# Patient Record
Sex: Female | Born: 1945
Health system: Southern US, Community
[De-identification: ages and names within clinical notes are randomized; demographics above are authoritative.]

## PROBLEM LIST (undated history)

## (undated) DIAGNOSIS — M8580 Other specified disorders of bone density and structure, unspecified site: Secondary | ICD-10-CM

## (undated) DIAGNOSIS — R760 Raised antibody titer: Secondary | ICD-10-CM

## (undated) DIAGNOSIS — R7303 Prediabetes: Secondary | ICD-10-CM

## (undated) DIAGNOSIS — M797 Fibromyalgia: Secondary | ICD-10-CM

## (undated) DIAGNOSIS — G4700 Insomnia, unspecified: Secondary | ICD-10-CM

## (undated) DIAGNOSIS — F32A Depression, unspecified: Secondary | ICD-10-CM

## (undated) DIAGNOSIS — M1990 Unspecified osteoarthritis, unspecified site: Secondary | ICD-10-CM

## (undated) DIAGNOSIS — F419 Anxiety disorder, unspecified: Secondary | ICD-10-CM

## (undated) HISTORY — PX: BUNIONECTOMY: SHX129

## (undated) HISTORY — PX: TONSILLECTOMY: SUR1361

## (undated) HISTORY — PX: BREAST EXCISIONAL BIOPSY: SUR124

## (undated) HISTORY — PX: BREAST BIOPSY: SHX20

---

## 1993-03-08 HISTORY — PX: ABDOMINAL HYSTERECTOMY: SHX81

## 1998-03-08 HISTORY — PX: BUNIONECTOMY: SHX129

## 2011-03-09 HISTORY — PX: REPLACEMENT TOTAL KNEE: SUR1224

## 2012-03-08 HISTORY — PX: EYE SURGERY: SHX253

## 2015-03-17 DIAGNOSIS — M25561 Pain in right knee: Secondary | ICD-10-CM | POA: Diagnosis not present

## 2015-03-17 DIAGNOSIS — M170 Bilateral primary osteoarthritis of knee: Secondary | ICD-10-CM | POA: Diagnosis not present

## 2015-03-17 DIAGNOSIS — M25562 Pain in left knee: Secondary | ICD-10-CM | POA: Diagnosis not present

## 2015-03-17 DIAGNOSIS — M76891 Other specified enthesopathies of right lower limb, excluding foot: Secondary | ICD-10-CM | POA: Diagnosis not present

## 2015-03-18 DIAGNOSIS — R7309 Other abnormal glucose: Secondary | ICD-10-CM | POA: Diagnosis not present

## 2015-03-18 DIAGNOSIS — Z131 Encounter for screening for diabetes mellitus: Secondary | ICD-10-CM | POA: Diagnosis not present

## 2015-03-18 DIAGNOSIS — R740 Nonspecific elevation of levels of transaminase and lactic acid dehydrogenase [LDH]: Secondary | ICD-10-CM | POA: Diagnosis not present

## 2015-03-18 DIAGNOSIS — E669 Obesity, unspecified: Secondary | ICD-10-CM | POA: Diagnosis not present

## 2015-03-18 DIAGNOSIS — M170 Bilateral primary osteoarthritis of knee: Secondary | ICD-10-CM | POA: Diagnosis not present

## 2015-03-18 DIAGNOSIS — Z6831 Body mass index (BMI) 31.0-31.9, adult: Secondary | ICD-10-CM | POA: Diagnosis not present

## 2015-03-18 DIAGNOSIS — E7800 Pure hypercholesterolemia, unspecified: Secondary | ICD-10-CM | POA: Diagnosis not present

## 2015-07-15 DIAGNOSIS — M25562 Pain in left knee: Secondary | ICD-10-CM | POA: Diagnosis not present

## 2015-07-15 DIAGNOSIS — M76891 Other specified enthesopathies of right lower limb, excluding foot: Secondary | ICD-10-CM | POA: Diagnosis not present

## 2015-07-15 DIAGNOSIS — M170 Bilateral primary osteoarthritis of knee: Secondary | ICD-10-CM | POA: Diagnosis not present

## 2015-07-15 DIAGNOSIS — M25561 Pain in right knee: Secondary | ICD-10-CM | POA: Diagnosis not present

## 2015-07-29 DIAGNOSIS — R05 Cough: Secondary | ICD-10-CM | POA: Diagnosis not present

## 2015-07-29 DIAGNOSIS — J069 Acute upper respiratory infection, unspecified: Secondary | ICD-10-CM | POA: Diagnosis not present

## 2015-08-25 DIAGNOSIS — M76891 Other specified enthesopathies of right lower limb, excluding foot: Secondary | ICD-10-CM | POA: Diagnosis not present

## 2015-08-25 DIAGNOSIS — M25561 Pain in right knee: Secondary | ICD-10-CM | POA: Diagnosis not present

## 2015-08-28 DIAGNOSIS — M76891 Other specified enthesopathies of right lower limb, excluding foot: Secondary | ICD-10-CM | POA: Diagnosis not present

## 2015-09-02 DIAGNOSIS — M76891 Other specified enthesopathies of right lower limb, excluding foot: Secondary | ICD-10-CM | POA: Diagnosis not present

## 2015-09-04 DIAGNOSIS — M76891 Other specified enthesopathies of right lower limb, excluding foot: Secondary | ICD-10-CM | POA: Diagnosis not present

## 2015-09-12 DIAGNOSIS — M76891 Other specified enthesopathies of right lower limb, excluding foot: Secondary | ICD-10-CM | POA: Diagnosis not present

## 2015-09-15 DIAGNOSIS — E669 Obesity, unspecified: Secondary | ICD-10-CM | POA: Diagnosis not present

## 2015-09-15 DIAGNOSIS — M76891 Other specified enthesopathies of right lower limb, excluding foot: Secondary | ICD-10-CM | POA: Diagnosis not present

## 2015-09-15 DIAGNOSIS — M170 Bilateral primary osteoarthritis of knee: Secondary | ICD-10-CM | POA: Diagnosis not present

## 2015-09-15 DIAGNOSIS — M25562 Pain in left knee: Secondary | ICD-10-CM | POA: Diagnosis not present

## 2015-09-15 DIAGNOSIS — Z6833 Body mass index (BMI) 33.0-33.9, adult: Secondary | ICD-10-CM | POA: Diagnosis not present

## 2015-09-15 DIAGNOSIS — R740 Nonspecific elevation of levels of transaminase and lactic acid dehydrogenase [LDH]: Secondary | ICD-10-CM | POA: Diagnosis not present

## 2015-09-15 DIAGNOSIS — M25561 Pain in right knee: Secondary | ICD-10-CM | POA: Diagnosis not present

## 2015-09-15 DIAGNOSIS — E7800 Pure hypercholesterolemia, unspecified: Secondary | ICD-10-CM | POA: Diagnosis not present

## 2015-09-16 DIAGNOSIS — M76891 Other specified enthesopathies of right lower limb, excluding foot: Secondary | ICD-10-CM | POA: Diagnosis not present

## 2015-09-19 DIAGNOSIS — M76891 Other specified enthesopathies of right lower limb, excluding foot: Secondary | ICD-10-CM | POA: Diagnosis not present

## 2015-09-23 DIAGNOSIS — M76891 Other specified enthesopathies of right lower limb, excluding foot: Secondary | ICD-10-CM | POA: Diagnosis not present

## 2015-09-26 DIAGNOSIS — M76891 Other specified enthesopathies of right lower limb, excluding foot: Secondary | ICD-10-CM | POA: Diagnosis not present

## 2015-09-30 DIAGNOSIS — M76891 Other specified enthesopathies of right lower limb, excluding foot: Secondary | ICD-10-CM | POA: Diagnosis not present

## 2015-10-03 DIAGNOSIS — M76891 Other specified enthesopathies of right lower limb, excluding foot: Secondary | ICD-10-CM | POA: Diagnosis not present

## 2015-10-07 DIAGNOSIS — M25561 Pain in right knee: Secondary | ICD-10-CM | POA: Diagnosis not present

## 2015-10-07 DIAGNOSIS — M76891 Other specified enthesopathies of right lower limb, excluding foot: Secondary | ICD-10-CM | POA: Diagnosis not present

## 2015-12-15 DIAGNOSIS — Z23 Encounter for immunization: Secondary | ICD-10-CM | POA: Diagnosis not present

## 2015-12-17 DIAGNOSIS — Z713 Dietary counseling and surveillance: Secondary | ICD-10-CM | POA: Diagnosis not present

## 2015-12-17 DIAGNOSIS — Z6833 Body mass index (BMI) 33.0-33.9, adult: Secondary | ICD-10-CM | POA: Diagnosis not present

## 2015-12-17 DIAGNOSIS — R7309 Other abnormal glucose: Secondary | ICD-10-CM | POA: Diagnosis not present

## 2015-12-17 DIAGNOSIS — E782 Mixed hyperlipidemia: Secondary | ICD-10-CM | POA: Diagnosis not present

## 2015-12-17 DIAGNOSIS — M170 Bilateral primary osteoarthritis of knee: Secondary | ICD-10-CM | POA: Diagnosis not present

## 2016-03-04 DIAGNOSIS — Z96659 Presence of unspecified artificial knee joint: Secondary | ICD-10-CM | POA: Diagnosis not present

## 2016-03-04 DIAGNOSIS — Z96652 Presence of left artificial knee joint: Secondary | ICD-10-CM | POA: Diagnosis not present

## 2016-03-04 DIAGNOSIS — M1712 Unilateral primary osteoarthritis, left knee: Secondary | ICD-10-CM | POA: Diagnosis not present

## 2016-03-04 DIAGNOSIS — M7051 Other bursitis of knee, right knee: Secondary | ICD-10-CM | POA: Diagnosis not present

## 2016-03-04 DIAGNOSIS — T8484XA Pain due to internal orthopedic prosthetic devices, implants and grafts, initial encounter: Secondary | ICD-10-CM | POA: Diagnosis not present

## 2016-03-11 DIAGNOSIS — E785 Hyperlipidemia, unspecified: Secondary | ICD-10-CM | POA: Diagnosis not present

## 2016-03-11 DIAGNOSIS — M1990 Unspecified osteoarthritis, unspecified site: Secondary | ICD-10-CM | POA: Diagnosis not present

## 2016-03-11 DIAGNOSIS — Z0000 Encounter for general adult medical examination without abnormal findings: Secondary | ICD-10-CM | POA: Diagnosis not present

## 2016-03-22 DIAGNOSIS — Z13820 Encounter for screening for osteoporosis: Secondary | ICD-10-CM | POA: Diagnosis not present

## 2016-03-22 DIAGNOSIS — Z780 Asymptomatic menopausal state: Secondary | ICD-10-CM | POA: Diagnosis not present

## 2016-03-22 DIAGNOSIS — M8588 Other specified disorders of bone density and structure, other site: Secondary | ICD-10-CM | POA: Diagnosis not present

## 2016-04-16 DIAGNOSIS — Z96651 Presence of right artificial knee joint: Secondary | ICD-10-CM | POA: Diagnosis not present

## 2016-04-16 DIAGNOSIS — M1712 Unilateral primary osteoarthritis, left knee: Secondary | ICD-10-CM | POA: Diagnosis not present

## 2016-04-23 DIAGNOSIS — M25561 Pain in right knee: Secondary | ICD-10-CM | POA: Diagnosis not present

## 2016-04-23 DIAGNOSIS — M1712 Unilateral primary osteoarthritis, left knee: Secondary | ICD-10-CM | POA: Diagnosis not present

## 2016-04-30 DIAGNOSIS — M1712 Unilateral primary osteoarthritis, left knee: Secondary | ICD-10-CM | POA: Diagnosis not present

## 2016-05-10 DIAGNOSIS — E7800 Pure hypercholesterolemia, unspecified: Secondary | ICD-10-CM | POA: Diagnosis not present

## 2016-06-01 DIAGNOSIS — T8484XA Pain due to internal orthopedic prosthetic devices, implants and grafts, initial encounter: Secondary | ICD-10-CM | POA: Diagnosis not present

## 2016-06-01 DIAGNOSIS — M7051 Other bursitis of knee, right knee: Secondary | ICD-10-CM | POA: Diagnosis not present

## 2016-06-01 DIAGNOSIS — Z96651 Presence of right artificial knee joint: Secondary | ICD-10-CM | POA: Diagnosis not present

## 2016-06-01 DIAGNOSIS — M1712 Unilateral primary osteoarthritis, left knee: Secondary | ICD-10-CM | POA: Diagnosis not present

## 2016-06-03 ENCOUNTER — Other Ambulatory Visit (HOSPITAL_COMMUNITY): Payer: Self-pay | Admitting: Orthopedic Surgery

## 2016-06-03 DIAGNOSIS — T8484XA Pain due to internal orthopedic prosthetic devices, implants and grafts, initial encounter: Secondary | ICD-10-CM

## 2016-06-03 DIAGNOSIS — Z96651 Presence of right artificial knee joint: Secondary | ICD-10-CM

## 2016-06-11 ENCOUNTER — Encounter (HOSPITAL_COMMUNITY)
Admission: RE | Admit: 2016-06-11 | Discharge: 2016-06-11 | Disposition: A | Discharge status: Home / Self Care | Payer: Medicare Other | Source: Ambulatory Visit | Attending: Orthopedic Surgery | Admitting: Orthopedic Surgery

## 2016-06-11 ENCOUNTER — Encounter (HOSPITAL_COMMUNITY): Payer: Self-pay | Admitting: Radiology

## 2016-06-11 DIAGNOSIS — T8484XA Pain due to internal orthopedic prosthetic devices, implants and grafts, initial encounter: Secondary | ICD-10-CM | POA: Diagnosis not present

## 2016-06-11 DIAGNOSIS — X58XXXA Exposure to other specified factors, initial encounter: Secondary | ICD-10-CM | POA: Insufficient documentation

## 2016-06-11 DIAGNOSIS — Z96651 Presence of right artificial knee joint: Secondary | ICD-10-CM | POA: Diagnosis not present

## 2016-06-11 DIAGNOSIS — Z471 Aftercare following joint replacement surgery: Secondary | ICD-10-CM | POA: Diagnosis not present

## 2016-06-11 MED ORDER — TECHNETIUM TC 99M MEDRONATE IV KIT
25.0000 | PACK | Freq: Once | INTRAVENOUS | Status: AC | PRN
  Administered 2016-06-11: 25 via INTRAVENOUS

## 2016-07-05 DIAGNOSIS — E663 Overweight: Secondary | ICD-10-CM | POA: Diagnosis not present

## 2016-07-05 DIAGNOSIS — Z8659 Personal history of other mental and behavioral disorders: Secondary | ICD-10-CM | POA: Diagnosis not present

## 2016-07-05 DIAGNOSIS — M1990 Unspecified osteoarthritis, unspecified site: Secondary | ICD-10-CM | POA: Diagnosis not present

## 2016-07-05 DIAGNOSIS — E785 Hyperlipidemia, unspecified: Secondary | ICD-10-CM | POA: Diagnosis not present

## 2016-11-29 DIAGNOSIS — Z23 Encounter for immunization: Secondary | ICD-10-CM | POA: Diagnosis not present

## 2016-12-24 DIAGNOSIS — M1712 Unilateral primary osteoarthritis, left knee: Secondary | ICD-10-CM | POA: Diagnosis not present

## 2016-12-31 DIAGNOSIS — M1712 Unilateral primary osteoarthritis, left knee: Secondary | ICD-10-CM | POA: Diagnosis not present

## 2017-03-22 DIAGNOSIS — Z8659 Personal history of other mental and behavioral disorders: Secondary | ICD-10-CM | POA: Diagnosis not present

## 2017-03-22 DIAGNOSIS — E785 Hyperlipidemia, unspecified: Secondary | ICD-10-CM | POA: Diagnosis not present

## 2017-03-22 DIAGNOSIS — M1990 Unspecified osteoarthritis, unspecified site: Secondary | ICD-10-CM | POA: Diagnosis not present

## 2017-04-14 DIAGNOSIS — J069 Acute upper respiratory infection, unspecified: Secondary | ICD-10-CM | POA: Diagnosis not present

## 2017-07-14 DIAGNOSIS — M1712 Unilateral primary osteoarthritis, left knee: Secondary | ICD-10-CM | POA: Diagnosis not present

## 2017-07-14 DIAGNOSIS — M25562 Pain in left knee: Secondary | ICD-10-CM | POA: Diagnosis not present

## 2017-07-21 DIAGNOSIS — M1712 Unilateral primary osteoarthritis, left knee: Secondary | ICD-10-CM | POA: Diagnosis not present

## 2017-07-29 DIAGNOSIS — M170 Bilateral primary osteoarthritis of knee: Secondary | ICD-10-CM | POA: Diagnosis not present

## 2017-08-02 DIAGNOSIS — F4323 Adjustment disorder with mixed anxiety and depressed mood: Secondary | ICD-10-CM | POA: Diagnosis not present

## 2017-09-20 ENCOUNTER — Other Ambulatory Visit: Payer: Self-pay | Admitting: Family Medicine

## 2017-09-20 ENCOUNTER — Ambulatory Visit
Admission: RE | Admit: 2017-09-20 | Discharge: 2017-09-20 | Disposition: A | Discharge status: Home / Self Care | Payer: Medicare Other | Source: Ambulatory Visit | Attending: Family Medicine | Admitting: Family Medicine

## 2017-09-20 DIAGNOSIS — R011 Cardiac murmur, unspecified: Secondary | ICD-10-CM | POA: Diagnosis not present

## 2017-09-20 DIAGNOSIS — M1990 Unspecified osteoarthritis, unspecified site: Secondary | ICD-10-CM | POA: Diagnosis not present

## 2017-09-20 DIAGNOSIS — E785 Hyperlipidemia, unspecified: Secondary | ICD-10-CM | POA: Diagnosis not present

## 2017-09-20 DIAGNOSIS — F321 Major depressive disorder, single episode, moderate: Secondary | ICD-10-CM | POA: Diagnosis not present

## 2017-09-20 DIAGNOSIS — R52 Pain, unspecified: Secondary | ICD-10-CM

## 2017-09-20 DIAGNOSIS — S99921A Unspecified injury of right foot, initial encounter: Secondary | ICD-10-CM | POA: Diagnosis not present

## 2017-09-20 DIAGNOSIS — M79671 Pain in right foot: Secondary | ICD-10-CM | POA: Diagnosis not present

## 2017-11-14 DIAGNOSIS — Z23 Encounter for immunization: Secondary | ICD-10-CM | POA: Diagnosis not present

## 2017-12-26 DIAGNOSIS — M79671 Pain in right foot: Secondary | ICD-10-CM | POA: Diagnosis not present

## 2017-12-26 DIAGNOSIS — M79641 Pain in right hand: Secondary | ICD-10-CM | POA: Diagnosis not present

## 2018-02-01 DIAGNOSIS — M1712 Unilateral primary osteoarthritis, left knee: Secondary | ICD-10-CM | POA: Diagnosis not present

## 2018-02-08 DIAGNOSIS — M1712 Unilateral primary osteoarthritis, left knee: Secondary | ICD-10-CM | POA: Diagnosis not present

## 2018-02-15 DIAGNOSIS — M1712 Unilateral primary osteoarthritis, left knee: Secondary | ICD-10-CM | POA: Diagnosis not present

## 2018-03-24 DIAGNOSIS — R011 Cardiac murmur, unspecified: Secondary | ICD-10-CM | POA: Diagnosis not present

## 2018-03-24 DIAGNOSIS — Z23 Encounter for immunization: Secondary | ICD-10-CM | POA: Diagnosis not present

## 2018-03-24 DIAGNOSIS — Z1211 Encounter for screening for malignant neoplasm of colon: Secondary | ICD-10-CM | POA: Diagnosis not present

## 2018-03-24 DIAGNOSIS — Z0000 Encounter for general adult medical examination without abnormal findings: Secondary | ICD-10-CM | POA: Diagnosis not present

## 2018-03-24 DIAGNOSIS — E669 Obesity, unspecified: Secondary | ICD-10-CM | POA: Diagnosis not present

## 2018-03-24 DIAGNOSIS — M8580 Other specified disorders of bone density and structure, unspecified site: Secondary | ICD-10-CM | POA: Diagnosis not present

## 2018-03-24 DIAGNOSIS — F321 Major depressive disorder, single episode, moderate: Secondary | ICD-10-CM | POA: Diagnosis not present

## 2018-03-24 DIAGNOSIS — E785 Hyperlipidemia, unspecified: Secondary | ICD-10-CM | POA: Diagnosis not present

## 2018-03-24 DIAGNOSIS — Z1389 Encounter for screening for other disorder: Secondary | ICD-10-CM | POA: Diagnosis not present

## 2018-03-24 DIAGNOSIS — M1990 Unspecified osteoarthritis, unspecified site: Secondary | ICD-10-CM | POA: Diagnosis not present

## 2018-03-24 DIAGNOSIS — E2839 Other primary ovarian failure: Secondary | ICD-10-CM | POA: Diagnosis not present

## 2018-03-27 ENCOUNTER — Other Ambulatory Visit: Payer: Self-pay | Admitting: Family Medicine

## 2018-03-27 DIAGNOSIS — Z1231 Encounter for screening mammogram for malignant neoplasm of breast: Secondary | ICD-10-CM

## 2018-03-27 DIAGNOSIS — E2839 Other primary ovarian failure: Secondary | ICD-10-CM

## 2018-04-03 DIAGNOSIS — M6740 Ganglion, unspecified site: Secondary | ICD-10-CM | POA: Diagnosis not present

## 2018-04-11 DIAGNOSIS — M19012 Primary osteoarthritis, left shoulder: Secondary | ICD-10-CM | POA: Diagnosis not present

## 2018-04-11 DIAGNOSIS — M25512 Pain in left shoulder: Secondary | ICD-10-CM | POA: Diagnosis not present

## 2018-06-02 ENCOUNTER — Other Ambulatory Visit: Payer: Medicare Other

## 2018-06-02 ENCOUNTER — Ambulatory Visit: Payer: Medicare Other

## 2018-08-07 ENCOUNTER — Other Ambulatory Visit: Payer: Self-pay

## 2018-08-07 ENCOUNTER — Ambulatory Visit
Admission: RE | Admit: 2018-08-07 | Discharge: 2018-08-07 | Disposition: A | Discharge status: Home / Self Care | Payer: Medicare Other | Source: Ambulatory Visit | Attending: Family Medicine | Admitting: Family Medicine

## 2018-08-07 ENCOUNTER — Ambulatory Visit: Payer: Medicare Other

## 2018-08-07 DIAGNOSIS — Z1231 Encounter for screening mammogram for malignant neoplasm of breast: Secondary | ICD-10-CM

## 2018-08-07 DIAGNOSIS — E2839 Other primary ovarian failure: Secondary | ICD-10-CM

## 2018-08-07 DIAGNOSIS — Z780 Asymptomatic menopausal state: Secondary | ICD-10-CM | POA: Diagnosis not present

## 2018-08-07 DIAGNOSIS — M8589 Other specified disorders of bone density and structure, multiple sites: Secondary | ICD-10-CM | POA: Diagnosis not present

## 2018-10-04 DIAGNOSIS — M1712 Unilateral primary osteoarthritis, left knee: Secondary | ICD-10-CM | POA: Diagnosis not present

## 2018-10-06 DIAGNOSIS — M8580 Other specified disorders of bone density and structure, unspecified site: Secondary | ICD-10-CM | POA: Diagnosis not present

## 2018-10-06 DIAGNOSIS — E785 Hyperlipidemia, unspecified: Secondary | ICD-10-CM | POA: Diagnosis not present

## 2018-10-06 DIAGNOSIS — M1990 Unspecified osteoarthritis, unspecified site: Secondary | ICD-10-CM | POA: Diagnosis not present

## 2018-10-06 DIAGNOSIS — F321 Major depressive disorder, single episode, moderate: Secondary | ICD-10-CM | POA: Diagnosis not present

## 2018-10-06 DIAGNOSIS — E669 Obesity, unspecified: Secondary | ICD-10-CM | POA: Diagnosis not present

## 2018-10-11 DIAGNOSIS — M1712 Unilateral primary osteoarthritis, left knee: Secondary | ICD-10-CM | POA: Diagnosis not present

## 2018-10-11 DIAGNOSIS — M25562 Pain in left knee: Secondary | ICD-10-CM | POA: Diagnosis not present

## 2018-10-18 DIAGNOSIS — M25562 Pain in left knee: Secondary | ICD-10-CM | POA: Diagnosis not present

## 2018-10-18 DIAGNOSIS — M1712 Unilateral primary osteoarthritis, left knee: Secondary | ICD-10-CM | POA: Diagnosis not present

## 2018-11-15 DIAGNOSIS — Z23 Encounter for immunization: Secondary | ICD-10-CM | POA: Diagnosis not present

## 2019-02-09 DIAGNOSIS — F321 Major depressive disorder, single episode, moderate: Secondary | ICD-10-CM | POA: Diagnosis not present

## 2019-02-09 DIAGNOSIS — E7800 Pure hypercholesterolemia, unspecified: Secondary | ICD-10-CM | POA: Diagnosis not present

## 2019-02-09 DIAGNOSIS — F324 Major depressive disorder, single episode, in partial remission: Secondary | ICD-10-CM | POA: Diagnosis not present

## 2019-02-09 DIAGNOSIS — M8580 Other specified disorders of bone density and structure, unspecified site: Secondary | ICD-10-CM | POA: Diagnosis not present

## 2019-02-09 DIAGNOSIS — M1990 Unspecified osteoarthritis, unspecified site: Secondary | ICD-10-CM | POA: Diagnosis not present

## 2019-02-09 DIAGNOSIS — E785 Hyperlipidemia, unspecified: Secondary | ICD-10-CM | POA: Diagnosis not present

## 2019-03-05 ENCOUNTER — Ambulatory Visit: Payer: Medicare Other | Attending: Internal Medicine

## 2019-03-05 DIAGNOSIS — U071 COVID-19: Secondary | ICD-10-CM

## 2019-03-05 DIAGNOSIS — R238 Other skin changes: Secondary | ICD-10-CM

## 2019-03-07 LAB — NOVEL CORONAVIRUS, NAA: SARS-CoV-2, NAA: NOT DETECTED

## 2019-04-05 ENCOUNTER — Ambulatory Visit: Payer: Medicare Other

## 2019-04-11 DIAGNOSIS — H04122 Dry eye syndrome of left lacrimal gland: Secondary | ICD-10-CM | POA: Diagnosis not present

## 2019-04-13 ENCOUNTER — Ambulatory Visit: Payer: Medicare Other | Attending: Internal Medicine

## 2019-04-13 DIAGNOSIS — Z23 Encounter for immunization: Secondary | ICD-10-CM | POA: Insufficient documentation

## 2019-04-13 NOTE — Progress Notes (Signed)
   Covid-19 Vaccination Clinic  Name:  Morgan Reed    MRN: [id number] DOB: 1946-01-04  04/13/2019  [name] was observed post Covid-19 immunization for 15 minutes without incidence. She was provided with Vaccine Information Sheet and instruction to access the V-Safe system.   [name] was instructed to call 911 with any severe reactions post vaccine: Marland Kitchen Difficulty breathing  . Swelling of your face and throat  . A fast heartbeat  . A bad rash all over your body  . Dizziness and weakness    Immunizations Administered    Name Date Dose VIS Date Route   Pfizer COVID-19 Vaccine 04/13/2019  5:46 PM 0.3 mL 02/16/2019 Intramuscular   Manufacturer: [employer], [employer]   Lot: [id number]   NDC: [address]-7

## 2019-04-20 DIAGNOSIS — M1712 Unilateral primary osteoarthritis, left knee: Secondary | ICD-10-CM | POA: Diagnosis not present

## 2019-04-20 DIAGNOSIS — M25561 Pain in right knee: Secondary | ICD-10-CM | POA: Diagnosis not present

## 2019-04-20 DIAGNOSIS — M25562 Pain in left knee: Secondary | ICD-10-CM | POA: Diagnosis not present

## 2019-04-26 ENCOUNTER — Ambulatory Visit: Payer: Medicare Other

## 2019-04-27 DIAGNOSIS — M1712 Unilateral primary osteoarthritis, left knee: Secondary | ICD-10-CM | POA: Diagnosis not present

## 2019-04-27 DIAGNOSIS — M25562 Pain in left knee: Secondary | ICD-10-CM | POA: Diagnosis not present

## 2019-04-27 DIAGNOSIS — M1711 Unilateral primary osteoarthritis, right knee: Secondary | ICD-10-CM | POA: Diagnosis not present

## 2019-04-27 DIAGNOSIS — M25561 Pain in right knee: Secondary | ICD-10-CM | POA: Diagnosis not present

## 2019-05-04 DIAGNOSIS — M1712 Unilateral primary osteoarthritis, left knee: Secondary | ICD-10-CM | POA: Diagnosis not present

## 2019-05-04 DIAGNOSIS — M25562 Pain in left knee: Secondary | ICD-10-CM | POA: Diagnosis not present

## 2019-05-08 ENCOUNTER — Ambulatory Visit: Payer: Medicare Other

## 2019-05-08 ENCOUNTER — Ambulatory Visit: Payer: Medicare Other | Attending: Internal Medicine

## 2019-05-08 DIAGNOSIS — Z23 Encounter for immunization: Secondary | ICD-10-CM

## 2019-05-08 NOTE — Progress Notes (Signed)
   Covid-19 Vaccination Clinic  Name:  Morgan Reed    MRN: [id number] DOB: 20-Aug-1945  05/08/2019  [name] was observed post Covid-19 immunization for 15 minutes without incident. She was provided with Vaccine Information Sheet and instruction to access the V-Safe system.   Ms. Gutter was instructed to call 911 with any severe reactions post vaccine: Marland Kitchen Difficulty breathing  . Swelling of face and throat  . A fast heartbeat  . A bad rash all over body  . Dizziness and weakness   Immunizations Administered    Name Date Dose VIS Date Route   Pfizer COVID-19 Vaccine 05/08/2019 10:19 AM 0.3 mL 02/16/2019 Intramuscular   Manufacturer: [employer], [employer]   Lot: [id number]   NDC: [address]-3

## 2019-06-20 DIAGNOSIS — H0011 Chalazion right upper eyelid: Secondary | ICD-10-CM | POA: Diagnosis not present

## 2019-06-27 DIAGNOSIS — M1990 Unspecified osteoarthritis, unspecified site: Secondary | ICD-10-CM | POA: Diagnosis not present

## 2019-06-27 DIAGNOSIS — Z0000 Encounter for general adult medical examination without abnormal findings: Secondary | ICD-10-CM | POA: Diagnosis not present

## 2019-06-27 DIAGNOSIS — G4700 Insomnia, unspecified: Secondary | ICD-10-CM | POA: Diagnosis not present

## 2019-06-27 DIAGNOSIS — F321 Major depressive disorder, single episode, moderate: Secondary | ICD-10-CM | POA: Diagnosis not present

## 2019-06-27 DIAGNOSIS — E785 Hyperlipidemia, unspecified: Secondary | ICD-10-CM | POA: Diagnosis not present

## 2019-06-27 DIAGNOSIS — M8580 Other specified disorders of bone density and structure, unspecified site: Secondary | ICD-10-CM | POA: Diagnosis not present

## 2019-06-27 DIAGNOSIS — Z1389 Encounter for screening for other disorder: Secondary | ICD-10-CM | POA: Diagnosis not present

## 2019-07-04 DIAGNOSIS — Z1211 Encounter for screening for malignant neoplasm of colon: Secondary | ICD-10-CM | POA: Diagnosis not present

## 2019-07-10 DIAGNOSIS — H0011 Chalazion right upper eyelid: Secondary | ICD-10-CM | POA: Diagnosis not present

## 2019-07-12 DIAGNOSIS — R944 Abnormal results of kidney function studies: Secondary | ICD-10-CM | POA: Diagnosis not present

## 2019-07-12 DIAGNOSIS — Z23 Encounter for immunization: Secondary | ICD-10-CM | POA: Diagnosis not present

## 2019-08-09 ENCOUNTER — Other Ambulatory Visit: Payer: Self-pay | Admitting: Family Medicine

## 2019-08-09 DIAGNOSIS — Z1231 Encounter for screening mammogram for malignant neoplasm of breast: Secondary | ICD-10-CM

## 2019-08-15 ENCOUNTER — Other Ambulatory Visit: Payer: Self-pay

## 2019-08-15 ENCOUNTER — Ambulatory Visit
Admission: RE | Admit: 2019-08-15 | Discharge: 2019-08-15 | Disposition: A | Discharge status: Home / Self Care | Payer: Medicare Other | Source: Ambulatory Visit | Attending: Family Medicine | Admitting: Family Medicine

## 2019-08-15 DIAGNOSIS — Z1231 Encounter for screening mammogram for malignant neoplasm of breast: Secondary | ICD-10-CM | POA: Diagnosis not present

## 2019-08-20 DIAGNOSIS — H0014 Chalazion left upper eyelid: Secondary | ICD-10-CM | POA: Diagnosis not present

## 2019-08-20 DIAGNOSIS — H01004 Unspecified blepharitis left upper eyelid: Secondary | ICD-10-CM | POA: Diagnosis not present

## 2019-08-20 DIAGNOSIS — H01001 Unspecified blepharitis right upper eyelid: Secondary | ICD-10-CM | POA: Diagnosis not present

## 2019-09-21 DIAGNOSIS — Z96651 Presence of right artificial knee joint: Secondary | ICD-10-CM | POA: Diagnosis not present

## 2019-09-21 DIAGNOSIS — M25561 Pain in right knee: Secondary | ICD-10-CM | POA: Diagnosis not present

## 2019-10-05 DIAGNOSIS — E785 Hyperlipidemia, unspecified: Secondary | ICD-10-CM | POA: Diagnosis not present

## 2019-10-05 DIAGNOSIS — M1990 Unspecified osteoarthritis, unspecified site: Secondary | ICD-10-CM | POA: Diagnosis not present

## 2019-10-05 DIAGNOSIS — F321 Major depressive disorder, single episode, moderate: Secondary | ICD-10-CM | POA: Diagnosis not present

## 2019-10-05 DIAGNOSIS — F324 Major depressive disorder, single episode, in partial remission: Secondary | ICD-10-CM | POA: Diagnosis not present

## 2019-10-05 DIAGNOSIS — E7800 Pure hypercholesterolemia, unspecified: Secondary | ICD-10-CM | POA: Diagnosis not present

## 2019-10-05 DIAGNOSIS — M8580 Other specified disorders of bone density and structure, unspecified site: Secondary | ICD-10-CM | POA: Diagnosis not present

## 2019-10-30 DIAGNOSIS — N289 Disorder of kidney and ureter, unspecified: Secondary | ICD-10-CM | POA: Diagnosis not present

## 2019-10-30 DIAGNOSIS — Z23 Encounter for immunization: Secondary | ICD-10-CM | POA: Diagnosis not present

## 2019-11-07 DIAGNOSIS — M1712 Unilateral primary osteoarthritis, left knee: Secondary | ICD-10-CM | POA: Diagnosis not present

## 2019-11-14 DIAGNOSIS — M25562 Pain in left knee: Secondary | ICD-10-CM | POA: Diagnosis not present

## 2019-11-14 DIAGNOSIS — M1712 Unilateral primary osteoarthritis, left knee: Secondary | ICD-10-CM | POA: Diagnosis not present

## 2019-11-19 DIAGNOSIS — Z23 Encounter for immunization: Secondary | ICD-10-CM | POA: Diagnosis not present

## 2019-11-21 DIAGNOSIS — M25562 Pain in left knee: Secondary | ICD-10-CM | POA: Diagnosis not present

## 2019-11-21 DIAGNOSIS — M1712 Unilateral primary osteoarthritis, left knee: Secondary | ICD-10-CM | POA: Diagnosis not present

## 2019-11-29 DIAGNOSIS — F324 Major depressive disorder, single episode, in partial remission: Secondary | ICD-10-CM | POA: Diagnosis not present

## 2019-11-29 DIAGNOSIS — E785 Hyperlipidemia, unspecified: Secondary | ICD-10-CM | POA: Diagnosis not present

## 2019-11-29 DIAGNOSIS — M1990 Unspecified osteoarthritis, unspecified site: Secondary | ICD-10-CM | POA: Diagnosis not present

## 2019-11-29 DIAGNOSIS — M8580 Other specified disorders of bone density and structure, unspecified site: Secondary | ICD-10-CM | POA: Diagnosis not present

## 2019-11-29 DIAGNOSIS — F321 Major depressive disorder, single episode, moderate: Secondary | ICD-10-CM | POA: Diagnosis not present

## 2019-11-29 DIAGNOSIS — E7800 Pure hypercholesterolemia, unspecified: Secondary | ICD-10-CM | POA: Diagnosis not present

## 2019-12-03 DIAGNOSIS — Z23 Encounter for immunization: Secondary | ICD-10-CM | POA: Diagnosis not present

## 2019-12-26 DIAGNOSIS — M25561 Pain in right knee: Secondary | ICD-10-CM | POA: Diagnosis not present

## 2019-12-26 DIAGNOSIS — Z96651 Presence of right artificial knee joint: Secondary | ICD-10-CM | POA: Diagnosis not present

## 2019-12-26 DIAGNOSIS — M2241 Chondromalacia patellae, right knee: Secondary | ICD-10-CM | POA: Diagnosis not present

## 2019-12-26 DIAGNOSIS — T8484XA Pain due to internal orthopedic prosthetic devices, implants and grafts, initial encounter: Secondary | ICD-10-CM | POA: Diagnosis not present

## 2020-02-04 DIAGNOSIS — Z20828 Contact with and (suspected) exposure to other viral communicable diseases: Secondary | ICD-10-CM | POA: Diagnosis not present

## 2020-02-04 DIAGNOSIS — B349 Viral infection, unspecified: Secondary | ICD-10-CM | POA: Diagnosis not present

## 2020-02-20 DIAGNOSIS — G4700 Insomnia, unspecified: Secondary | ICD-10-CM | POA: Diagnosis not present

## 2020-02-20 DIAGNOSIS — E785 Hyperlipidemia, unspecified: Secondary | ICD-10-CM | POA: Diagnosis not present

## 2020-02-20 DIAGNOSIS — E669 Obesity, unspecified: Secondary | ICD-10-CM | POA: Diagnosis not present

## 2020-02-20 DIAGNOSIS — M1990 Unspecified osteoarthritis, unspecified site: Secondary | ICD-10-CM | POA: Diagnosis not present

## 2020-02-20 DIAGNOSIS — F321 Major depressive disorder, single episode, moderate: Secondary | ICD-10-CM | POA: Diagnosis not present

## 2020-03-04 DIAGNOSIS — G4700 Insomnia, unspecified: Secondary | ICD-10-CM | POA: Diagnosis not present

## 2020-03-04 DIAGNOSIS — E7800 Pure hypercholesterolemia, unspecified: Secondary | ICD-10-CM | POA: Diagnosis not present

## 2020-03-04 DIAGNOSIS — M8580 Other specified disorders of bone density and structure, unspecified site: Secondary | ICD-10-CM | POA: Diagnosis not present

## 2020-03-04 DIAGNOSIS — E785 Hyperlipidemia, unspecified: Secondary | ICD-10-CM | POA: Diagnosis not present

## 2020-03-04 DIAGNOSIS — M1990 Unspecified osteoarthritis, unspecified site: Secondary | ICD-10-CM | POA: Diagnosis not present

## 2020-03-04 DIAGNOSIS — F324 Major depressive disorder, single episode, in partial remission: Secondary | ICD-10-CM | POA: Diagnosis not present

## 2020-03-04 DIAGNOSIS — F321 Major depressive disorder, single episode, moderate: Secondary | ICD-10-CM | POA: Diagnosis not present

## 2020-03-26 DIAGNOSIS — Z961 Presence of intraocular lens: Secondary | ICD-10-CM | POA: Diagnosis not present

## 2020-03-26 DIAGNOSIS — H01001 Unspecified blepharitis right upper eyelid: Secondary | ICD-10-CM | POA: Diagnosis not present

## 2020-03-26 DIAGNOSIS — H5211 Myopia, right eye: Secondary | ICD-10-CM | POA: Diagnosis not present

## 2020-03-26 DIAGNOSIS — H26493 Other secondary cataract, bilateral: Secondary | ICD-10-CM | POA: Diagnosis not present

## 2020-04-03 DIAGNOSIS — M170 Bilateral primary osteoarthritis of knee: Secondary | ICD-10-CM | POA: Diagnosis not present

## 2020-04-03 DIAGNOSIS — M1712 Unilateral primary osteoarthritis, left knee: Secondary | ICD-10-CM | POA: Diagnosis not present

## 2020-04-03 DIAGNOSIS — M25552 Pain in left hip: Secondary | ICD-10-CM | POA: Diagnosis not present

## 2020-04-03 DIAGNOSIS — M25562 Pain in left knee: Secondary | ICD-10-CM | POA: Diagnosis not present

## 2020-05-08 DIAGNOSIS — F324 Major depressive disorder, single episode, in partial remission: Secondary | ICD-10-CM | POA: Diagnosis not present

## 2020-05-08 DIAGNOSIS — M8580 Other specified disorders of bone density and structure, unspecified site: Secondary | ICD-10-CM | POA: Diagnosis not present

## 2020-05-08 DIAGNOSIS — E7800 Pure hypercholesterolemia, unspecified: Secondary | ICD-10-CM | POA: Diagnosis not present

## 2020-05-08 DIAGNOSIS — M1990 Unspecified osteoarthritis, unspecified site: Secondary | ICD-10-CM | POA: Diagnosis not present

## 2020-05-08 DIAGNOSIS — G4700 Insomnia, unspecified: Secondary | ICD-10-CM | POA: Diagnosis not present

## 2020-05-08 DIAGNOSIS — E785 Hyperlipidemia, unspecified: Secondary | ICD-10-CM | POA: Diagnosis not present

## 2020-05-21 DIAGNOSIS — M1712 Unilateral primary osteoarthritis, left knee: Secondary | ICD-10-CM | POA: Diagnosis not present

## 2020-05-21 DIAGNOSIS — M25562 Pain in left knee: Secondary | ICD-10-CM | POA: Diagnosis not present

## 2020-05-28 DIAGNOSIS — M1712 Unilateral primary osteoarthritis, left knee: Secondary | ICD-10-CM | POA: Diagnosis not present

## 2020-06-04 DIAGNOSIS — M25562 Pain in left knee: Secondary | ICD-10-CM | POA: Diagnosis not present

## 2020-06-04 DIAGNOSIS — M1712 Unilateral primary osteoarthritis, left knee: Secondary | ICD-10-CM | POA: Diagnosis not present

## 2020-06-27 DIAGNOSIS — F321 Major depressive disorder, single episode, moderate: Secondary | ICD-10-CM | POA: Diagnosis not present

## 2020-06-27 DIAGNOSIS — Z0000 Encounter for general adult medical examination without abnormal findings: Secondary | ICD-10-CM | POA: Diagnosis not present

## 2020-06-27 DIAGNOSIS — E2839 Other primary ovarian failure: Secondary | ICD-10-CM | POA: Diagnosis not present

## 2020-06-27 DIAGNOSIS — Z1389 Encounter for screening for other disorder: Secondary | ICD-10-CM | POA: Diagnosis not present

## 2020-06-27 DIAGNOSIS — Z1211 Encounter for screening for malignant neoplasm of colon: Secondary | ICD-10-CM | POA: Diagnosis not present

## 2020-06-27 DIAGNOSIS — E669 Obesity, unspecified: Secondary | ICD-10-CM | POA: Diagnosis not present

## 2020-06-27 DIAGNOSIS — M8580 Other specified disorders of bone density and structure, unspecified site: Secondary | ICD-10-CM | POA: Diagnosis not present

## 2020-06-27 DIAGNOSIS — G4700 Insomnia, unspecified: Secondary | ICD-10-CM | POA: Diagnosis not present

## 2020-06-27 DIAGNOSIS — M1990 Unspecified osteoarthritis, unspecified site: Secondary | ICD-10-CM | POA: Diagnosis not present

## 2020-06-27 DIAGNOSIS — E785 Hyperlipidemia, unspecified: Secondary | ICD-10-CM | POA: Diagnosis not present

## 2020-06-30 DIAGNOSIS — Z1211 Encounter for screening for malignant neoplasm of colon: Secondary | ICD-10-CM | POA: Diagnosis not present

## 2020-06-30 DIAGNOSIS — Z23 Encounter for immunization: Secondary | ICD-10-CM | POA: Diagnosis not present

## 2020-07-01 ENCOUNTER — Other Ambulatory Visit: Payer: Self-pay | Admitting: Family Medicine

## 2020-07-01 DIAGNOSIS — E2839 Other primary ovarian failure: Secondary | ICD-10-CM

## 2020-07-02 DIAGNOSIS — M170 Bilateral primary osteoarthritis of knee: Secondary | ICD-10-CM | POA: Diagnosis not present

## 2020-07-07 DIAGNOSIS — F324 Major depressive disorder, single episode, in partial remission: Secondary | ICD-10-CM | POA: Diagnosis not present

## 2020-07-07 DIAGNOSIS — E785 Hyperlipidemia, unspecified: Secondary | ICD-10-CM | POA: Diagnosis not present

## 2020-07-07 DIAGNOSIS — M8580 Other specified disorders of bone density and structure, unspecified site: Secondary | ICD-10-CM | POA: Diagnosis not present

## 2020-07-07 DIAGNOSIS — G4700 Insomnia, unspecified: Secondary | ICD-10-CM | POA: Diagnosis not present

## 2020-07-07 DIAGNOSIS — M1990 Unspecified osteoarthritis, unspecified site: Secondary | ICD-10-CM | POA: Diagnosis not present

## 2020-07-07 DIAGNOSIS — E7800 Pure hypercholesterolemia, unspecified: Secondary | ICD-10-CM | POA: Diagnosis not present

## 2020-07-10 DIAGNOSIS — Z1211 Encounter for screening for malignant neoplasm of colon: Secondary | ICD-10-CM | POA: Diagnosis not present

## 2020-07-17 DIAGNOSIS — R7401 Elevation of levels of liver transaminase levels: Secondary | ICD-10-CM | POA: Diagnosis not present

## 2020-07-24 ENCOUNTER — Other Ambulatory Visit: Payer: Self-pay | Admitting: Family Medicine

## 2020-07-24 DIAGNOSIS — Z1231 Encounter for screening mammogram for malignant neoplasm of breast: Secondary | ICD-10-CM

## 2020-08-20 DIAGNOSIS — E785 Hyperlipidemia, unspecified: Secondary | ICD-10-CM | POA: Diagnosis not present

## 2020-08-20 DIAGNOSIS — G4700 Insomnia, unspecified: Secondary | ICD-10-CM | POA: Diagnosis not present

## 2020-08-20 DIAGNOSIS — F324 Major depressive disorder, single episode, in partial remission: Secondary | ICD-10-CM | POA: Diagnosis not present

## 2020-08-20 DIAGNOSIS — M8580 Other specified disorders of bone density and structure, unspecified site: Secondary | ICD-10-CM | POA: Diagnosis not present

## 2020-08-20 DIAGNOSIS — M1990 Unspecified osteoarthritis, unspecified site: Secondary | ICD-10-CM | POA: Diagnosis not present

## 2020-08-20 DIAGNOSIS — E7800 Pure hypercholesterolemia, unspecified: Secondary | ICD-10-CM | POA: Diagnosis not present

## 2020-08-20 DIAGNOSIS — F321 Major depressive disorder, single episode, moderate: Secondary | ICD-10-CM | POA: Diagnosis not present

## 2020-09-11 DIAGNOSIS — M1711 Unilateral primary osteoarthritis, right knee: Secondary | ICD-10-CM | POA: Diagnosis not present

## 2020-09-11 DIAGNOSIS — M1712 Unilateral primary osteoarthritis, left knee: Secondary | ICD-10-CM | POA: Diagnosis not present

## 2020-09-19 ENCOUNTER — Other Ambulatory Visit: Payer: Self-pay

## 2020-09-19 ENCOUNTER — Ambulatory Visit
Admission: RE | Admit: 2020-09-19 | Discharge: 2020-09-19 | Disposition: A | Discharge status: Home / Self Care | Payer: Medicare Other | Source: Ambulatory Visit

## 2020-09-19 DIAGNOSIS — Z1231 Encounter for screening mammogram for malignant neoplasm of breast: Secondary | ICD-10-CM | POA: Diagnosis not present

## 2020-09-24 ENCOUNTER — Other Ambulatory Visit: Payer: Self-pay | Admitting: Orthopedic Surgery

## 2020-09-24 DIAGNOSIS — Z01818 Encounter for other preprocedural examination: Secondary | ICD-10-CM

## 2020-09-25 DIAGNOSIS — M1990 Unspecified osteoarthritis, unspecified site: Secondary | ICD-10-CM | POA: Diagnosis not present

## 2020-09-25 DIAGNOSIS — F324 Major depressive disorder, single episode, in partial remission: Secondary | ICD-10-CM | POA: Diagnosis not present

## 2020-09-25 DIAGNOSIS — F321 Major depressive disorder, single episode, moderate: Secondary | ICD-10-CM | POA: Diagnosis not present

## 2020-09-25 DIAGNOSIS — E7800 Pure hypercholesterolemia, unspecified: Secondary | ICD-10-CM | POA: Diagnosis not present

## 2020-09-25 DIAGNOSIS — E785 Hyperlipidemia, unspecified: Secondary | ICD-10-CM | POA: Diagnosis not present

## 2020-09-25 DIAGNOSIS — G4700 Insomnia, unspecified: Secondary | ICD-10-CM | POA: Diagnosis not present

## 2020-09-25 DIAGNOSIS — M8580 Other specified disorders of bone density and structure, unspecified site: Secondary | ICD-10-CM | POA: Diagnosis not present

## 2020-10-09 DIAGNOSIS — F321 Major depressive disorder, single episode, moderate: Secondary | ICD-10-CM | POA: Diagnosis not present

## 2020-10-09 DIAGNOSIS — M1990 Unspecified osteoarthritis, unspecified site: Secondary | ICD-10-CM | POA: Diagnosis not present

## 2020-10-09 DIAGNOSIS — F324 Major depressive disorder, single episode, in partial remission: Secondary | ICD-10-CM | POA: Diagnosis not present

## 2020-10-09 DIAGNOSIS — E785 Hyperlipidemia, unspecified: Secondary | ICD-10-CM | POA: Diagnosis not present

## 2020-10-09 DIAGNOSIS — E7800 Pure hypercholesterolemia, unspecified: Secondary | ICD-10-CM | POA: Diagnosis not present

## 2020-10-09 DIAGNOSIS — M8580 Other specified disorders of bone density and structure, unspecified site: Secondary | ICD-10-CM | POA: Diagnosis not present

## 2020-10-09 DIAGNOSIS — G4700 Insomnia, unspecified: Secondary | ICD-10-CM | POA: Diagnosis not present

## 2020-10-22 ENCOUNTER — Other Ambulatory Visit (HOSPITAL_COMMUNITY): Payer: Self-pay

## 2020-10-23 NOTE — Patient Instructions (Addendum)
DUE TO COVID-19 ONLY ONE VISITOR IS ALLOWED TO COME WITH YOU AND STAY IN THE WAITING ROOM ONLY DURING PRE OP AND PROCEDURE DAY OF SURGERY. THE 2 VISITORS  MAY VISIT WITH YOU AFTER SURGERY IN YOUR PRIVATE ROOM DURING VISITING HOURS ONLY!  YOU NEED TO HAVE A COVID 19 TEST ON_8/25______ THIS TEST MUST BE DONE BEFORE SURGERY,                Morgan Reed     Your procedure is scheduled on: 11/03/20   Report to [facility] Main  Entrance   Report to Short stay at 5:15 AM     Call this number if you have problems the morning of surgery [phone]   BRUSH YOUR TEETH MORNING OF SURGERY AND RINSE YOUR MOUTH OUT, NO CHEWING GUM CANDY OR MINTS.   No food after midnight.    You may have clear liquid until 4:30 AM.    At 4:00 AM drink pre surgery drink.   Nothing by mouth after 4:30 AM.    Take these medicines the morning of surgery with A SIP OF WATER: Wellbutrin, Lexapro                                 You may not have any metal on your body including hair pins and              piercings  Do not wear jewelry, make-up, lotions, powders or perfumes, deodorant             Do not wear nail polish on your fingernails.  Do not shave  48 hours prior to surgery.             Do not bring valuables to the hospital. [facility] IS NOT             RESPONSIBLE   FOR VALUABLES.  Contacts, dentures or bridgework may not be worn into surgery.                   [place] - Preparing for Surgery Before surgery, you can play an important role.  Because skin is not sterile, your skin needs to be as free of germs as possible.  You can reduce the number of germs on your skin by washing with CHG (chlorahexidine gluconate) soap before surgery.  CHG is an antiseptic cleaner which kills germs and bonds with the skin to continue killing germs even after washing. Please DO NOT use if you have an allergy to CHG or antibacterial soaps.  If your skin becomes reddened/irritated stop using the CHG and  inform your nurse when you arrive at Short Stay. Do not shave (including legs and underarms) for at least 48 hours prior to the first CHG shower.   Please follow these instructions carefully:  1.  Shower with CHG Soap the night before surgery and the  morning of Surgery.  2.  If you choose to wash your hair, wash your hair first as usual with your  normal  shampoo.  3.  After you shampoo, rinse your hair and body thoroughly to remove the  shampoo.                            4.  Use CHG as you would any other liquid soap.  You can apply chg directly  to the  skin and wash                       Gently with a scrungie or clean washcloth.  5.  Apply the CHG Soap to your body ONLY FROM THE NECK DOWN.   Do not use on face/ open                           Wound or open sores. Avoid contact with eyes, ears mouth and genitals (private parts).                       Wash face,  Genitals (private parts) with your normal soap.             6.  Wash thoroughly, paying special attention to the area where your surgery  will be performed.  7.  Thoroughly rinse your body with warm water from the neck down.  8.  DO NOT shower/wash with your normal soap after using and rinsing off  the CHG Soap.             9.  Pat yourself dry with a clean towel.            10.  Wear clean pajamas.            11.  Place clean sheets on your bed the night of your first shower and do not  sleep with pets. Day of Surgery : Do not apply any lotions/deodorants the morning of surgery.  Please wear clean clothes to the hospital/surgery center.  FAILURE TO FOLLOW THESE INSTRUCTIONS MAY RESULT IN THE CANCELLATION OF YOUR SURGERY PATIENT SIGNATURE_________________________________  NURSE SIGNATURE__________________________________  ________________________________________________________________________   Rogelia Mire  An incentive spirometer is a tool that can help keep your lungs clear and active. This tool measures how well  you are filling your lungs with each breath. Taking long deep breaths may help reverse or decrease the chance of developing breathing (pulmonary) problems (especially infection) following: A long period of time when you are unable to move or be active. BEFORE THE PROCEDURE  If the spirometer includes an indicator to show your best effort, your nurse or respiratory therapist will set it to a desired goal. If possible, sit up straight or lean slightly forward. Try not to slouch. Hold the incentive spirometer in an upright position. INSTRUCTIONS FOR USE  Sit on the edge of your bed if possible, or sit up as far as you can in bed or on a chair. Hold the incentive spirometer in an upright position. Breathe out normally. Place the mouthpiece in your mouth and seal your lips tightly around it. Breathe in slowly and as deeply as possible, raising the piston or the ball toward the top of the column. Hold your breath for 3-5 seconds or for as long as possible. Allow the piston or ball to fall to the bottom of the column. Remove the mouthpiece from your mouth and breathe out normally. Rest for a few seconds and repeat Steps 1 through 7 at least 10 times every 1-2 hours when you are awake. Take your time and take a few normal breaths between deep breaths. The spirometer may include an indicator to show your best effort. Use the indicator as a goal to work toward during each repetition. After each set of 10 deep breaths, practice coughing to be sure your lungs are clear. If you have an incision (  the cut made at the time of surgery), support your incision when coughing by placing a pillow or rolled up towels firmly against it. Once you are able to get out of bed, walk around indoors and cough well. You may stop using the incentive spirometer when instructed by your caregiver.  RISKS AND COMPLICATIONS Take your time so you do not get dizzy or light-headed. If you are in pain, you may need to take or ask for  pain medication before doing incentive spirometry. It is harder to take a deep breath if you are having pain. AFTER USE Rest and breathe slowly and easily. It can be helpful to keep track of a log of your progress. Your caregiver can provide you with a simple table to help with this. If you are using the spirometer at home, follow these instructions: SEEK MEDICAL CARE IF:  You are having difficultly using the spirometer. You have trouble using the spirometer as often as instructed. Your pain medication is not giving enough relief while using the spirometer. You develop fever of 100.5 F (38.1 C) or higher. SEEK IMMEDIATE MEDICAL CARE IF:  You cough up bloody sputum that had not been present before. You develop fever of 102 F (38.9 C) or greater. You develop worsening pain at or near the incision site. MAKE SURE YOU:  Understand these instructions. Will watch your condition. Will get help right away if you are not doing well or get worse. Document Released: 07/05/2006 Document Revised: 05/17/2011 Document Reviewed: 09/05/2006 [facility] Patient Information 2014 [place], [place].   ________________________________________________________________________

## 2020-10-24 ENCOUNTER — Ambulatory Visit (HOSPITAL_COMMUNITY)
Admission: RE | Admit: 2020-10-24 | Discharge: 2020-10-24 | Disposition: A | Discharge status: Home / Self Care | Payer: Medicare Other | Source: Ambulatory Visit | Attending: Orthopedic Surgery | Admitting: Orthopedic Surgery

## 2020-10-24 ENCOUNTER — Encounter (HOSPITAL_COMMUNITY): Payer: Self-pay

## 2020-10-24 ENCOUNTER — Encounter (HOSPITAL_COMMUNITY)
Admission: RE | Admit: 2020-10-24 | Discharge: 2020-10-24 | Disposition: A | Discharge status: Home / Self Care | Payer: Medicare Other | Source: Ambulatory Visit | Attending: Orthopedic Surgery | Admitting: Orthopedic Surgery

## 2020-10-24 ENCOUNTER — Other Ambulatory Visit: Payer: Self-pay

## 2020-10-24 DIAGNOSIS — Z01818 Encounter for other preprocedural examination: Secondary | ICD-10-CM

## 2020-10-24 HISTORY — DX: Unspecified osteoarthritis, unspecified site: M19.90

## 2020-10-24 HISTORY — DX: Depression, unspecified: F32.A

## 2020-10-24 HISTORY — DX: Anxiety disorder, unspecified: F41.9

## 2020-10-24 LAB — SURGICAL PCR SCREEN
MRSA, PCR: NEGATIVE
Staphylococcus aureus: NEGATIVE

## 2020-10-24 LAB — CBC WITH DIFFERENTIAL/PLATELET
Abs Immature Granulocytes: 0.04 10*3/uL (ref 0.00–0.07)
Basophils Absolute: 0.1 10*3/uL (ref 0.0–0.1)
Basophils Relative: 1 %
Eosinophils Absolute: 0.1 10*3/uL (ref 0.0–0.5)
Eosinophils Relative: 1 %
HCT: 43.6 % (ref 36.0–46.0)
Hemoglobin: 13.9 g/dL (ref 12.0–15.0)
Immature Granulocytes: 1 %
Lymphocytes Relative: 19 %
Lymphs Abs: 1.5 10*3/uL (ref 0.7–4.0)
MCH: 30.3 pg (ref 26.0–34.0)
MCHC: 31.9 g/dL (ref 30.0–36.0)
MCV: 95.2 fL (ref 80.0–100.0)
Monocytes Absolute: 0.6 10*3/uL (ref 0.1–1.0)
Monocytes Relative: 8 %
Neutro Abs: 5.6 10*3/uL (ref 1.7–7.7)
Neutrophils Relative %: 70 %
Platelets: 302 10*3/uL (ref 150–400)
RBC: 4.58 MIL/uL (ref 3.87–5.11)
RDW: 13 % (ref 11.5–15.5)
WBC: 8 10*3/uL (ref 4.0–10.5)
nRBC: 0 % (ref 0.0–0.2)

## 2020-10-24 LAB — URINALYSIS, ROUTINE W REFLEX MICROSCOPIC
Bilirubin Urine: NEGATIVE
Glucose, UA: NEGATIVE mg/dL
Hgb urine dipstick: NEGATIVE
Ketones, ur: 5 mg/dL — AB
Leukocytes,Ua: NEGATIVE
Nitrite: NEGATIVE
Protein, ur: NEGATIVE mg/dL
Specific Gravity, Urine: 1.017 (ref 1.005–1.030)
pH: 5 (ref 5.0–8.0)

## 2020-10-24 LAB — PROTIME-INR
INR: 1 (ref 0.8–1.2)
Prothrombin Time: 13.1 seconds (ref 11.4–15.2)

## 2020-10-24 LAB — BASIC METABOLIC PANEL
Anion gap: 6 (ref 5–15)
BUN: 25 mg/dL — ABNORMAL HIGH (ref 8–23)
CO2: 28 mmol/L (ref 22–32)
Calcium: 9.3 mg/dL (ref 8.9–10.3)
Chloride: 107 mmol/L (ref 98–111)
Creatinine, Ser: 0.89 mg/dL (ref 0.44–1.00)
GFR, Estimated: >60 mL/min (ref >60)
Glucose, Bld: 102 mg/dL — ABNORMAL HIGH (ref 70–99)
Potassium: 4.8 mmol/L (ref 3.5–5.1)
Sodium: 141 mmol/L (ref 135–145)

## 2020-10-24 LAB — APTT: aPTT: 45 seconds — ABNORMAL HIGH (ref 24–36)

## 2020-10-24 NOTE — Progress Notes (Addendum)
COVID Vaccine Completed:Yes  Date COVID test 10/30/20   PCP - Horton Marshall PA Cardiologist - none  Chest x-ray - no EKG - no Stress Test - no ECHO - no Cardiac Cath - no Pacemaker/ICD device last checked:NA  Sleep Study - no Pt uses an oral device for snoring CPAP -   Fasting Blood Sugar - NA Checks Blood Sugar _____ times a day  Blood Thinner Instructions:NA Aspirin Instructions: Last Dose:  Anesthesia review: no  Patient denies shortness of breath, fever, cough and chest pain at PAT appointment Pt reports no SOB climbing stairs, doing housework or with ADLs Pt had a bad experience when she had her knee done before in [place].  Patient verbalized understanding of instructions that were given to them at the PAT appointment. Patient was also instructed that they will need to review over the PAT instructions again at home before surgery. Yes

## 2020-10-28 ENCOUNTER — Encounter (HOSPITAL_COMMUNITY): Payer: Self-pay | Admitting: Physician Assistant

## 2020-10-29 DIAGNOSIS — M25561 Pain in right knee: Secondary | ICD-10-CM | POA: Diagnosis not present

## 2020-10-29 DIAGNOSIS — Z01818 Encounter for other preprocedural examination: Secondary | ICD-10-CM | POA: Diagnosis not present

## 2020-10-29 DIAGNOSIS — M1711 Unilateral primary osteoarthritis, right knee: Secondary | ICD-10-CM | POA: Diagnosis not present

## 2020-10-30 ENCOUNTER — Other Ambulatory Visit: Payer: Self-pay | Admitting: Orthopedic Surgery

## 2020-10-31 ENCOUNTER — Encounter: Payer: Self-pay | Admitting: Hematology and Oncology

## 2020-10-31 ENCOUNTER — Other Ambulatory Visit: Payer: Self-pay

## 2020-10-31 ENCOUNTER — Other Ambulatory Visit (HOSPITAL_COMMUNITY): Payer: Self-pay

## 2020-10-31 ENCOUNTER — Telehealth: Payer: Self-pay

## 2020-10-31 ENCOUNTER — Inpatient Hospital Stay: Payer: Medicare Other | Attending: Hematology and Oncology | Admitting: Hematology and Oncology

## 2020-10-31 ENCOUNTER — Inpatient Hospital Stay: Payer: Medicare Other

## 2020-10-31 ENCOUNTER — Telehealth: Payer: Self-pay | Admitting: Hematology and Oncology

## 2020-10-31 DIAGNOSIS — R791 Abnormal coagulation profile: Secondary | ICD-10-CM

## 2020-10-31 DIAGNOSIS — M1990 Unspecified osteoarthritis, unspecified site: Secondary | ICD-10-CM

## 2020-10-31 DIAGNOSIS — G4700 Insomnia, unspecified: Secondary | ICD-10-CM | POA: Insufficient documentation

## 2020-10-31 DIAGNOSIS — E669 Obesity, unspecified: Secondary | ICD-10-CM | POA: Insufficient documentation

## 2020-10-31 DIAGNOSIS — M179 Osteoarthritis of knee, unspecified: Secondary | ICD-10-CM

## 2020-10-31 DIAGNOSIS — E785 Hyperlipidemia, unspecified: Secondary | ICD-10-CM | POA: Insufficient documentation

## 2020-10-31 DIAGNOSIS — M8580 Other specified disorders of bone density and structure, unspecified site: Secondary | ICD-10-CM | POA: Insufficient documentation

## 2020-10-31 DIAGNOSIS — Z96659 Presence of unspecified artificial knee joint: Secondary | ICD-10-CM

## 2020-10-31 LAB — CBC WITH DIFFERENTIAL/PLATELET
Abs Immature Granulocytes: 0.01 10*3/uL (ref 0.00–0.07)
Basophils Absolute: 0.1 10*3/uL (ref 0.0–0.1)
Basophils Relative: 1 %
Eosinophils Absolute: 0.1 10*3/uL (ref 0.0–0.5)
Eosinophils Relative: 1 %
HCT: 38.6 % (ref 36.0–46.0)
Hemoglobin: 12.9 g/dL (ref 12.0–15.0)
Immature Granulocytes: 0 %
Lymphocytes Relative: 24 %
Lymphs Abs: 1.6 10*3/uL (ref 0.7–4.0)
MCH: 30.7 pg (ref 26.0–34.0)
MCHC: 33.4 g/dL (ref 30.0–36.0)
MCV: 91.9 fL (ref 80.0–100.0)
Monocytes Absolute: 0.6 10*3/uL (ref 0.1–1.0)
Monocytes Relative: 8 %
Neutro Abs: 4.2 10*3/uL (ref 1.7–7.7)
Neutrophils Relative %: 66 %
Platelets: 233 10*3/uL (ref 150–400)
RBC: 4.2 MIL/uL (ref 3.87–5.11)
RDW: 12.7 % (ref 11.5–15.5)
WBC: 6.5 10*3/uL (ref 4.0–10.5)
nRBC: 0 % (ref 0.0–0.2)

## 2020-10-31 LAB — COMPREHENSIVE METABOLIC PANEL
ALT: 54 U/L — ABNORMAL HIGH (ref 0–44)
AST: 29 U/L (ref 15–41)
Albumin: 4 g/dL (ref 3.5–5.0)
Alkaline Phosphatase: 84 U/L (ref 38–126)
Anion gap: 7 (ref 5–15)
BUN: 21 mg/dL (ref 8–23)
CO2: 27 mmol/L (ref 22–32)
Calcium: 9.3 mg/dL (ref 8.9–10.3)
Chloride: 106 mmol/L (ref 98–111)
Creatinine, Ser: 0.84 mg/dL (ref 0.44–1.00)
GFR, Estimated: >60 mL/min (ref >60)
Glucose, Bld: 99 mg/dL (ref 70–99)
Potassium: 4.4 mmol/L (ref 3.5–5.1)
Sodium: 140 mmol/L (ref 135–145)
Total Bilirubin: 0.6 mg/dL (ref 0.3–1.2)
Total Protein: 6.7 g/dL (ref 6.5–8.1)

## 2020-10-31 LAB — SARS CORONAVIRUS 2 (TAT 6-24 HRS): SARS Coronavirus 2: NEGATIVE

## 2020-10-31 LAB — APTT: aPTT: 46 seconds — ABNORMAL HIGH (ref 24–36)

## 2020-10-31 NOTE — H&P (Signed)
TOTAL KNEE REVISION ADMISSION H&P  Patient is being admitted for right revision total knee arthroplasty.  Subjective:  Chief Complaint:right knee pain.  HPI: Morgan Reed, 75 y.o. female, has a history of pain and functional disability in the right knee(s) due to failed previous arthroplasty and patient has failed non-surgical conservative treatments for greater than 12 weeks to include NSAID's and/or analgesics, use of assistive devices, weight reduction as appropriate, and activity modification. The indications for the revision of the total knee arthroplasty are bearing surface wear leading to symptomatic synovitis and implant or knee misalignment. Onset of symptoms was abrupt starting  several  years ago with gradually worsening course since that time.  Prior procedures on the right knee(s) include unicompartmental arthroplasty.  Patient currently rates pain in the right knee(s) at 10 out of 10 with activity. There is night pain, worsening of pain with activity and weight bearing, pain that interferes with activities of daily living, pain with passive range of motion, crepitus, and joint swelling.  Patient has evidence of joint space narrowing by imaging studies. This condition presents safety issues increasing the risk of falls.  There is no current active infection.  Patient Active Problem List   Diagnosis Date Noted   Osteoarthritis 10/31/2020   Hyperlipidemia 10/31/2020   Obesity, Class I, BMI 30-34.9 10/31/2020   Osteopenia 10/31/2020   Insomnia 10/31/2020   Abnormal coagulation profile 10/31/2020   Past Medical History:  Diagnosis Date   Anxiety    Arthritis    knees, hands   Depression     Past Surgical History:  Procedure Laterality Date   ABDOMINAL HYSTERECTOMY  1995   complication with bowel tear   BREAST BIOPSY     right breast benign approx 1990   BREAST EXCISIONAL BIOPSY Right    right breast benign 1990   EYE SURGERY Bilateral 2014   TONSILLECTOMY     as a child     No current facility-administered medications for this encounter.   Current Outpatient Medications  Medication Sig Dispense Refill Last Dose   acidophilus (RISAQUAD) CAPS capsule Take 1 capsule by mouth daily.      atorvastatin (LIPITOR) 40 MG tablet Take 40 mg by mouth daily.      buPROPion (WELLBUTRIN XL) 300 MG 24 hr tablet Take 300 mg by mouth every morning.      CALCIUM-VITAMIN D PO Take 1 tablet by mouth daily.      diclofenac (VOLTAREN) 75 MG EC tablet Take 75 mg by mouth daily.      diclofenac Sodium (VOLTAREN) 1 % GEL Apply 2 g topically 4 (four) times daily as needed (pain).      escitalopram (LEXAPRO) 5 MG tablet Take 5 mg by mouth daily.      Multiple Vitamin (MULTIVITAMIN WITH MINERALS) TABS tablet Take 1 tablet by mouth daily.      Polyethyl Glycol-Propyl Glycol (SYSTANE OP) Place 1 drop into both eyes 2 (two) times daily as needed (dry eyes).      traMADol (ULTRAM) 50 MG tablet Take 50 mg by mouth 2 (two) times daily as needed for pain.      traZODone (DESYREL) 50 MG tablet Take 50 mg by mouth at bedtime.      No Known Allergies  Social History   Tobacco Use   Smoking status: Former    Years: 5.00    Types: Cigarettes    Quit date: 1978    Years since quitting: 44.6   Smokeless tobacco: Never  Substance Use Topics   Alcohol use: Never    No family history on file.    Review of Systems  Constitutional: Negative.   HENT: Negative.    Eyes: Negative.   Respiratory: Negative.    Cardiovascular: Negative.   Gastrointestinal: Negative.   Endocrine: Negative.   Genitourinary: Negative.   Musculoskeletal:  Positive for arthralgias.  Skin: Negative.   Allergic/Immunologic: Negative.   Neurological: Negative.   Hematological:  Bruises/bleeds easily.  Psychiatric/Behavioral: Negative.      Objective:  Physical Exam Constitutional:      Appearance: Normal appearance.  HENT:     Head: Normocephalic and atraumatic.     Nose: Nose normal.     Mouth/Throat:      Mouth: Mucous membranes are moist.     Pharynx: Oropharynx is clear.  Eyes:     Pupils: Pupils are equal, round, and reactive to light.  Cardiovascular:     Pulses: Normal pulses.  Pulmonary:     Effort: Pulmonary effort is normal.  Musculoskeletal:        General: Swelling and tenderness present.     Cervical back: Normal range of motion and neck supple.     Comments: the patient's right knee has a mild effusion.  No erythema or warmth.  Mild to moderate pain over the medial joint line and moderate pain over the lateral joint line.  No instability with valgus or varus stress.  She has a range from 5 to 115.  On the left, the patient also has tenderness over the medial joint line.  Mild crepitus with range of motion.  No instability with valgus varus stress.  Range from 3-5 to 115.  Calves are soft and nontender bilaterally.  Skin:    General: Skin is warm and dry.  Neurological:     General: No focal deficit present.     Mental Status: She is alert and oriented to person, place, and time. Mental status is at baseline.  Psychiatric:        Mood and Affect: Mood normal.        Behavior: Behavior normal.        Thought Content: Thought content normal.        Judgment: Judgment normal.    Vital signs in last 24 hours:    Labs:  Estimated body mass index is 30.3 kg/m as calculated from the following:   Height as of 10/24/20: 4\' 11"  (1.499 m).   Weight as of 10/24/20: 68 kg.  Imaging Review Plain radiographs demonstrate bilateral AP weightbearing, bilateral Rosenberg, lateral sunrise views of the right and left knee are taken and reviewed in office today.  Patient's right knee does have a unicompartmental knee arthroplasty that appears to be well fixed.  There was a large portion of the tibia removed.  Patient has bone-on-bone arthritis lateral compartment with the Star view.  Periarticular osteophyte formation.  Patient's left knee has bone-on-bone arthritis medial  compartment.   Assessment/Plan:  End stage arthritis, right knee(s) with failed previous arthroplasty.   The patient history, physical examination, clinical judgment of the provider and imaging studies are consistent with end stage degenerative joint disease of the right knee(s), previous total knee arthroplasty. Revision total knee arthroplasty is deemed medically necessary. The treatment options including medical management, injection therapy, arthroscopy and revision arthroplasty were discussed at length. The risks and benefits of revision total knee arthroplasty were presented and reviewed. The risks due to aseptic loosening, infection, stiffness, patella tracking problems, thromboembolic  complications and other imponderables were discussed. The patient acknowledged the explanation, agreed to proceed with the plan and consent was signed. Patient is being admitted for inpatient treatment for surgery, pain control, PT, OT, prophylactic antibiotics, VTE prophylaxis, progressive ambulation and ADL's and discharge planning.The patient is planning to be discharged home with home health services

## 2020-10-31 NOTE — Progress Notes (Signed)
[facility] CONSULT NOTE  Patient Care Team: Wilfrid Lund, PA as PCP - General [facility])  ASSESSMENT & PLAN Abnormal coagulation profile I have reviewed her recent blood test The cause is unknown We will get it repeated today with mixing study Lupus anticoagulant cannot be excluded She had numerous surgeries in the past without excessive bleeding I suspect she does not have a bleeding disorder that causes abnormal elevated APTT Unfortunately, repeat APTT level is still not normal At this point in time, I cannot clear her for surgery next week We will notify her surgeon about this I will call her next week once mixing study results are available and she is aware she might need further testing  Osteoarthritis She is scheduled for knee revision surgery on Monday I am not be able to provide surgical clearance from the hematology perspective In the meantime, I recommend the patient to discontinue taking diclofenac, other NSAID or aspirin containing products  Orders Placed This Encounter  Procedures   Comprehensive metabolic panel    Standing Status:   Future    Number of Occurrences:   1    Standing Expiration Date:   10/31/2021   CBC with Differential/Platelet    Standing Status:   Future    Number of Occurrences:   1    Standing Expiration Date:   10/31/2021   APTT    Standing Status:   Future    Number of Occurrences:   1    Standing Expiration Date:   10/31/2021   Ptt factor inhibitor (mixing study)    Standing Status:   Future    Number of Occurrences:   1    Standing Expiration Date:   10/31/2021   All questions were answered. The patient knows to call the clinic with any problems, questions or concerns. No barriers to learning was detected. The total time spent in the appointment was 55 minutes encounter with patients including review of chart and various tests results, discussions about plan of care and coordination of care plan  Artis Delay,  MD 10/31/2020 3:42 PM  CHIEF COMPLAINTS/PURPOSE OF CONSULTATION:  Abnormal coagulation profile, urgent consult in anticipation for right knee revision surgery on Monday, 11/03/2020  HISTORY OF PRESENTING ILLNESS:  Morgan Reed 75 y.o. female is here because of elevated APTT  She was found to have abnormal CBC from preoperative blood work a week ago She is scheduled for right knee revision surgery on Monday, November 03, 2020 The patient had multiple different surgeries in the past without excessive bleeding complications She denies recent bruising/bleeding, such as spontaneous epistaxis, hematuria, melena or hematochezia She denies recent excessive menorrhagia The patient denies history of liver disease, exposure to heparin, history of cardiac murmur/prior cardiovascular surgery or recent new medications She denies prior blood or platelet transfusions   MEDICAL HISTORY:  Past Medical History:  Diagnosis Date   Anxiety    Arthritis    knees, hands   Depression     SURGICAL HISTORY: Past Surgical History:  Procedure Laterality Date   ABDOMINAL HYSTERECTOMY  1995   complication with bowel tear   BREAST BIOPSY     right breast benign approx 1990   BREAST EXCISIONAL BIOPSY Right    right breast benign 1990   EYE SURGERY Bilateral 2014   TONSILLECTOMY     as a child    SOCIAL HISTORY: Social History   Socioeconomic History   Marital status: Widowed    Spouse name: Not  on file   Number of children: Not on file   Years of education: Not on file   Highest education level: Not on file  Occupational History   Not on file  Tobacco Use   Smoking status: Former    Years: 5.00    Types: Cigarettes    Quit date: 3    Years since quitting: 44.6   Smokeless tobacco: Never  Vaping Use   Vaping Use: Never used  Substance and Sexual Activity   Alcohol use: Never   Drug use: Never   Sexual activity: Not on file  Other Topics Concern   Not on file  Social History Narrative    Not on file   Social Determinants of Health   Financial Resource Strain: Not on file  Food Insecurity: Not on file  Transportation Needs: Not on file  Physical Activity: Not on file  Stress: Not on file  Social Connections: Not on file  Intimate Partner Violence: Not on file    FAMILY HISTORY: No family history on file.  ALLERGIES:  has No Known Allergies.  MEDICATIONS:  Current Outpatient Medications  Medication Sig Dispense Refill   acidophilus (RISAQUAD) CAPS capsule Take 1 capsule by mouth daily.     atorvastatin (LIPITOR) 40 MG tablet Take 40 mg by mouth daily.     buPROPion (WELLBUTRIN XL) 300 MG 24 hr tablet Take 300 mg by mouth every morning.     CALCIUM-VITAMIN D PO Take 1 tablet by mouth daily.     diclofenac (VOLTAREN) 75 MG EC tablet Take 75 mg by mouth daily.     diclofenac Sodium (VOLTAREN) 1 % GEL Apply 2 g topically 4 (four) times daily as needed (pain).     escitalopram (LEXAPRO) 5 MG tablet Take 5 mg by mouth daily.     Multiple Vitamin (MULTIVITAMIN WITH MINERALS) TABS tablet Take 1 tablet by mouth daily.     Polyethyl Glycol-Propyl Glycol (SYSTANE OP) Place 1 drop into both eyes 2 (two) times daily as needed (dry eyes).     traMADol (ULTRAM) 50 MG tablet Take 50 mg by mouth 2 (two) times daily as needed for pain.     traZODone (DESYREL) 50 MG tablet Take 50 mg by mouth at bedtime.     No current facility-administered medications for this visit.    REVIEW OF SYSTEMS:   Constitutional: Denies fevers, chills or abnormal night sweats Eyes: Denies blurriness of vision, double vision or watery eyes Ears, nose, mouth, throat, and face: Denies mucositis or sore throat Respiratory: Denies cough, dyspnea or wheezes Cardiovascular: Denies palpitation, chest discomfort or lower extremity swelling Gastrointestinal:  Denies nausea, heartburn or change in bowel habits Skin: Denies abnormal skin rashes Lymphatics: Denies new lymphadenopathy or easy  bruising Neurological:Denies numbness, tingling or new weaknesses Behavioral/Psych: Mood is stable, no new changes  All other systems were reviewed with the patient and are negative.  PHYSICAL EXAMINATION: ECOG PERFORMANCE STATUS: 1 - Symptomatic but completely ambulatory  Vitals:   10/31/20 1341  BP: 136/76  Pulse: 78  Resp: 18  Temp: 97.7 F (36.5 C)  SpO2: 97%   Filed Weights   10/31/20 1341  Weight: 156 lb 6.4 oz (70.9 kg)    GENERAL:alert, no distress and comfortable SKIN: skin color, texture, turgor are normal, no rashes or significant lesions EYES: normal, conjunctiva are pink and non-injected, sclera clear OROPHARYNX:no exudate, no erythema and lips, buccal mucosa, and tongue normal  NECK: supple, thyroid normal size, non-tender, without nodularity LYMPH:  no palpable lymphadenopathy in the cervical, axillary or inguinal LUNGS: clear to auscultation and percussion with normal breathing effort HEART: regular rate & rhythm and no murmurs and no lower extremity edema ABDOMEN:abdomen soft, non-tender and normal bowel sounds Musculoskeletal:no cyanosis of digits and no clubbing  PSYCH: alert & oriented x 3 with fluent speech NEURO: no focal motor/sensory deficits  LABORATORY DATA:  I have reviewed the data as listed  RADIOGRAPHIC STUDIES: I have personally reviewed the radiological images as listed and agreed with the findings in the report. DG Chest 2 View  Result Date: 10/26/2020 CLINICAL DATA:  Preoperative study prior to knee replacement. EXAM: CHEST - 2 VIEW COMPARISON:  None. FINDINGS: The heart size and mediastinal contours are within normal limits. Both lungs are clear. The visualized skeletal structures are unremarkable. IMPRESSION: No active cardiopulmonary disease. Electronically Signed   By: Gerome Sam III M.D.   On: 10/26/2020 12:15

## 2020-10-31 NOTE — Telephone Encounter (Signed)
Called and left a message for surgery scheduler at [facility] orth[facility]. Dr. Bertis Ruddy is unable to clear Buf[place] for surgery APTT is still elevated and she will have to do further work up. Dr. Bertis Ruddy is notifying Nyara.  Ask scheduler to call the office back for questions.

## 2020-10-31 NOTE — Assessment & Plan Note (Addendum)
I have reviewed her recent blood test The cause is unknown We will get it repeated today with mixing study Lupus anticoagulant cannot be excluded She had numerous surgeries in the past without excessive bleeding I suspect she does not have a bleeding disorder that causes abnormal elevated APTT Unfortunately, repeat APTT level is still not normal At this point in time, I cannot clear her for surgery next week We will notify her surgeon about this I will call her next week once mixing study results are available and she is aware she might need further testing

## 2020-10-31 NOTE — Assessment & Plan Note (Addendum)
She is scheduled for knee revision surgery on Monday I am not be able to provide surgical clearance from the hematology perspective In the meantime, I recommend the patient to discontinue taking diclofenac, other NSAID or aspirin containing products

## 2020-10-31 NOTE — Telephone Encounter (Signed)
Received an urgent referral from [place] [facility] for [facility] for elevated APTT. Morgan Reed has been scheduled to see Dr. Bertis Ruddy today at 2pm. [name] from [place] will notify the pt. Aware the pt should arrive 30 minutes early.

## 2020-11-03 ENCOUNTER — Inpatient Hospital Stay (HOSPITAL_COMMUNITY): Admission: RE | Admit: 2020-11-03 | Payer: Medicare Other | Source: Home / Self Care | Admitting: Orthopedic Surgery

## 2020-11-03 ENCOUNTER — Telehealth: Payer: Self-pay

## 2020-11-03 ENCOUNTER — Encounter (HOSPITAL_COMMUNITY): Admission: RE | Payer: Self-pay | Source: Home / Self Care

## 2020-11-03 LAB — PTT FACTOR INHIBITOR (MIXING STUDY)
aPTT 1:1 NP Incub. Mix Ctl: 32 s — ABNORMAL HIGH (ref 22.9–30.2)
aPTT 1:1 NP Mix, 60 Min,Incub.: 32.5 s — ABNORMAL HIGH (ref 22.9–30.2)
aPTT 1:1 Normal Plasma: 29.4 s (ref 22.9–30.2)
aPTT: 33.9 s — ABNORMAL HIGH (ref 22.9–30.2)

## 2020-11-03 LAB — TYPE AND SCREEN
ABO/RH(D): B POS
Antibody Screen: NEGATIVE

## 2020-11-03 SURGERY — TOTAL KNEE REVISION
Anesthesia: Spinal | Site: Knee | Laterality: Right

## 2020-11-03 NOTE — Telephone Encounter (Signed)
Called per Dr. Bertis Ruddy, told her Dr. Bertis Ruddy will call her Friday with lab results. Lab results are not back. Depending on results of the mixing studies she might need additional lab. She verbalized understanding.

## 2020-11-07 ENCOUNTER — Telehealth: Payer: Self-pay | Admitting: Hematology and Oncology

## 2020-11-07 ENCOUNTER — Other Ambulatory Visit: Payer: Self-pay | Admitting: Hematology and Oncology

## 2020-11-07 DIAGNOSIS — R760 Raised antibody titer: Secondary | ICD-10-CM | POA: Insufficient documentation

## 2020-11-07 DIAGNOSIS — D6862 Lupus anticoagulant syndrome: Secondary | ICD-10-CM

## 2020-11-07 DIAGNOSIS — R791 Abnormal coagulation profile: Secondary | ICD-10-CM

## 2020-11-07 NOTE — Telephone Encounter (Signed)
I reviewed final test results of her mixing study with the patient The test results are most consistent with presence of lupus anticoagulant that is interfering with the mixing study causing prolonged APTT Typically, patients are not expected to have clotting problems I recommend we proceed with lupus anticoagulant panel to confirm She is in agreement I will schedule a lab appointment on September 6 and we will call her with test results She expressed verbal understanding

## 2020-11-11 ENCOUNTER — Inpatient Hospital Stay: Payer: Medicare Other | Attending: Hematology and Oncology

## 2020-11-11 ENCOUNTER — Other Ambulatory Visit: Payer: Self-pay

## 2020-11-11 DIAGNOSIS — M1990 Unspecified osteoarthritis, unspecified site: Secondary | ICD-10-CM | POA: Diagnosis not present

## 2020-11-11 DIAGNOSIS — D6862 Lupus anticoagulant syndrome: Secondary | ICD-10-CM | POA: Diagnosis not present

## 2020-11-11 DIAGNOSIS — R791 Abnormal coagulation profile: Secondary | ICD-10-CM

## 2020-11-12 ENCOUNTER — Telehealth: Payer: Self-pay

## 2020-11-12 LAB — LUPUS ANTICOAGULANT PANEL
DRVVT: 55.5 s — ABNORMAL HIGH (ref 0.0–47.0)
PTT Lupus Anticoagulant: 51 s (ref 0.0–51.9)

## 2020-11-12 LAB — DRVVT MIX: dRVVT Mix: 52.2 s — ABNORMAL HIGH (ref 0.0–40.4)

## 2020-11-12 LAB — DRVVT CONFIRM: dRVVT Confirm: 1.6 ratio — ABNORMAL HIGH (ref 0.8–1.2)

## 2020-11-12 NOTE — Telephone Encounter (Signed)
Returned her call and schedule appt with Dr. Bertis Ruddy for tomorrow at 1000.

## 2020-11-13 ENCOUNTER — Encounter: Payer: Self-pay | Admitting: Hematology and Oncology

## 2020-11-13 ENCOUNTER — Inpatient Hospital Stay (HOSPITAL_BASED_OUTPATIENT_CLINIC_OR_DEPARTMENT_OTHER): Payer: Medicare Other | Admitting: Hematology and Oncology

## 2020-11-13 ENCOUNTER — Telehealth: Payer: Self-pay

## 2020-11-13 ENCOUNTER — Other Ambulatory Visit: Payer: Self-pay

## 2020-11-13 DIAGNOSIS — D6862 Lupus anticoagulant syndrome: Secondary | ICD-10-CM | POA: Diagnosis not present

## 2020-11-13 DIAGNOSIS — M1990 Unspecified osteoarthritis, unspecified site: Secondary | ICD-10-CM | POA: Diagnosis not present

## 2020-11-13 DIAGNOSIS — R791 Abnormal coagulation profile: Secondary | ICD-10-CM

## 2020-11-13 DIAGNOSIS — R760 Raised antibody titer: Secondary | ICD-10-CM

## 2020-11-13 DIAGNOSIS — M179 Osteoarthritis of knee, unspecified: Secondary | ICD-10-CM

## 2020-11-13 DIAGNOSIS — M1710 Unilateral primary osteoarthritis, unspecified knee: Secondary | ICD-10-CM

## 2020-11-13 NOTE — Assessment & Plan Note (Signed)
She has significant immobility due to joint pain She would be at risk of thrombosis with immobility I would defer to her orthopedic surgeon for choice of postop operative prophylactic DVT prophylaxis

## 2020-11-13 NOTE — Progress Notes (Signed)
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 but in the absence of thrombotic disorder, that does not make her lupus anticoagulant disorder She is concerned she might have undiagnosed systemic lupus; she had history of butterfly rash and diffuse joint pain I will refer her to see rheumatologist for this  Osteoarthritis She has significant immobility due to joint pain She would be at risk of thrombosis with immobility I would defer to her orthopedic surgeon for choice of postop operative prophylactic DVT prophylaxis  Orders Placed This Encounter  Procedures   Ambulatory referral to Rheumatology    Referral Priority:   Routine    Referral Type:   Consultation    Referral Reason:   Specialty Services Required    Referred to Provider:   Pollyann Savoy, MD    Requested Specialty:   Rheumatology    Number of Visits Requested:   1    The total time spent in the appointment was 30 minutes encounter with patients including review of chart and various tests results, discussions about plan of care and coordination of care plan   All questions were answered. The patient knows to call the clinic with any problems, questions or concerns. No barriers to learning was detected.    Artis Delay, MD 9/8/202210:57 AM  INTERVAL HISTORY: Morgan Reed 75 y.o. female returns for follow-up on  review test results Her daughter, Tammy's presents The patient disclosed family history of leukemia in her mother and liver cancer She had history of intermittent butterfly rash on her face and diffuse joint pain She had knee surgery in 2013 with hematological work-up but was never found to have DVT even though she thought that her physical therapist has mentioned she might have DVT She is miserable with severe joint pain right now and would like to proceed with surgery as soon as possible  SUMMARY OF HEMATOLOGIC HISTORY: Morgan Reed 75 y.o. female is here because of elevated APTT  She was found to have abnormal CBC from preoperative blood work a week ago She is scheduled for right knee revision surgery on Monday, November 03, 2020 The patient had multiple different surgeries in the past without excessive bleeding complications She denies recent bruising/bleeding, such as spontaneous epistaxis, hematuria, melena or hematochezia She denies recent excessive menorrhagia The patient denies history of liver disease, exposure to heparin, history of cardiac murmur/prior cardiovascular surgery or recent new medications She denies prior blood or platelet transfusions Further work-up in August and September review prolonged APTT, not corrected by mixing study and confirm presence of lupus anticoagulant  I have reviewed the past medical history, past surgical history, social history and family history with the patient and they are unchanged from previous note.  ALLERGIES:  has No Known Allergies.  MEDICATIONS:  Current Outpatient Medications  Medication Sig Dispense Refill   acidophilus (RISAQUAD) CAPS capsule Take 1 capsule by mouth daily.     atorvastatin (LIPITOR) 40  MG tablet Take 40 mg by mouth daily.     buPROPion (WELLBUTRIN XL) 300 MG 24 hr tablet Take 300 mg by mouth every morning.     CALCIUM-VITAMIN D PO Take 1 tablet by mouth daily.     diclofenac (VOLTAREN) 75 MG EC tablet Take 75 mg  by mouth daily.     diclofenac Sodium (VOLTAREN) 1 % GEL Apply 2 g topically 4 (four) times daily as needed (pain).     escitalopram (LEXAPRO) 5 MG tablet Take 5 mg by mouth daily.     Multiple Vitamin (MULTIVITAMIN WITH MINERALS) TABS tablet Take 1 tablet by mouth daily.     Polyethyl Glycol-Propyl Glycol (SYSTANE OP) Place 1 drop into both eyes 2 (two) times daily as needed (dry eyes).     traMADol (ULTRAM) 50 MG tablet Take 50 mg by mouth 2 (two) times daily as needed for pain.     traZODone (DESYREL) 50 MG tablet Take 50 mg by mouth at bedtime.     No current facility-administered medications for this visit.     REVIEW OF SYSTEMS:   Constitutional: Denies fevers, chills or night sweats Eyes: Denies blurriness of vision Ears, nose, mouth, throat, and face: Denies mucositis or sore throat Respiratory: Denies cough, dyspnea or wheezes Cardiovascular: Denies palpitation, chest discomfort or lower extremity swelling Gastrointestinal:  Denies nausea, heartburn or change in bowel habits Skin: Denies abnormal skin rashes Lymphatics: Denies new lymphadenopathy or easy bruising Neurological:Denies numbness, tingling or new weaknesses Behavioral/Psych: Mood is stable, no new changes  All other systems were reviewed with the patient and are negative.  PHYSICAL EXAMINATION: ECOG PERFORMANCE STATUS: 2 - Symptomatic, <50% confined to bed  Vitals:   11/13/20 1004  BP: 128/79  Pulse: 72  Resp: 18  Temp: 98 F (36.7 C)  SpO2: 99%   Filed Weights   11/13/20 1004  Weight: 156 lb 9.6 oz (71 kg)    GENERAL:alert, no distress and comfortable NEURO: alert & oriented x 3 with fluent speech, no focal motor/sensory deficits  LABORATORY DATA:  I have reviewed the data as listed     Component Value Date/Time   NA 140 10/31/2020 1348   K 4.4 10/31/2020 1348   CL 106 10/31/2020 1348   CO2 27 10/31/2020 1348   GLUCOSE 99 10/31/2020 1348   BUN 21 10/31/2020 1348   CREATININE 0.84 10/31/2020  1348   CALCIUM 9.3 10/31/2020 1348   PROT 6.7 10/31/2020 1348   ALBUMIN 4.0 10/31/2020 1348   AST 29 10/31/2020 1348   ALT 54 (H) 10/31/2020 1348   ALKPHOS 84 10/31/2020 1348   BILITOT 0.6 10/31/2020 1348   GFRNONAA >60 10/31/2020 1348    No results found for: SPEP, UPEP  Lab Results  Component Value Date   WBC 6.5 10/31/2020   NEUTROABS 4.2 10/31/2020   HGB 12.9 10/31/2020   HCT 38.6 10/31/2020   MCV 91.9 10/31/2020   PLT 233 10/31/2020      Chemistry      Component Value Date/Time   NA 140 10/31/2020 1348   K 4.4 10/31/2020 1348   CL 106 10/31/2020 1348   CO2 27 10/31/2020 1348   BUN 21 10/31/2020 1348   CREATININE 0.84 10/31/2020 1348      Component Value Date/Time   CALCIUM 9.3 10/31/2020 1348   ALKPHOS 84 10/31/2020 1348   AST 29 10/31/2020 1348   ALT 54 (H) 10/31/2020 1348   BILITOT 0.6 10/31/2020 1348

## 2020-11-13 NOTE — Telephone Encounter (Signed)
Faxed office note and letter for surgical clearance to Dr. Cecille Po office at [phone]. Received confirmation.   Called Dr. Cato Mulligan office they call call patient to set up appt.

## 2020-11-13 NOTE — Assessment & Plan Note (Signed)
She has presence of lupus anticoagulant but in the absence of thrombotic disorder, that does not make her lupus anticoagulant disorder She is concerned she might have undiagnosed systemic lupus; she had history of butterfly rash and diffuse joint pain I will refer her to see rheumatologist for this

## 2020-11-13 NOTE — Assessment & Plan Note (Signed)
I reviewed recent blood test results with the patient and her daughter Repeat testing confirmed presence of lupus anticoagulant She does not have clinical lupus anticoagulant disorder due to absence of clinical thrombosis There is no contraindication for her to proceed with surgery as she is not at risk of excessive bleeding I will reach out to her orthopedic surgeon for hematological clearance She does not need future follow-up to follow-up on her prolonged APTT

## 2020-11-14 ENCOUNTER — Other Ambulatory Visit: Payer: Self-pay | Admitting: Orthopedic Surgery

## 2020-11-17 ENCOUNTER — Other Ambulatory Visit (HOSPITAL_COMMUNITY): Payer: Self-pay

## 2020-11-19 ENCOUNTER — Encounter (HOSPITAL_COMMUNITY): Admission: RE | Admit: 2020-11-19 | Payer: Medicare Other | Source: Ambulatory Visit

## 2020-11-19 DIAGNOSIS — F321 Major depressive disorder, single episode, moderate: Secondary | ICD-10-CM | POA: Diagnosis not present

## 2020-11-19 DIAGNOSIS — M1990 Unspecified osteoarthritis, unspecified site: Secondary | ICD-10-CM | POA: Diagnosis not present

## 2020-11-19 DIAGNOSIS — G4700 Insomnia, unspecified: Secondary | ICD-10-CM | POA: Diagnosis not present

## 2020-11-19 DIAGNOSIS — M8580 Other specified disorders of bone density and structure, unspecified site: Secondary | ICD-10-CM | POA: Diagnosis not present

## 2020-11-19 DIAGNOSIS — E7800 Pure hypercholesterolemia, unspecified: Secondary | ICD-10-CM | POA: Diagnosis not present

## 2020-11-19 DIAGNOSIS — E785 Hyperlipidemia, unspecified: Secondary | ICD-10-CM | POA: Diagnosis not present

## 2020-11-19 NOTE — Progress Notes (Signed)
DUE TO COVID-19 ONLY ONE VISITOR IS ALLOWED TO COME WITH YOU AND STAY IN THE WAITING ROOM ONLY DURING PRE OP AND PROCEDURE DAY OF SURGERY.  2 VISITOR  MAY VISIT WITH YOU AFTER SURGERY IN YOUR PRIVATE ROOM DURING VISITING HOURS ONLY!  YOU NEED TO HAVE A COVID 19 TEST ON__  11/27/2020 ____@_  @_from  8am-3pm _____, THIS TEST MUST BE DONE BEFORE SURGERY,  Covid test is done at [address] [place], [place] [address].  This is a drive thru.  No appt required. Please see map.                 Your procedure is scheduled on:  12/01/2020   Report to [facility] Main  Entrance   Report to admitting at    0830AM     Call this number if you have problems the morning of surgery [phone]    REMEMBER: NO  SOLID FOOD CANDY OR GUM AFTER MIDNIGHT. CLEAR LIQUIDS UNTIL    0810am        . NOTHING BY MOUTH EXCEPT CLEAR LIQUIDS UNTIL   0810am  . PLEASE FINISH ENSURE DRINK PER SURGEON ORDER  WHICH NEEDS TO BE COMPLETED AT    0810am   .      CLEAR LIQUID DIET   Foods Allowed                                                                    Coffee and tea, regular and decaf                            Fruit ices (not with fruit pulp)                                      Iced Popsicles                                    Carbonated beverages, regular and diet                                    Cranberry, grape and apple juices Sports drinks like Gatorade Lightly seasoned clear broth or consume(fat free) Sugar, honey syrup ___________________________________________________________________      BRUSH YOUR TEETH MORNING OF SURGERY AND RINSE YOUR MOUTH OUT, NO CHEWING GUM CANDY OR MINTS.     Take these medicines the morning of surgery with A SIP OF WATER:  wellbutrin, lexapro   DO NOT TAKE ANY DIABETIC MEDICATIONS DAY OF YOUR SURGERY                               You may not have any metal on your body including hair pins and              piercings  Do not wear jewelry, make-up, lotions,  powders or perfumes, deodorant  Do not wear nail polish on your fingernails.  Do not shave  48 hours prior to surgery.              Men may shave face and neck.   Do not bring valuables to the hospital. [place].  Contacts, dentures or bridgework may not be worn into surgery.  Leave suitcase in the car. After surgery it may be brought to your room.     Patients discharged the day of surgery will not be allowed to drive home. IF YOU ARE HAVING SURGERY AND GOING HOME THE SAME DAY, YOU MUST HAVE AN ADULT TO DRIVE YOU HOME AND BE WITH YOU FOR 24 HOURS. YOU MAY GO HOME BY TAXI OR UBER OR ORTHERWISE, BUT AN ADULT MUST ACCOMPANY YOU HOME AND STAY WITH YOU FOR 24 HOURS.  Name and phone number of your driver:  Special Instructions: N/A              Please read over the following fact sheets you were given: _____________________________________________________________________  [facility] - Preparing for Surgery Before surgery, you can play an important role.  Because skin is not sterile, your skin needs to be as free of germs as possible.  You can reduce the number of germs on your skin by washing with CHG (chlorahexidine gluconate) soap before surgery.  CHG is an antiseptic cleaner which kills germs and bonds with the skin to continue killing germs even after washing. Please DO NOT use if you have an allergy to CHG or antibacterial soaps.  If your skin becomes reddened/irritated stop using the CHG and inform your nurse when you arrive at Short Stay. Do not shave (including legs and underarms) for at least 48 hours prior to the first CHG shower.  You may shave your face/neck. Please follow these instructions carefully:  1.  Shower with CHG Soap the night before surgery and the  morning of Surgery.  2.  If you choose to wash your hair, wash your hair first as usual with your  normal  shampoo.  3.  After you shampoo, rinse your hair and body  thoroughly to remove the  shampoo.                           4.  Use CHG as you would any other liquid soap.  You can apply chg directly  to the skin and wash                       Gently with a scrungie or clean washcloth.  5.  Apply the CHG Soap to your body ONLY FROM THE NECK DOWN.   Do not use on face/ open                           Wound or open sores. Avoid contact with eyes, ears mouth and genitals (private parts).                       Wash face,  Genitals (private parts) with your normal soap.             6.  Wash thoroughly, paying special attention to the area where your surgery  will be performed.  7.  Thoroughly rinse your body with  warm water from the neck down.  8.  DO NOT shower/wash with your normal soap after using and rinsing off  the CHG Soap.                9.  Pat yourself dry with a clean towel.            10.  Wear clean pajamas.            11.  Place clean sheets on your bed the night of your first shower and do not  sleep with pets. Day of Surgery : Do not apply any lotions/deodorants the morning of surgery.  Please wear clean clothes to the hospital/surgery center.  FAILURE TO FOLLOW THESE INSTRUCTIONS MAY RESULT IN THE CANCELLATION OF YOUR SURGERY PATIENT SIGNATURE_________________________________  NURSE SIGNATURE__________________________________  ________________________________________________________________________

## 2020-11-20 DIAGNOSIS — M1711 Unilateral primary osteoarthritis, right knee: Secondary | ICD-10-CM | POA: Diagnosis not present

## 2020-11-21 ENCOUNTER — Encounter (HOSPITAL_COMMUNITY)
Admission: RE | Admit: 2020-11-21 | Discharge: 2020-11-21 | Disposition: A | Discharge status: Home / Self Care | Payer: Medicare Other | Source: Ambulatory Visit | Attending: Orthopedic Surgery | Admitting: Orthopedic Surgery

## 2020-11-21 ENCOUNTER — Other Ambulatory Visit: Payer: Self-pay

## 2020-11-21 ENCOUNTER — Encounter (HOSPITAL_COMMUNITY): Payer: Self-pay

## 2020-11-21 DIAGNOSIS — Z79899 Other long term (current) drug therapy: Secondary | ICD-10-CM | POA: Insufficient documentation

## 2020-11-21 DIAGNOSIS — Z87891 Personal history of nicotine dependence: Secondary | ICD-10-CM | POA: Diagnosis not present

## 2020-11-21 DIAGNOSIS — M1712 Unilateral primary osteoarthritis, left knee: Secondary | ICD-10-CM | POA: Diagnosis not present

## 2020-11-21 DIAGNOSIS — Z01812 Encounter for preprocedural laboratory examination: Secondary | ICD-10-CM | POA: Insufficient documentation

## 2020-11-21 DIAGNOSIS — D6862 Lupus anticoagulant syndrome: Secondary | ICD-10-CM | POA: Diagnosis not present

## 2020-11-21 LAB — CBC WITH DIFFERENTIAL/PLATELET
Abs Immature Granulocytes: 0.03 10*3/uL (ref 0.00–0.07)
Basophils Absolute: 0.1 10*3/uL (ref 0.0–0.1)
Basophils Relative: 1 %
Eosinophils Absolute: 0.1 10*3/uL (ref 0.0–0.5)
Eosinophils Relative: 1 %
HCT: 41 % (ref 36.0–46.0)
Hemoglobin: 13.2 g/dL (ref 12.0–15.0)
Immature Granulocytes: 0 %
Lymphocytes Relative: 26 %
Lymphs Abs: 1.9 10*3/uL (ref 0.7–4.0)
MCH: 30.3 pg (ref 26.0–34.0)
MCHC: 32.2 g/dL (ref 30.0–36.0)
MCV: 94 fL (ref 80.0–100.0)
Monocytes Absolute: 0.7 10*3/uL (ref 0.1–1.0)
Monocytes Relative: 9 %
Neutro Abs: 4.5 10*3/uL (ref 1.7–7.7)
Neutrophils Relative %: 63 %
Platelets: 287 10*3/uL (ref 150–400)
RBC: 4.36 MIL/uL (ref 3.87–5.11)
RDW: 12.5 % (ref 11.5–15.5)
WBC: 7.2 10*3/uL (ref 4.0–10.5)
nRBC: 0 % (ref 0.0–0.2)

## 2020-11-21 LAB — BASIC METABOLIC PANEL
Anion gap: 9 (ref 5–15)
BUN: 22 mg/dL (ref 8–23)
CO2: 26 mmol/L (ref 22–32)
Calcium: 9.4 mg/dL (ref 8.9–10.3)
Chloride: 104 mmol/L (ref 98–111)
Creatinine, Ser: 0.67 mg/dL (ref 0.44–1.00)
GFR, Estimated: >60 mL/min (ref >60)
Glucose, Bld: 96 mg/dL (ref 70–99)
Potassium: 4 mmol/L (ref 3.5–5.1)
Sodium: 139 mmol/L (ref 135–145)

## 2020-11-21 LAB — URINALYSIS, ROUTINE W REFLEX MICROSCOPIC
Bilirubin Urine: NEGATIVE
Glucose, UA: NEGATIVE mg/dL
Hgb urine dipstick: NEGATIVE
Ketones, ur: NEGATIVE mg/dL
Leukocytes,Ua: NEGATIVE
Nitrite: NEGATIVE
Protein, ur: NEGATIVE mg/dL
Specific Gravity, Urine: 1.025 (ref 1.005–1.030)
pH: 6 (ref 5.0–8.0)

## 2020-11-21 LAB — SURGICAL PCR SCREEN
MRSA, PCR: NEGATIVE
Staphylococcus aureus: NEGATIVE

## 2020-11-21 LAB — APTT: aPTT: 47 seconds — ABNORMAL HIGH (ref 24–36)

## 2020-11-21 LAB — PROTIME-INR
INR: 1 (ref 0.8–1.2)
Prothrombin Time: 12.9 seconds (ref 11.4–15.2)

## 2020-11-21 NOTE — Progress Notes (Signed)
Anesthesia Chart Review   Case: [id number] Date/Time: 12/01/20 1055   Procedure: RIGHT TOTAL KNEE REVISION (Right: Knee)   Anesthesia type: Spinal   Pre-op diagnosis: RIGHT KNEE OSTEOARTHRITIS WITH PAINFUL UNICOMPARTMENTAL   Location: [facility] ROOM 07 / [facility]   Surgeons: Gean Birchwood, MD       DISCUSSION:75 y.o. former smoker with h/o right knee OA scheduled for above procedure 12/01/2020 with Dr. Gean Birchwood.   Case rescheduled due to elevated PTT.  Referred to hematology.   Per last hematology note, "I reviewed recent blood test results with the patient and her daughter Repeat testing confirmed presence of lupus anticoagulant She does not have clinical lupus anticoagulant disorder due to absence of clinical thrombosis There is no contraindication for her to proceed with surgery as she is not at risk of excessive bleeding I will reach out to her orthopedic surgeon for hematological clearance She does not need future follow-up to follow-up on her prolonged APTT"  Anticipate pt can proceed with planned procedure barring acute status change.   VS: BP 126/79   Pulse 83   Temp 36.8 C (Oral)   Resp 16   Ht 4\' 11"  (1.499 m)   Wt 72.1 kg   SpO2 98%   BMI 32.11 kg/m   PROVIDERS: , PA is PCP    LABS: Labs reviewed: Acceptable for surgery. (all labs ordered are listed, but only abnormal results are displayed)  Labs Reviewed  APTT - Abnormal; Notable for the following components:      Result Value   aPTT 47 (*)    All other components within normal limits  SURGICAL PCR SCREEN  CBC WITH DIFFERENTIAL/PLATELET  BASIC METABOLIC PANEL  PROTIME-INR  URINALYSIS, ROUTINE W REFLEX MICROSCOPIC  TYPE AND SCREEN     IMAGES:   EKG:   CV:  Past Medical History:  Diagnosis Date   Arthritis    knees, hands    Past Surgical History:  Procedure Laterality Date   ABDOMINAL HYSTERECTOMY  1995   complication with bowel tear   BREAST BIOPSY     right breast benign  approx 1990   BREAST EXCISIONAL BIOPSY Right    right breast benign 1990   BUNIONECTOMY     EYE SURGERY Bilateral 2014   TONSILLECTOMY     as a child    MEDICATIONS:  acidophilus (RISAQUAD) CAPS capsule   atorvastatin (LIPITOR) 40 MG tablet   buPROPion (WELLBUTRIN XL) 300 MG 24 hr tablet   CALCIUM-VITAMIN D PO   diclofenac (VOLTAREN) 75 MG EC tablet   diclofenac Sodium (VOLTAREN) 1 % GEL   escitalopram (LEXAPRO) 5 MG tablet   Multiple Vitamin (MULTIVITAMIN WITH MINERALS) TABS tablet   Polyethyl Glycol-Propyl Glycol (SYSTANE OP)   traMADol (ULTRAM) 50 MG tablet   traZODone (DESYREL) 50 MG tablet   No current facility-administered medications for this encounter.     1996 Ward, PA-C [facility] Pre-Surgical Testing [phone]

## 2020-11-21 NOTE — Anesthesia Preprocedure Evaluation (Addendum)
Anesthesia Evaluation  Patient identified by MRN, date of birth, ID band Patient awake    Reviewed: Allergy & Precautions, NPO status , Patient's Chart, lab work & pertinent test results  Airway Mallampati: III  TM Distance: >3 FB Neck ROM: Full    Dental  (+) Teeth Intact, Dental Advisory Given   Pulmonary neg pulmonary ROS, former smoker,    Pulmonary exam normal breath sounds clear to auscultation       Cardiovascular Normal cardiovascular exam Rhythm:Regular Rate:Normal  HLD   Neuro/Psych negative neurological ROS     GI/Hepatic negative GI ROS, Neg liver ROS,   Endo/Other  Obese BMI 32  Renal/GU negative Renal ROS  negative genitourinary   Musculoskeletal  (+) Arthritis ,   Abdominal   Peds  Hematology Lupus anticoagulant, hematology clearance   Anesthesia Other Findings   Reproductive/Obstetrics                           Anesthesia Physical Anesthesia Plan  ASA: 2  Anesthesia Plan: Spinal and Regional   Post-op Pain Management:  Regional for Post-op pain   Induction:   PONV Risk Score and Plan: 2 and Treatment may vary due to age or medical condition, Midazolam, Ondansetron, Dexamethasone and Propofol infusion  Airway Management Planned: Natural Airway  Additional Equipment:   Intra-op Plan:   Post-operative Plan:   Informed Consent: I have reviewed the patients History and Physical, chart, labs and discussed the procedure including the risks, benefits and alternatives for the proposed anesthesia with the patient or authorized representative who has indicated his/her understanding and acceptance.     Dental advisory given  Plan Discussed with: CRNA  Anesthesia Plan Comments:        Anesthesia Quick Evaluation

## 2020-11-21 NOTE — Progress Notes (Addendum)
Anesthesia Review:  PCP: Dr Deboraha Sprang- [facility] - LOV note to be faxed - preop clearance ov note 10/29/20 on chart  Cardiologist : none  DR gorsuch- LOv 11/13/20- Abnormal  coagulaton - clearance for surgery in note Chest x-ray : 10/26/20 EKG : 10/29/20 - on chart  Echo : Stress test: Cardiac Cath :  Activity level: can do a flight of stairs withoiiuyt difficulty  Sleep Study/ CPAP : none  Fasting Blood Sugar :      / Checks Blood Sugar -- times a day:   Blood Thinner/ Instructions /Last Dose: ASA / Instructions/ Last Dose :   11/03/20- surgery cancelled due to elevated PTT and seen by DR Bertis Ruddy.   Covid test on 11/27/20  PTT done 11/21/20 routed to Dr Turner Daniels.

## 2020-11-27 ENCOUNTER — Other Ambulatory Visit: Payer: Self-pay | Admitting: Orthopedic Surgery

## 2020-11-27 LAB — SARS CORONAVIRUS 2 (TAT 6-24 HRS): SARS Coronavirus 2: NEGATIVE

## 2020-11-27 NOTE — H&P (Signed)
TOTAL KNEE REVISION ADMISSION H&P  Patient is being admitted for right revision total knee arthroplasty.  Subjective:  Chief Complaint:right knee pain.  HPI: Morgan Reed, 75 y.o. female, has a history of pain and functional disability in the right knee(s) due to failed previous arthroplasty and patient has failed non-surgical conservative treatments for greater than 12 weeks to include NSAID's and/or analgesics, flexibility and strengthening excercises, use of assistive devices, weight reduction as appropriate, and activity modification. The indications for the revision of the total knee arthroplasty are bearing surface wear leading to symptomatic synovitis, implant or knee misalignment, and tibiofemoral instability. Onset of symptoms was gradual starting 1 years ago with gradually worsening course since that time.  Prior procedures on the right knee(s) include unicompartmental arthroplasty.  Patient currently rates pain in the right knee(s) at 10 out of 10 with activity. There is worsening of pain with activity and weight bearing and pain that interferes with activities of daily living.    This condition presents safety issues increasing the risk of falls.  There is no current active infection.  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   Current Outpatient Medications  Medication Sig Dispense Refill Last Dose   acidophilus (RISAQUAD) CAPS capsule Take 1 capsule by mouth daily.      atorvastatin (LIPITOR) 40 MG tablet Take 40 mg by mouth daily.      buPROPion (WELLBUTRIN XL) 300 MG 24 hr tablet Take 300 mg by mouth every morning.      escitalopram (LEXAPRO) 5 MG tablet Take 5 mg by mouth daily.      Polyethyl Glycol-Propyl Glycol (SYSTANE OP) Place 1 drop into both eyes 2 (two) times daily as needed (dry eyes).      traMADol (ULTRAM) 50 MG tablet Take 50 mg by mouth 2 (two) times daily as needed for pain.      traZODone (DESYREL) 50 MG tablet Take 50 mg by mouth at bedtime.      CALCIUM-VITAMIN D PO Take 1 tablet by mouth daily. (Patient not taking: Reported on 11/14/2020)   Not Taking   diclofenac (VOLTAREN) 75 MG EC tablet Take 75 mg by mouth daily. (Patient not taking: Reported on 11/14/2020)   Not Taking   diclofenac Sodium (VOLTAREN) 1 % GEL Apply 2 g topically 4 (four) times daily as needed (pain). (Patient not taking: Reported on 11/14/2020)   Not Taking   Multiple Vitamin (MULTIVITAMIN WITH MINERALS) TABS tablet Take 1 tablet by mouth daily. (Patient not taking: Reported on 11/14/2020)   Not Taking   No Known Allergies  Social History   Tobacco Use   Smoking status: Former  Years: 5.00    Types: Cigarettes    Quit date: 75    Years since quitting: 44.7   Smokeless tobacco: Never  Substance Use Topics   Alcohol use: Yes    Comment: rare    Family History  Problem Relation Age of Onset   Cancer Mother        leukemia   Cancer Brother        liver cancer      Review of Systems  Constitutional: Negative.   HENT: Negative.    Eyes: Negative.   Respiratory: Negative.    Cardiovascular: Negative.   Gastrointestinal: Negative.   Endocrine: Negative.   Genitourinary: Negative.   Musculoskeletal:  Positive for  arthralgias.  Allergic/Immunologic: Negative.   Neurological: Negative.   Hematological: Negative.   Psychiatric/Behavioral: Negative.      Objective:  Physical Exam Constitutional:      Appearance: Normal appearance. She is obese.  HENT:     Head: Normocephalic and atraumatic.     Nose: Nose normal.  Eyes:     Pupils: Pupils are equal, round, and reactive to light.  Pulmonary:     Effort: Pulmonary effort is normal.  Musculoskeletal:        General: Tenderness present.     Cervical back: Normal range of motion and neck supple.     Comments: Surgical scar to the right knee is well-healed no effusion range of motion is 0/120 slight laxity to valgus stress with a good endpoint.    Skin:    General: Skin is warm and dry.  Neurological:     General: No focal deficit present.     Mental Status: She is alert and oriented to person, place, and time. Mental status is at baseline.  Psychiatric:        Mood and Affect: Mood normal.        Behavior: Behavior normal.        Thought Content: Thought content normal.        Judgment: Judgment normal.    Vital signs in last 24 hours:    Labs:  Estimated body mass index is 32.11 kg/m as calculated from the following:   Height as of 11/21/20: 4\' 11"  (1.499 m).   Weight as of 11/21/20: 72.1 kg.  Imaging Review Plain radiographs demonstrate  bilateral AP weightbearing, bilateral Rosenberg, lateral sunrise views of the right and left knee are taken and reviewed in office today.  Patient's right knee does have a unicompartmental knee arthroplasty that appears to be well fixed.  There was a large portion of the tibia removed.  Patient has bone-on-bone arthritis lateral compartment with the [place] view.  Periarticular osteophyte formation.  Patient's left knee has bone-on-bone arthritis medial compartment.    Assessment/Plan:  End stage arthritis, right knee(s) with failed previous arthroplasty.   The patient history, physical  examination, clinical judgment of the provider and imaging studies are consistent with end stage degenerative joint disease of the right knee(s), previous total knee arthroplasty. Revision total knee arthroplasty is deemed medically necessary. The treatment options including medical management, injection therapy, arthroscopy and revision arthroplasty were discussed at length. The risks and benefits of revision total knee arthroplasty were presented and reviewed. The risks due to aseptic loosening, infection, stiffness, patella tracking problems, thromboembolic complications and other imponderables were discussed. The patient acknowledged the explanation, agreed to proceed with the plan and consent was signed. Patient is being admitted for inpatient treatment for surgery, pain control, PT, OT, prophylactic  antibiotics, VTE prophylaxis, progressive ambulation and ADL's and discharge planning.The patient is planning to be discharged home with home health services

## 2020-11-30 MED ORDER — TRANEXAMIC ACID 1000 MG/10ML IV SOLN
2000.0000 mg | INTRAVENOUS | Status: DC
  Filled 2020-11-30: qty 20

## 2020-12-01 ENCOUNTER — Inpatient Hospital Stay (HOSPITAL_COMMUNITY)
Admission: RE | Admit: 2020-12-01 | Discharge: 2020-12-03 | DRG: 467 | Disposition: A | Discharge status: Home Health | Payer: Medicare Other | Attending: Orthopedic Surgery | Admitting: Orthopedic Surgery

## 2020-12-01 ENCOUNTER — Inpatient Hospital Stay (HOSPITAL_COMMUNITY): Payer: Medicare Other | Admitting: Physician Assistant

## 2020-12-01 ENCOUNTER — Encounter (HOSPITAL_COMMUNITY)
Admission: RE | Disposition: A | Discharge status: Home Health | Payer: Self-pay | Source: Home / Self Care | Attending: Orthopedic Surgery

## 2020-12-01 ENCOUNTER — Other Ambulatory Visit: Payer: Self-pay

## 2020-12-01 ENCOUNTER — Encounter (HOSPITAL_COMMUNITY): Payer: Self-pay | Admitting: Orthopedic Surgery

## 2020-12-01 ENCOUNTER — Other Ambulatory Visit (HOSPITAL_COMMUNITY): Payer: Self-pay

## 2020-12-01 DIAGNOSIS — Z87891 Personal history of nicotine dependence: Secondary | ICD-10-CM

## 2020-12-01 DIAGNOSIS — M1711 Unilateral primary osteoarthritis, right knee: Secondary | ICD-10-CM | POA: Diagnosis present

## 2020-12-01 DIAGNOSIS — G8918 Other acute postprocedural pain: Secondary | ICD-10-CM | POA: Diagnosis not present

## 2020-12-01 DIAGNOSIS — D62 Acute posthemorrhagic anemia: Secondary | ICD-10-CM | POA: Diagnosis not present

## 2020-12-01 DIAGNOSIS — E785 Hyperlipidemia, unspecified: Secondary | ICD-10-CM | POA: Diagnosis present

## 2020-12-01 DIAGNOSIS — E669 Obesity, unspecified: Secondary | ICD-10-CM | POA: Diagnosis present

## 2020-12-01 DIAGNOSIS — D6862 Lupus anticoagulant syndrome: Secondary | ICD-10-CM | POA: Diagnosis present

## 2020-12-01 DIAGNOSIS — Z806 Family history of leukemia: Secondary | ICD-10-CM

## 2020-12-01 DIAGNOSIS — T8484XA Pain due to internal orthopedic prosthetic devices, implants and grafts, initial encounter: Principal | ICD-10-CM | POA: Diagnosis present

## 2020-12-01 DIAGNOSIS — Z800 Family history of malignant neoplasm of digestive organs: Secondary | ICD-10-CM

## 2020-12-01 DIAGNOSIS — Z7982 Long term (current) use of aspirin: Secondary | ICD-10-CM

## 2020-12-01 DIAGNOSIS — Y793 Surgical instruments, materials and orthopedic devices (including sutures) associated with adverse incidents: Secondary | ICD-10-CM | POA: Diagnosis present

## 2020-12-01 DIAGNOSIS — Z79899 Other long term (current) drug therapy: Secondary | ICD-10-CM | POA: Diagnosis not present

## 2020-12-01 DIAGNOSIS — Z96651 Presence of right artificial knee joint: Secondary | ICD-10-CM | POA: Diagnosis not present

## 2020-12-01 DIAGNOSIS — G4700 Insomnia, unspecified: Secondary | ICD-10-CM | POA: Diagnosis present

## 2020-12-01 DIAGNOSIS — T84032A Mechanical loosening of internal right knee prosthetic joint, initial encounter: Secondary | ICD-10-CM | POA: Diagnosis not present

## 2020-12-01 DIAGNOSIS — Z6832 Body mass index (BMI) 32.0-32.9, adult: Secondary | ICD-10-CM | POA: Diagnosis not present

## 2020-12-01 HISTORY — PX: TOTAL KNEE REVISION: SHX996

## 2020-12-01 LAB — TYPE AND SCREEN
ABO/RH(D): B POS
Antibody Screen: NEGATIVE

## 2020-12-01 SURGERY — TOTAL KNEE REVISION
Anesthesia: Regional | Site: Knee | Laterality: Right

## 2020-12-01 MED ORDER — ZOLPIDEM TARTRATE 5 MG PO TABS: 5.0000 mg | ORAL_TABLET | Freq: Every evening | ORAL | Status: DC | PRN

## 2020-12-01 MED ORDER — ACETAMINOPHEN 500 MG PO TABS
1000.0000 mg | ORAL_TABLET | Freq: Once | ORAL | Status: AC
  Administered 2020-12-01: 1000 mg via ORAL
  Filled 2020-12-01: qty 2

## 2020-12-01 MED ORDER — PROPOFOL 500 MG/50ML IV EMUL
INTRAVENOUS | Status: DC | PRN
  Administered 2020-12-01: 100 ug/kg/min via INTRAVENOUS

## 2020-12-01 MED ORDER — PHENYLEPHRINE 40 MCG/ML (10ML) SYRINGE FOR IV PUSH (FOR BLOOD PRESSURE SUPPORT)
PREFILLED_SYRINGE | INTRAVENOUS | Status: DC | PRN
  Administered 2020-12-01: 100 ug via INTRAVENOUS
  Administered 2020-12-01 (×2): 80 ug via INTRAVENOUS
  Administered 2020-12-01: 100 ug via INTRAVENOUS
  Administered 2020-12-01: 120 ug via INTRAVENOUS
  Administered 2020-12-01 (×4): 100 ug via INTRAVENOUS
  Administered 2020-12-01: 80 ug via INTRAVENOUS

## 2020-12-01 MED ORDER — ROPIVACAINE HCL 5 MG/ML IJ SOLN
INTRAMUSCULAR | Status: DC | PRN
  Administered 2020-12-01: 20 mL via PERINEURAL

## 2020-12-01 MED ORDER — BUPIVACAINE LIPOSOME 1.3 % IJ SUSP
INTRAMUSCULAR | Status: DC | PRN
  Administered 2020-12-01: 20 mL

## 2020-12-01 MED ORDER — MIDAZOLAM HCL 2 MG/2ML IJ SOLN
INTRAMUSCULAR | Status: AC
  Filled 2020-12-01: qty 2

## 2020-12-01 MED ORDER — ASPIRIN EC 81 MG PO TBEC
81.0000 mg | DELAYED_RELEASE_TABLET | Freq: Two times a day (BID) | ORAL | 0 refills | Status: AC
  Filled 2020-12-01: qty 60, 30d supply, fill #0

## 2020-12-01 MED ORDER — POLYETHYLENE GLYCOL 3350 17 G PO PACK: 17.0000 g | PACK | Freq: Every day | ORAL | Status: DC | PRN

## 2020-12-01 MED ORDER — BUPROPION HCL ER (XL) 300 MG PO TB24
300.0000 mg | ORAL_TABLET | Freq: Every morning | ORAL | Status: DC
  Administered 2020-12-02 – 2020-12-03 (×2): 300 mg via ORAL
  Filled 2020-12-01 (×2): qty 1

## 2020-12-01 MED ORDER — OXYCODONE HCL 5 MG PO TABS
5.0000 mg | ORAL_TABLET | ORAL | Status: DC | PRN
  Administered 2020-12-01: 10 mg via ORAL
  Administered 2020-12-01: 5 mg via ORAL
  Filled 2020-12-01: qty 1
  Filled 2020-12-01: qty 2

## 2020-12-01 MED ORDER — ALUM & MAG HYDROXIDE-SIMETH 200-200-20 MG/5ML PO SUSP: 30.0000 mL | ORAL | Status: DC | PRN

## 2020-12-01 MED ORDER — ONDANSETRON HCL 4 MG PO TABS: 4.0000 mg | ORAL_TABLET | Freq: Four times a day (QID) | ORAL | Status: DC | PRN

## 2020-12-01 MED ORDER — TIZANIDINE HCL 2 MG PO TABS
2.0000 mg | ORAL_TABLET | Freq: Four times a day (QID) | ORAL | 0 refills | Status: DC | PRN
  Filled 2020-12-01: qty 60, 15d supply, fill #0

## 2020-12-01 MED ORDER — STERILE WATER FOR IRRIGATION IR SOLN
Status: DC | PRN
  Administered 2020-12-01: 2000 mL

## 2020-12-01 MED ORDER — CHLORHEXIDINE GLUCONATE 0.12 % MT SOLN
15.0000 mL | Freq: Once | OROMUCOSAL | Status: AC
  Administered 2020-12-01: 15 mL via OROMUCOSAL

## 2020-12-01 MED ORDER — MIDAZOLAM HCL 2 MG/2ML IJ SOLN
1.0000 mg | INTRAMUSCULAR | Status: AC
  Administered 2020-12-01: 1 mg via INTRAVENOUS
  Filled 2020-12-01: qty 2

## 2020-12-01 MED ORDER — METHOCARBAMOL 500 MG IVPB - SIMPLE MED
500.0000 mg | Freq: Four times a day (QID) | INTRAVENOUS | Status: DC | PRN
  Filled 2020-12-01: qty 50

## 2020-12-01 MED ORDER — PROPOFOL 10 MG/ML IV BOLUS
INTRAVENOUS | Status: DC | PRN
  Administered 2020-12-01: 40 mg via INTRAVENOUS

## 2020-12-01 MED ORDER — TRANEXAMIC ACID-NACL 1000-0.7 MG/100ML-% IV SOLN
1000.0000 mg | Freq: Once | INTRAVENOUS | Status: AC
Start: 2020-12-01 — End: 2020-12-01
  Administered 2020-12-01: 1000 mg via INTRAVENOUS
  Filled 2020-12-01: qty 100

## 2020-12-01 MED ORDER — METOCLOPRAMIDE HCL 5 MG/ML IJ SOLN
5.0000 mg | Freq: Three times a day (TID) | INTRAMUSCULAR | Status: DC | PRN
Start: 2020-12-01 — End: 2020-12-03

## 2020-12-01 MED ORDER — BUPIVACAINE-EPINEPHRINE 0.25% -1:200000 IJ SOLN
INTRAMUSCULAR | Status: DC | PRN
  Administered 2020-12-01: 30 mL

## 2020-12-01 MED ORDER — MIDAZOLAM HCL 5 MG/5ML IJ SOLN
INTRAMUSCULAR | Status: DC | PRN
  Administered 2020-12-01: 2 mg via INTRAVENOUS

## 2020-12-01 MED ORDER — BUPIVACAINE LIPOSOME 1.3 % IJ SUSP
INTRAMUSCULAR | Status: AC
  Filled 2020-12-01: qty 20

## 2020-12-01 MED ORDER — ASPIRIN 81 MG PO CHEW
81.0000 mg | CHEWABLE_TABLET | Freq: Two times a day (BID) | ORAL | Status: DC
  Administered 2020-12-01 – 2020-12-03 (×4): 81 mg via ORAL
  Filled 2020-12-01 (×4): qty 1

## 2020-12-01 MED ORDER — BUPIVACAINE-EPINEPHRINE (PF) 0.25% -1:200000 IJ SOLN
INTRAMUSCULAR | Status: AC
  Filled 2020-12-01: qty 30

## 2020-12-01 MED ORDER — BUPIVACAINE IN DEXTROSE 0.75-8.25 % IT SOLN
INTRATHECAL | Status: DC | PRN
  Administered 2020-12-01: 1.6 mL via INTRATHECAL

## 2020-12-01 MED ORDER — TRAZODONE HCL 50 MG PO TABS
50.0000 mg | ORAL_TABLET | Freq: Every day | ORAL | Status: DC
  Administered 2020-12-01 – 2020-12-02 (×2): 50 mg via ORAL
  Filled 2020-12-01 (×2): qty 1

## 2020-12-01 MED ORDER — FLEET ENEMA 7-19 GM/118ML RE ENEM
1.0000 | ENEMA | Freq: Once | RECTAL | Status: DC | PRN
Start: 2020-12-01 — End: 2020-12-03

## 2020-12-01 MED ORDER — DIPHENHYDRAMINE HCL 12.5 MG/5ML PO ELIX: 12.5000 mg | ORAL_SOLUTION | ORAL | Status: DC | PRN

## 2020-12-01 MED ORDER — ACETAMINOPHEN 325 MG PO TABS
325.0000 mg | ORAL_TABLET | Freq: Four times a day (QID) | ORAL | Status: DC | PRN
  Administered 2020-12-01: 650 mg via ORAL
  Filled 2020-12-01: qty 2

## 2020-12-01 MED ORDER — MENTHOL 3 MG MT LOZG: 1.0000 | LOZENGE | OROMUCOSAL | Status: DC | PRN

## 2020-12-01 MED ORDER — ONDANSETRON HCL 4 MG/2ML IJ SOLN
INTRAMUSCULAR | Status: DC | PRN
  Administered 2020-12-01: 4 mg via INTRAVENOUS

## 2020-12-01 MED ORDER — DEXAMETHASONE SODIUM PHOSPHATE 10 MG/ML IJ SOLN
INTRAMUSCULAR | Status: DC | PRN
  Administered 2020-12-01: 5 mg

## 2020-12-01 MED ORDER — LACTATED RINGERS IV SOLN
INTRAVENOUS | Status: DC
  Administered 2020-12-01: 1000 mL via INTRAVENOUS

## 2020-12-01 MED ORDER — PHENOL 1.4 % MT LIQD: 1.0000 | OROMUCOSAL | Status: DC | PRN

## 2020-12-01 MED ORDER — ESCITALOPRAM OXALATE 10 MG PO TABS
5.0000 mg | ORAL_TABLET | Freq: Every day | ORAL | Status: DC
  Administered 2020-12-02 – 2020-12-03 (×2): 5 mg via ORAL
  Filled 2020-12-01 (×2): qty 1

## 2020-12-01 MED ORDER — 0.9 % SODIUM CHLORIDE (POUR BTL) OPTIME
TOPICAL | Status: DC | PRN
  Administered 2020-12-01: 1000 mL

## 2020-12-01 MED ORDER — FENTANYL CITRATE PF 50 MCG/ML IJ SOSY: 25.0000 ug | PREFILLED_SYRINGE | INTRAMUSCULAR | Status: DC | PRN

## 2020-12-01 MED ORDER — SODIUM CHLORIDE (PF) 0.9 % IJ SOLN
INTRAMUSCULAR | Status: DC | PRN
  Administered 2020-12-01: 70 mL

## 2020-12-01 MED ORDER — POLYVINYL ALCOHOL 1.4 % OP SOLN
Freq: Two times a day (BID) | OPHTHALMIC | Status: DC | PRN
  Filled 2020-12-01: qty 15

## 2020-12-01 MED ORDER — ONDANSETRON HCL 4 MG/2ML IJ SOLN: 4.0000 mg | Freq: Four times a day (QID) | INTRAMUSCULAR | Status: DC | PRN

## 2020-12-01 MED ORDER — OXYCODONE-ACETAMINOPHEN 5-325 MG PO TABS
1.0000 | ORAL_TABLET | ORAL | 0 refills | Status: DC | PRN
  Filled 2020-12-01: qty 30, 5d supply, fill #0

## 2020-12-01 MED ORDER — TRANEXAMIC ACID 1000 MG/10ML IV SOLN
INTRAVENOUS | Status: DC | PRN
  Administered 2020-12-01: 2000 mg via TOPICAL

## 2020-12-01 MED ORDER — KCL IN DEXTROSE-NACL 20-5-0.45 MEQ/L-%-% IV SOLN
INTRAVENOUS | Status: DC
  Filled 2020-12-01 (×2): qty 1000

## 2020-12-01 MED ORDER — TRANEXAMIC ACID-NACL 1000-0.7 MG/100ML-% IV SOLN
1000.0000 mg | INTRAVENOUS | Status: AC
  Administered 2020-12-01: 1000 mg via INTRAVENOUS
  Filled 2020-12-01: qty 100

## 2020-12-01 MED ORDER — CEFAZOLIN SODIUM-DEXTROSE 2-4 GM/100ML-% IV SOLN
2.0000 g | INTRAVENOUS | Status: AC
  Administered 2020-12-01: 2 g via INTRAVENOUS
  Filled 2020-12-01: qty 100

## 2020-12-01 MED ORDER — METHOCARBAMOL 500 MG PO TABS
500.0000 mg | ORAL_TABLET | Freq: Four times a day (QID) | ORAL | Status: DC | PRN
  Administered 2020-12-01 – 2020-12-03 (×4): 500 mg via ORAL
  Filled 2020-12-01 (×4): qty 1

## 2020-12-01 MED ORDER — SODIUM CHLORIDE (PF) 0.9 % IJ SOLN
INTRAMUSCULAR | Status: AC
  Filled 2020-12-01: qty 20

## 2020-12-01 MED ORDER — OXYCODONE HCL 5 MG PO TABS
10.0000 mg | ORAL_TABLET | ORAL | Status: DC | PRN
  Administered 2020-12-01 – 2020-12-03 (×9): 15 mg via ORAL
  Filled 2020-12-01 (×9): qty 3

## 2020-12-01 MED ORDER — BUPIVACAINE LIPOSOME 1.3 % IJ SUSP: 20.0000 mL | Freq: Once | INTRAMUSCULAR | Status: DC

## 2020-12-01 MED ORDER — DOCUSATE SODIUM 100 MG PO CAPS
100.0000 mg | ORAL_CAPSULE | Freq: Two times a day (BID) | ORAL | Status: DC
Start: 2020-12-01 — End: 2020-12-03
  Administered 2020-12-01 – 2020-12-03 (×4): 100 mg via ORAL
  Filled 2020-12-01 (×4): qty 1

## 2020-12-01 MED ORDER — BISACODYL 5 MG PO TBEC: 5.0000 mg | DELAYED_RELEASE_TABLET | Freq: Every day | ORAL | Status: DC | PRN

## 2020-12-01 MED ORDER — HYDROMORPHONE HCL 1 MG/ML IJ SOLN
0.5000 mg | INTRAMUSCULAR | Status: DC | PRN
  Administered 2020-12-02 – 2020-12-03 (×4): 1 mg via INTRAVENOUS
  Filled 2020-12-01 (×4): qty 1

## 2020-12-01 MED ORDER — FENTANYL CITRATE PF 50 MCG/ML IJ SOSY
50.0000 ug | PREFILLED_SYRINGE | INTRAMUSCULAR | Status: AC
  Administered 2020-12-01: 50 ug via INTRAVENOUS
  Filled 2020-12-01: qty 2

## 2020-12-01 MED ORDER — DEXAMETHASONE SODIUM PHOSPHATE 10 MG/ML IJ SOLN
INTRAMUSCULAR | Status: DC | PRN
  Administered 2020-12-01: 8 mg via INTRAVENOUS

## 2020-12-01 MED ORDER — PANTOPRAZOLE SODIUM 40 MG PO TBEC
40.0000 mg | DELAYED_RELEASE_TABLET | Freq: Every day | ORAL | Status: DC
Start: 2020-12-01 — End: 2020-12-03
  Administered 2020-12-01 – 2020-12-03 (×3): 40 mg via ORAL
  Filled 2020-12-01 (×3): qty 1

## 2020-12-01 MED ORDER — METOCLOPRAMIDE HCL 5 MG PO TABS: 5.0000 mg | ORAL_TABLET | Freq: Three times a day (TID) | ORAL | Status: DC | PRN

## 2020-12-01 MED ORDER — POVIDONE-IODINE 10 % EX SWAB
2.0000 "application " | Freq: Once | CUTANEOUS | Status: AC
  Administered 2020-12-01: 2 via TOPICAL

## 2020-12-01 SURGICAL SUPPLY — 63 items
ATTUNE PS FEM RT SZ 5 CEM KNEE (Femur) ×2 IMPLANT
ATTUNE PSRP INSE SZ5 7 KNEE (Insert) ×2 IMPLANT
AUG TIB ATTUNE UNV SZ5 6X5 (Joint) ×2 IMPLANT
AUGMENT TIB ATTUNE UNV SZ5 6X5 (Joint) ×1 IMPLANT
BAG COUNTER SPONGE SURGICOUNT (BAG) IMPLANT
BAG DECANTER FOR FLEXI CONT (MISCELLANEOUS) ×2 IMPLANT
BAG ZIPLOCK 12X15 (MISCELLANEOUS) ×2 IMPLANT
BLADE OSCILLATING/SAGITTAL (BLADE) ×2
BLADE SAG 18X100X1.27 (BLADE) ×2 IMPLANT
BLADE SAW SGTL 11.0X1.19X90.0M (BLADE) ×2 IMPLANT
BLADE SAW SGTL 81X20 HD (BLADE) ×4 IMPLANT
BLADE SURG SZ10 CARB STEEL (BLADE) ×4 IMPLANT
BLADE SW THK.38XMED LNG THN (BLADE) ×2 IMPLANT
BNDG ELASTIC 6X10 VLCR STRL LF (GAUZE/BANDAGES/DRESSINGS) ×2 IMPLANT
BOWL SMART MIX CTS (DISPOSABLE) ×2 IMPLANT
BRUSH FEMORAL CANAL (MISCELLANEOUS) ×2 IMPLANT
BUR OVAL CARBIDE 4.0 (BURR) IMPLANT
CEMENT HV SMART SET (Cement) ×4 IMPLANT
COVER SURGICAL LIGHT HANDLE (MISCELLANEOUS) ×2 IMPLANT
CUFF TOURN SGL QUICK 34 (TOURNIQUET CUFF) ×1
CUFF TRNQT CYL 34X4.125X (TOURNIQUET CUFF) ×1 IMPLANT
DECANTER SPIKE VIAL GLASS SM (MISCELLANEOUS) ×6 IMPLANT
DRAPE INCISE IOBAN 66X45 STRL (DRAPES) ×6 IMPLANT
DRAPE ORTHO SPLIT 77X108 STRL (DRAPES) ×2
DRAPE SURG ORHT 6 SPLT 77X108 (DRAPES) ×2 IMPLANT
DRAPE U-SHAPE 47X51 STRL (DRAPES) ×2 IMPLANT
DRESSING AQUACEL AG SP 3.5X10 (GAUZE/BANDAGES/DRESSINGS) IMPLANT
DRSG AQUACEL AG ADV 3.5X10 (GAUZE/BANDAGES/DRESSINGS) ×2 IMPLANT
DRSG AQUACEL AG ADV 3.5X14 (GAUZE/BANDAGES/DRESSINGS) IMPLANT
DRSG AQUACEL AG SP 3.5X10 (GAUZE/BANDAGES/DRESSINGS)
DURAPREP 26ML APPLICATOR (WOUND CARE) ×2 IMPLANT
ELECT REM PT RETURN 15FT ADLT (MISCELLANEOUS) ×2 IMPLANT
GLOVE SRG 8 PF TXTR STRL LF DI (GLOVE) ×1 IMPLANT
GLOVE SURG ENC MOIS LTX SZ7.5 (GLOVE) ×2 IMPLANT
GLOVE SURG ENC MOIS LTX SZ8.5 (GLOVE) ×2 IMPLANT
GLOVE SURG UNDER POLY LF SZ8 (GLOVE) ×1
GLOVE SURG UNDER POLY LF SZ9 (GLOVE) ×2 IMPLANT
GOWN STRL REUS W/TWL XL LVL3 (GOWN DISPOSABLE) ×4 IMPLANT
HANDPIECE INTERPULSE COAX TIP (DISPOSABLE) ×1
HOOD PEEL AWAY FLYTE STAYCOOL (MISCELLANEOUS) ×6 IMPLANT
KIT TURNOVER KIT A (KITS) ×2 IMPLANT
NEEDLE HYPO 21X1.5 SAFETY (NEEDLE) ×4 IMPLANT
NS IRRIG 1000ML POUR BTL (IV SOLUTION) ×2 IMPLANT
PACK TOTAL KNEE CUSTOM (KITS) ×2 IMPLANT
PATELLA MEDIAL ATTUN 35MM KNEE (Knees) ×2 IMPLANT
PIN DRILL FIX HALF THREAD (BIT) ×2 IMPLANT
PIN STEINMAN FIXATION KNEE (PIN) ×2 IMPLANT
PLATE REV TIB BAS ROT SZ5 KNEE (Orthopedic Implant) ×1 IMPLANT
PROTECTOR NERVE ULNAR (MISCELLANEOUS) ×2 IMPLANT
REV TIB BASE ROT PLAT SZ5 KNEE (Orthopedic Implant) ×2 IMPLANT
SET HNDPC FAN SPRY TIP SCT (DISPOSABLE) ×1 IMPLANT
STAPLER VISISTAT 35W (STAPLE) IMPLANT
STEM KNEE ATTUNE 12X110MM (Stem) ×2 IMPLANT
SUT VIC AB 1 CTX 36 (SUTURE) ×1
SUT VIC AB 1 CTX36XBRD ANBCTR (SUTURE) ×1 IMPLANT
SUT VIC AB 3-0 CT1 27 (SUTURE) ×1
SUT VIC AB 3-0 CT1 TAPERPNT 27 (SUTURE) ×1 IMPLANT
SWAB COLLECTION DEVICE MRSA (MISCELLANEOUS) IMPLANT
SWAB CULTURE ESWAB REG 1ML (MISCELLANEOUS) IMPLANT
SYR CONTROL 10ML LL (SYRINGE) ×4 IMPLANT
TRAY FOLEY MTR SLVR 16FR STAT (SET/KITS/TRAYS/PACK) ×2 IMPLANT
WATER STERILE IRR 1000ML POUR (IV SOLUTION) ×4 IMPLANT
WRAP KNEE MAXI GEL POST OP (GAUZE/BANDAGES/DRESSINGS) ×2 IMPLANT

## 2020-12-01 NOTE — Evaluation (Signed)
Physical Therapy Evaluation Patient Details Name: Morgan Reed MRN: [id number] DOB: Jan 11, 1946 Today's Date: 12/01/2020  History of Present Illness  pt s/p R TKA revision from previsou unicompartmental knee on 2013.  Clinical Impression  Pt tolerated session well ambulating in room with step to pattern with very little pain. Will continue to edu with HEP and ambulation tomorrow with anticipation of DC from PT standpoint after 1-2 sessions.        Recommendations for follow up therapy are one component of a multi-disciplinary discharge planning process, led by the attending physician.  Recommendations may be updated based on patient status, additional functional criteria and insurance authorization.  Follow Up Recommendations Follow surgeon's recommendation for DC plan and follow-up therapies (MD note states HHPT fpor a week then OP)    Equipment Recommendations  Rolling walker with 5" wheels    Recommendations for Other Services       Precautions / Restrictions Precautions Precautions: Knee Precaution Comments: educated with knee precautions and no pillow under knee and use of bone foam and demonstration as well for proper positioning of RLE Restrictions Weight Bearing Restrictions: No      Mobility  Bed Mobility Overal bed mobility: Modified Independent                  Transfers Overall transfer level: Needs assistance Equipment used: Rolling walker (2 wheeled) Transfers: Sit to/from Stand Sit to Stand: Min guard         General transfer comment: cuea for RW safety  Ambulation/Gait Ambulation/Gait assistance: Min guard Gait Distance (Feet): 20 Feet Assistive device: Rolling walker (2 wheeled) Gait Pattern/deviations: Step-to pattern     General Gait Details: tolerated in room ambulation well today will prgress nicely tomorrow to hallway.  Stairs            Wheelchair Mobility    Modified Rankin (Stroke Patients Only)       Balance Overall  balance assessment: Needs assistance Sitting-balance support: No upper extremity supported;Feet supported Sitting balance-Leahy Scale: Good     Standing balance support: During functional activity;Bilateral upper extremity supported Standing balance-Leahy Scale: Fair                               Pertinent Vitals/Pain Pain Assessment: 0-10 Pain Score: 3  Pain Location: R knee Pain Descriptors / Indicators: Aching;Sore Pain Intervention(s): Limited activity within patient's tolerance;Monitored during session;Ice applied    Home Living Family/patient expects to be discharged to:: Private residence Living Arrangements: Children (lives with dtr and Information systems manager) Available Help at Discharge: Family Type of Home: House Home Access: Level entry     Home Layout: One level;Able to live on main level with bedroom/bathroom Home Equipment: Gilmer Mor - single point      Prior Function Level of Independence: Independent with assistive device(s)         Comments: was becomign more limited with walking and used cane for ambulation due to pain.     Hand Dominance        Extremity/Trunk Assessment        Lower Extremity Assessment Lower Extremity Assessment: RLE deficits/detail RLE Deficits / Details: able to SLR and perfrom a good quad set , limited knee flexion to 60 degrees supine due to pain. , however when sat edge of bed was able to sit and 90 degree flexion along side of other LE with foot flat.       Communication  Communication: No difficulties  Cognition Arousal/Alertness: Awake/alert Behavior During Therapy: WFL for tasks assessed/performed Overall Cognitive Status: Within Functional Limits for tasks assessed                                        General Comments      Exercises Total Joint Exercises Ankle Circles/Pumps: AROM;10 reps;Supine;Both Quad Sets: AROM;Supine;Right;10 reps Heel Slides: AAROM;Supine;Right;10 reps Straight  Leg Raises: AAROM;Supine;Right;10 reps   Assessment/Plan    PT Assessment Patient needs continued PT services  PT Problem List Decreased strength;Decreased mobility;Decreased range of motion       PT Treatment Interventions DME instruction;Therapeutic activities;Therapeutic exercise;Gait training;Functional mobility training    PT Goals (Current goals can be found in the Care Plan section)  Acute Rehab PT Goals Patient Stated Goal: I want to move again without pain PT Goal Formulation: With patient Time For Goal Achievement: 12/08/20 Potential to Achieve Goals: Good    Frequency 7X/week   Barriers to discharge        Co-evaluation               AM-PAC PT "6 Clicks" Mobility  Outcome Measure Help needed turning from your back to your side while in a flat bed without using bedrails?: None Help needed moving from lying on your back to sitting on the side of a flat bed without using bedrails?: A Little Help needed moving to and from a bed to a chair (including a wheelchair)?: A Little Help needed standing up from a chair using your arms (e.g., wheelchair or bedside chair)?: A Little Help needed to walk in hospital room?: A Little Help needed climbing 3-5 steps with a railing? : A Little 6 Click Score: 19    End of Session Equipment Utilized During Treatment: Gait belt Activity Tolerance: Patient tolerated treatment well Patient left: in chair;with call Bier[place]/phone within reach;with family/visitor present Nurse Communication: Mobility status PT Visit Diagnosis: Other abnormalities of gait and mobility (R26.89)    Time: 9163-8466 PT Time Calculation (min) (ACUTE ONLY): 30 min   Charges:   PT Evaluation $PT Eval Low Complexity: 1 Low PT Treatments $Gait Training: 8-22 mins        Tavonna Worthington, PT, MPT Acute Rehabilitation Services Office: [phone] Pager: [phone] 12/01/2020     Marella Bile 12/01/2020, 7:50 PM

## 2020-12-01 NOTE — Progress Notes (Signed)
Orthopedic Tech Progress Note Patient Details:  [name] June 24, 1945 [id number]  Ortho Devices Type of Ortho Device: Bone foam zero knee      Saul Fordyce 12/01/2020, 2:14 PM

## 2020-12-01 NOTE — Discharge Instructions (Signed)

## 2020-12-01 NOTE — Op Note (Signed)
PATIENT ID:      Morgan Reed  MRN:     [id number] DOB/AGE:    74-Dec-1947 / 75 y.o.       OPERATIVE REPORT   DATE OF PROCEDURE:  12/01/2020      PREOPERATIVE DIAGNOSIS:   RIGHT KNEE OSTEOARTHRITIS WITH PAINFUL UNICOMPARTMENTAL      Estimated body mass index is 32.1 kg/m as calculated from the following:   Height as of this encounter: 4\' 11"  (1.499 m).   Weight as of this encounter: 72.1 kg.                                                       POSTOPERATIVE DIAGNOSIS: Same                                                                  PROCEDURE: Revision right total knee arthroplasty with removal of unicompartmental total knee and placement of DePuy attune 5 right femur, 5 attune revision tibial baseplate with 12 mm x 110 mm stem and a 5 mm medial augment.  7 mm RP bearing, 35 mm patellar button, double batch DePuy HV cement    SURGEON:  ASSISTANT:   Nestor Lewandowsky. Tomi Likens   (Present and scrubbed throughout the case, critical for assistance with exposure, retraction, instrumentation, and closure.)        ANESTHESIA: Spinal, 20cc Exparel, 50cc 0.25% Marcaine EBL: 400 cc cc FLUID REPLACEMENT: 1800 cc cc crystaloid TOURNIQUET: 30 minutes DRAINS: None TRANEXAMIC ACID: 1gm IV, 2gm topical COMPLICATIONS:  None         INDICATIONS FOR PROCEDURE: 75 year old female who had unicompartmental knee placed over 5 years ago and has progressively increasing pain over the last 2 to 3 years.  Pain wakes her up at night interferes with walking activities.  Plain x-rays show fairly large cut on the tibia with a thick bearing and she has fallen into some varus.  Bone scan shows increased uptake along the implants medially.  She also has some peripatellar pain consistent with osteoarthritis.  No fevers or chills or wound problems.  She desires elective revision to total knee arthroplasty.  Risks and benefits of surgery been discussed and all questions answered.    DESCRIPTION OF PROCEDURE:  The patient identified by armband, received  IV antibiotics, in the holding area at [facility]. Patient taken to the operating room, appropriate anesthetic monitors were attached, and spinal anesthesia was  induced. IV Tranexamic acid was given.Tourniquet applied high to the operative thigh. Lateral post and foot positioner applied to the table, the lower extremity was then prepped and draped in usual sterile fashion from the toes to the tourniquet. Time-out procedure was performed. The skin and subcutaneous tissue along the incision was injected with 20 cc of a mixture of Exparel and Marcaine solution, using a 20-gauge by 1-1/2 inch needle. We began the operation, with the knee flexed 130 degrees, by making the anterior midline incision starting at handbreadth above the patella going over the patella 1 cm medial to and 4 cm distal to the tibial tubercle.  Small bleeders in the skin and the subcutaneous tissue identified and cauterized. Transverse retinaculum was incised and reflected medially and a medial parapatellar arthrotomy was accomplished.  Joint fluid was clear with no evidence of infection or inflammation.  Scar tissue was removed from the prepatellar fat pad region the medial superficial collateral ligament was dissected off the anterior medial flare of the tibia around posteriorly giving [place] good exposure of the unicompartmental tibial component femoral component and floating bearing which was removed.  We are able to evert the patella hyperflexed the knee giving [place] better exposure.  At this time we removed the tibial implant with a small ACL saw removing minimal amounts of normal bone.  We then instrumented the proximal tibia with the starter drill followed by sequential reaming up to a 12 mm reamer to the appropriate depth or 110 mm stem.  We then overreamed to 13 mm three fourths of the way down.  It was obvious we would need a 5 mm wedge medially and using the 12 mm reamer as a guide we then  affixed the proximal tibial cutting guide for the revision system to the reamer.  We removed about 5 mm of bone laterally and on the medial side there was still a 5 mm gap that we cleaned up for a 5 mm augment.  We sized for a #5 tibial baseplate placed the tower and the boss reamer.  We then assembled a trial with a 12 mm trial stem and a 5 mm baseplate and hammered this into place followed by the delta fin keel cut.  With the trial in place we then directed our attention to the femur which is instrumented with the IM drill followed by the distal femoral cutting guide pinned at the appropriate rotation along the epicondylar axis.  The guide was set at 5 degrees right with a 9 mm distal cut.  This was accomplished without difficulty.  We then placed a 7 mm shim medially and sized for a #5 right femoral component the guide was pinned into place and 0 degrees of rotation followed by the #5 cutting block which was pinned into place.  We then performed the anterior posterior and chamfer cuts without difficulty followed by the number 5 box cutter.  Our extension gap was then checked at 7 mm.  Flexion gap was also approximately 7 mm.  A #5 right femoral trial was hammered into place and the lugs drilled.  A 7 mm RP bearing trial was placed in the knee taken through range of motion with good stability and full extension and full flexion.  The knee was then held in 30 degrees flexion and the patellar cutting guide affixed posterior 9 mm of the patella resected size for 35 mm button and drilled.  At this time the knee was held in extension wrapped with an Esmarch bandage and the tourniquet inflated to 300 mmHg in preparation for cementing.  At the back table we assembled a 5 mm DePuy attune revision baseplate with a 12 mm x 110 mm stem and a medial 5 mm augment was screwed into place as well.  A double batch of DePuy HV cement was then mixed and applied to all bony metallic surfaces after they had been [employer] to clean and  dried.  The tibial implant was then hammered into place with good fit and fill excess cement was removed.  The 5 right femoral component was then hammered into place and excess cement removed followed by the 7  mm all polyethylene attune RP bearing.  A 35 mm patellar button was then squeezed into place and excess cement removed.  The knee was then held at 30 degrees flexion while the cement cured.  The wound was then [facility] to clean and dried with suction.  The rest of the Exparel was then injected in the soft tissues anteriorly medially laterally and posteriorly.  Tourniquet was let down no active bleeding was noted.  The knee was held at 30 flexion with compression, while the cement cured. The wound was irrigated out with normal saline solution pulse lavage. The rest of the Exparel was injected into the parapatellar arthrotomy, subcutaneous tissues, and periosteal tissues. The parapatellar arthrotomy was closed with running #1 Vicryl suture. The subcutaneous tissue with 0 and 2-0 undyed Vicryl suture, and the skin with running 3-0 SQ vicryl. An Aquacil and Ace wrap were applied. The patient was taken to recovery room without difficulty.   Nestor Lewandowsky 12/01/2020, 8:47 AM

## 2020-12-01 NOTE — Anesthesia Procedure Notes (Signed)
Anesthesia Regional Block: Adductor canal block   Pre-Anesthetic Checklist: , timeout performed,  Correct Patient, Correct Site, Correct Laterality,  Correct Procedure, Correct Position, site marked,  Risks and benefits discussed,  Surgical consent,  Pre-op evaluation,  At surgeon's request and post-op pain management  Laterality: Right  Prep: Maximum Sterile Barrier Precautions used, chloraprep       Needles:  Injection technique: Single-shot  Needle Type: Echogenic Stimulator Needle     Needle Length: 9cm  Needle Gauge: 22     Additional Needles:   Procedures:,,,, ultrasound used (permanent image in chart),,    Narrative:  Start time: 12/01/2020 9:18 AM End time: 12/01/2020 9:22 AM Injection made incrementally with aspirations every 5 mL.  Performed by: Personally  Anesthesiologist: Elmer Picker, MD  Additional Notes: Monitors applied. No increased pain on injection. No increased resistance to injection. Injection made in 5cc increments. Good needle visualization. Patient tolerated procedure well.

## 2020-12-01 NOTE — Anesthesia Postprocedure Evaluation (Signed)
Anesthesia Post Note  Patient: Morgan Reed  Procedure(s) Performed: RIGHT TOTAL KNEE REVISION (Right: Knee)     Patient location during evaluation: PACU Anesthesia Type: Regional and Spinal Level of consciousness: awake and alert Pain management: pain level controlled Vital Signs Assessment: post-procedure vital signs reviewed and stable Respiratory status: spontaneous breathing and respiratory function stable Cardiovascular status: blood pressure returned to baseline and stable Postop Assessment: spinal receding Anesthetic complications: no   No notable events documented.  Last Vitals:  Vitals:   12/01/20 1345 12/01/20 1400  BP: 102/73 112/71  Pulse: 63 72  Resp: 20 13  Temp:    SpO2: 100% 100%    Last Pain:  Vitals:   12/01/20 1330  TempSrc:   PainSc: 0-No pain                 Calton Harshfield DANIEL

## 2020-12-01 NOTE — Transfer of Care (Signed)
Immediate Anesthesia Transfer of Care Note  Patient: Morgan Reed  Procedure(s) Performed: RIGHT TOTAL KNEE REVISION (Right: Knee)  Patient Location: PACU  Anesthesia Type:Spinal  Level of Consciousness: awake, alert , oriented and patient cooperative  Airway & Oxygen Therapy: Patient Spontanous Breathing and Patient connected to face mask  Post-op Assessment: Report given to RN and Post -op Vital signs reviewed and stable  Post vital signs: Reviewed and stable  Last Vitals:  Vitals Value Taken Time  BP 108/66 12/01/20 1311  Temp    Pulse 78 12/01/20 1313  Resp 16 12/01/20 1313  SpO2 100 % 12/01/20 1313  Vitals shown include unvalidated device data.  Last Pain:  Vitals:   12/01/20 0811  TempSrc:   PainSc: 5       Patients Stated Pain Goal: 4 (12/01/20 1093)  Complications: No notable events documented.

## 2020-12-01 NOTE — Progress Notes (Signed)
AssistedDr. Chelsey Woodrum with right, ultrasound guided, adductor canal block. Side rails up, monitors on throughout procedure. See vital signs in flow sheet. Tolerated Procedure well.  

## 2020-12-01 NOTE — Interval H&P Note (Signed)
History and Physical Interval Note:  12/01/2020 8:46 AM  Morgan Reed  has presented today for surgery, with the diagnosis of RIGHT KNEE OSTEOARTHRITIS WITH PAINFUL UNICOMPARTMENTAL.  The various methods of treatment have been discussed with the patient and family. After consideration of risks, benefits and other options for treatment, the patient has consented to  Procedure(s): RIGHT TOTAL KNEE REVISION (Right) as a surgical intervention.  The patient's history has been reviewed, patient examined, no change in status, stable for surgery.  I have reviewed the patient's chart and labs.  Questions were answered to the patient's satisfaction.     Nestor Lewandowsky

## 2020-12-01 NOTE — Anesthesia Procedure Notes (Signed)
Spinal  Patient location during procedure: OR Start time: 12/01/2020 10:32 AM End time: 12/01/2020 10:35 AM Reason for block: surgical anesthesia Staffing Performed: anesthesiologist  Anesthesiologist: Gaynelle Adu, MD Preanesthetic Checklist Completed: patient identified, IV checked, risks and benefits discussed, surgical consent, monitors and equipment checked, pre-op evaluation and timeout performed Spinal Block Patient position: sitting Prep: DuraPrep Patient monitoring: cardiac monitor, continuous pulse ox and blood pressure Approach: midline Location: L3-4 Injection technique: single-shot Needle Needle type: Pencan  Needle gauge: 24 G Needle length: 9 cm Assessment Sensory level: T8 Events: CSF return Additional Notes Functioning IV was confirmed and monitors were applied. Sterile prep and drape, including hand hygiene and sterile gloves were used. The patient was positioned and the spine was prepped. The skin was anesthetized with lidocaine.  Free flow of clear CSF was obtained prior to injecting local anesthetic into the CSF.  The spinal needle aspirated freely following injection.  The needle was carefully withdrawn.  The patient tolerated the procedure well.

## 2020-12-01 NOTE — Plan of Care (Signed)
  Problem: Education: Goal: Knowledge of General Education information will improve Description: Including pain rating scale, medication(s)/side effects and non-pharmacologic comfort measures Outcome: Progressing   Problem: Health Behavior/Discharge Planning: Goal: Ability to manage health-related needs will improve Outcome: Progressing   Problem: Activity: Goal: Risk for activity intolerance will decrease Outcome: Progressing   Problem: Nutrition: Goal: Adequate nutrition will be maintained Outcome: Progressing   Problem: Elimination: Goal: Will not experience complications related to bowel motility Outcome: Progressing   Problem: Pain Managment: Goal: General experience of comfort will improve Outcome: Progressing   Problem: Education: Goal: Knowledge of the prescribed therapeutic regimen will improve Outcome: Progressing   Problem: Activity: Goal: Ability to avoid complications of mobility impairment will improve Outcome: Progressing   Problem: Pain Management: Goal: Pain level will decrease with appropriate interventions Outcome: Progressing

## 2020-12-01 NOTE — Progress Notes (Signed)
PT Note  Patient Details Name: Morgan Reed MRN: [id number] DOB: 1945-07-31   Pt's PT assessment complete, tolerated 1st session tonight ambulating in room with RW at supervision with step through gait pattern. Pt states it is better now that it was before. Full write up to follow.         Marella Bile 12/01/2020, 6:21 PM

## 2020-12-02 ENCOUNTER — Encounter (HOSPITAL_COMMUNITY): Payer: Self-pay | Admitting: Orthopedic Surgery

## 2020-12-02 ENCOUNTER — Other Ambulatory Visit (HOSPITAL_COMMUNITY): Payer: Self-pay

## 2020-12-02 LAB — CBC
HCT: 31.4 % — ABNORMAL LOW (ref 36.0–46.0)
Hemoglobin: 10.4 g/dL — ABNORMAL LOW (ref 12.0–15.0)
MCH: 30.5 pg (ref 26.0–34.0)
MCHC: 33.1 g/dL (ref 30.0–36.0)
MCV: 92.1 fL (ref 80.0–100.0)
Platelets: 221 10*3/uL (ref 150–400)
RBC: 3.41 MIL/uL — ABNORMAL LOW (ref 3.87–5.11)
RDW: 12.4 % (ref 11.5–15.5)
WBC: 13 10*3/uL — ABNORMAL HIGH (ref 4.0–10.5)
nRBC: 0 % (ref 0.0–0.2)

## 2020-12-02 LAB — BASIC METABOLIC PANEL
Anion gap: 6 (ref 5–15)
BUN: 12 mg/dL (ref 8–23)
CO2: 26 mmol/L (ref 22–32)
Calcium: 9 mg/dL (ref 8.9–10.3)
Chloride: 109 mmol/L (ref 98–111)
Creatinine, Ser: 0.77 mg/dL (ref 0.44–1.00)
GFR, Estimated: >60 mL/min (ref >60)
Glucose, Bld: 151 mg/dL — ABNORMAL HIGH (ref 70–99)
Potassium: 3.9 mmol/L (ref 3.5–5.1)
Sodium: 141 mmol/L (ref 135–145)

## 2020-12-02 NOTE — Progress Notes (Signed)
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    PT Comments    Progressing well with mobility however pain control is not ideal per pt. She rated pain 9/10 during session. Will plan to have a 2nd session to continue gait training.     Recommendations for follow up therapy are one component of a multi-disciplinary discharge planning process, led by the attending physician.  Recommendations may be updated based on patient status, additional functional criteria and insurance authorization.  Follow Up Recommendations  Follow surgeon's recommendation for DC plan and follow-up therapies (per chart, HHPT then OPPT)     Equipment Recommendations  Rolling walker with 5" wheels    Recommendations for Other Services       Precautions / Restrictions Precautions Precautions: Knee Restrictions Weight Bearing Restrictions: No Other Position/Activity Restrictions: WBAT     Mobility  Bed Mobility Overal bed mobility: Modified Independent                  Transfers Overall transfer level: Needs assistance Equipment used: Rolling walker (2 wheeled) Transfers: Sit to/from Stand Sit to Stand: Supervision         General transfer comment: Supv for safety. Increased time. Cues provided for technique.  Ambulation/Gait Ambulation/Gait assistance: Min guard Gait Distance (Feet): 50 Feet Assistive device: Rolling walker (2 wheeled) Gait Pattern/deviations: Step-to pattern;Antalgic     General Gait Details: Min guard for safety. Slow but steady gait. Distance limited by pain.   Stairs             Wheelchair Mobility    Modified Rankin (Stroke Patients Only)       Balance Overall balance assessment: Needs assistance         Standing balance support: Bilateral upper extremity  supported Standing balance-Leahy Scale: Fair                              Cognition Arousal/Alertness: Awake/alert Behavior During Therapy: WFL for tasks assessed/performed Overall Cognitive Status: Within Functional Limits for tasks assessed                                        Exercises Total Joint Exercises Ankle Circles/Pumps: AROM;Both;10 reps Quad Sets: AROM;Both;10 reps Hip ABduction/ADduction: AROM;Right;10 reps Straight Leg Raises: AROM;Right;10 reps Knee Flexion: AROM;Right;5 reps;Seated Goniometric ROM: ~10-65 degrees    General Comments        Pertinent Vitals/Pain Pain Assessment: 0-10 Pain Score: 9  Pain Location: R knee Pain Descriptors / Indicators: Discomfort;Sore Pain Intervention(s): Limited activity within patient's tolerance;Monitored during session;Ice applied;Repositioned    Home Living                      Prior Function            PT Goals (current goals can now be found in the care plan section) Progress towards PT goals: Progressing toward goals    Frequency    7X/week      PT Plan Current plan remains appropriate    Co-evaluation              AM-PAC PT "6 Clicks" Mobility   Outcome Measure  Help needed turning from your  back to your side while in a flat bed without using bedrails?: None Help needed moving from lying on your back to sitting on the side of a flat bed without using bedrails?: None Help needed moving to and from a bed to a chair (including a wheelchair)?: A Little Help needed standing up from a chair using your arms (e.g., wheelchair or bedside chair)?: A Little Help needed to walk in hospital room?: A Little Help needed climbing 3-5 steps with a railing? : A Little 6 Click Score: 20    End of Session Equipment Utilized During Treatment: Gait belt Activity Tolerance: Patient tolerated treatment well Patient left: in chair;with call Backes/phone within reach   PT  Visit Diagnosis: Other abnormalities of gait and mobility (R26.89);Pain Pain - Right/Left: Right Pain - part of body: Knee     Time: 1002-1020 PT Time Calculation (min) (ACUTE ONLY): 18 min  Charges:  $Gait Training: 8-22 mins                         Faye Ramsay, PT Acute Rehabilitation  Office: [phone] Pager: [phone]

## 2020-12-02 NOTE — Progress Notes (Signed)
Physical Therapy Treatment Patient Details Name: Morgan Reed MRN: [id number] DOB: Aug 12, 1945 Today's Date: 12/02/2020   History of Present Illness pt s/p R TKA revision from previous unicompartmental knee on 2013.    PT Comments    Pt is experiencing severe pain since this morning's session. She was barely able to walk to/from the bathroom this session. Instructed pt to keep RN updated-she stated her pain level had not decreased and she has received IV pain meds and pills prior to this session. Unfortunately, pt has not been able to meet her PT goals 2* poor pain control. Will continue to follow and progress activity as tolerated.     Recommendations for follow up therapy are one component of a multi-disciplinary discharge planning process, led by the attending physician.  Recommendations may be updated based on patient status, additional functional criteria and insurance authorization.  Follow Up Recommendations  Follow surgeon's recommendation for DC plan and follow-up therapies     Equipment Recommendations       Recommendations for Other Services       Precautions / Restrictions Precautions Precautions: Knee Restrictions Weight Bearing Restrictions: No Other Position/Activity Restrictions: WBAT     Mobility  Bed Mobility Overal bed mobility: Needs Assistance Bed Mobility: Supine to Sit;Sit to Supine     Supine to sit: Min assist;HOB elevated Sit to supine: Min assist;HOB elevated   General bed mobility comments: Assist for R LE. Increased time.    Transfers Overall transfer level: Needs assistance Equipment used: Rolling walker (2 wheeled) Transfers: Sit to/from Stand Sit to Stand: Min assist         General transfer comment: Assist to power up, steady, control descent. Cues for safety, technique, hand/LE placement.  Ambulation/Gait Ambulation/Gait assistance: Min assist Gait Distance (Feet): 15 Feet (x2) Assistive device: Rolling walker (2  wheeled) Gait Pattern/deviations: Step-to pattern;Antalgic;Decreased stance time - right     General Gait Details: Assist to steady intermittently. Gait distance limited by pain. Pt only able to tolerate ambulating to/from bathroom this afternoon   Stairs             Wheelchair Mobility    Modified Rankin (Stroke Patients Only)       Balance Overall balance assessment: Needs assistance         Standing balance support: Bilateral upper extremity supported Standing balance-Leahy Scale: Poor                              Cognition Arousal/Alertness: Awake/alert Behavior During Therapy: WFL for tasks assessed/performed Overall Cognitive Status: Within Functional Limits for tasks assessed                                        Exercises      General Comments        Pertinent Vitals/Pain Pain Assessment: 0-10 Pain Score: 10-Worst pain ever Pain Location: R knee Pain Descriptors / Indicators: Discomfort;Sore Pain Intervention(s): Limited activity within patient's tolerance;Monitored during session;Ice applied;Repositioned    Home Living                      Prior Function            PT Goals (current goals can now be found in the care plan section) Progress towards PT goals: Progressing toward goals    Frequency  7X/week      PT Plan Current plan remains appropriate    Co-evaluation              AM-PAC PT "6 Clicks" Mobility   Outcome Measure  Help needed turning from your back to your side while in a flat bed without using bedrails?: A Little Help needed moving from lying on your back to sitting on the side of a flat bed without using bedrails?: A Little Help needed moving to and from a bed to a chair (including a wheelchair)?: A Little Help needed standing up from a chair using your arms (e.g., wheelchair or bedside chair)?: A Little Help needed to walk in hospital room?: A Little Help needed  climbing 3-5 steps with a railing? : A Little 6 Click Score: 18    End of Session Equipment Utilized During Treatment: Gait belt Activity Tolerance: Patient limited by pain Patient left: in bed;with call Alberts/phone within reach;with family/visitor present   PT Visit Diagnosis: Other abnormalities of gait and mobility (R26.89);Pain Pain - Right/Left: Right Pain - part of body: Knee     Time: 1555-1611 PT Time Calculation (min) (ACUTE ONLY): 16 min  Charges:  $Gait Training: 8-22 mins                        Faye Ramsay, PT Acute Rehabilitation  Office: [phone] Pager: [phone]

## 2020-12-02 NOTE — Progress Notes (Signed)
Patient did not progress with PT, Arvid Right PA notified.  Sharrell Ku RN

## 2020-12-02 NOTE — Addendum Note (Signed)
Addendum  created 12/02/20 1007 by Heather Roberts, MD   Intraprocedure Staff edited

## 2020-12-02 NOTE — Progress Notes (Signed)
PATIENT ID: SEASON ASTACIO  MRN: [id number]  DOB/AGE:  [birth year] / 75 y.o.  1 Day Post-Op Procedure(s) (LRB): RIGHT TOTAL KNEE REVISION (Right)    PROGRESS NOTE Subjective: Patient is alert, oriented, no Nausea, no Vomiting, yes passing gas. Taking PO well. Denies SOB, Chest or Calf Pain. Using Incentive Spirometer, PAS in place. Ambulate 20", Patient reports pain as 3/10 .    Objective: Vital signs in last 24 hours: Vitals:   12/01/20 1822 12/01/20 2150 12/02/20 0216 12/02/20 0604  BP: (!) 124/100 136/89 (!) 142/75 127/66  Pulse: 100 87 78 79  Resp: 18 16 16 16   Temp: 98.7 F (37.1 C) 98.6 F (37 C) 98.1 F (36.7 C) 97.8 F (36.6 C)  TempSrc:  Oral Oral Oral  SpO2: 96% 99% 100% 100%  Weight:      Height:          Intake/Output from previous day: I/O last 3 completed shifts: In: 2903.7 [I.V.:2603.7; IV Piggyback:300] Out: 2985 [Urine:2885; Blood:100]   Intake/Output this shift: No intake/output data recorded.   LABORATORY DATA: Recent Labs    12/02/20 0337  WBC 13.0*  HGB 10.4*  HCT 31.4*  PLT 221  NA 141  K 3.9  CL 109  CO2 26  BUN 12  CREATININE 0.77  GLUCOSE 151*  CALCIUM 9.0    Examination: Neurologically intact ABD soft Neurovascular intact Sensation intact distally Intact pulses distally Dorsiflexion/Plantar flexion intact Incision: dressing C/D/I No cellulitis present Compartment soft}  Assessment:   1 Day Post-Op Procedure(s) (LRB): RIGHT TOTAL KNEE REVISION (Right) ADDITIONAL DIAGNOSIS: Expected Acute Blood Loss Anemia,  Anticipated LOS equal to or greater than 2 midnights due to - Age 74 and older with one or more of the following:  - Obesity  - Expected need for hospital services (PT, OT, Nursing) required for safe  discharge  - Anticipated need for postoperative skilled nursing care or inpatient rehab   Plan: PT/OT WBAT, AROM and PROM  DVT Prophylaxis:  SCDx72hrs, ASA 81 mg BID x 2 weeks DISCHARGE PLAN: Home, tocay if passes  PT DISCHARGE NEEDS: HHPT, Walker, and 3-in-1 comode seat     75 12/02/2020, 7:49 AM  Patient ID: 12/04/2020, female   DOB: 29-Apr-1945, 75 y.o.   MRN: [id number]

## 2020-12-02 NOTE — Plan of Care (Signed)
  Problem: Education: Goal: Knowledge of General Education information will improve Description Including pain rating scale, medication(s)/side effects and non-pharmacologic comfort measures Outcome: Progressing   Problem: Nutrition: Goal: Adequate nutrition will be maintained Outcome: Progressing   Problem: Pain Managment: Goal: General experience of comfort will improve Outcome: Progressing   

## 2020-12-02 NOTE — TOC Transition Note (Signed)
Transition of Care (TOC) - CM/SW Discharge Note   Patient Details  Name: Tamana M Redwine MRN: [id number] Date of Birth: 08/07/1945  Transition of Care (TOC) CM/SW Contact:  HOYLE, LUCY, LCSW Phone Number: 12/02/2020, 1:16 PM   Clinical Narrative:     Met with pt and confirming receipt of youth rw per [employer].  Plan for HHPT prearranged by MD office with [facility].  No further TOC needs.  Final next level of care: Home w Home Health Services Barriers to Discharge: No Barriers Identified   Patient Goals and CMS Choice Patient states their goals for this hospitalization and ongoing recovery are:: return home      Discharge Placement                       Discharge Plan and Services                DME Arranged: Walker youth DME Agency: [employer]       HH Arranged: PT HH Agency: [facility]        Social Determinants of Health (SDOH) Interventions     Readmission Risk Interventions No flowsheet data found.     

## 2020-12-03 ENCOUNTER — Other Ambulatory Visit (HOSPITAL_COMMUNITY): Payer: Self-pay

## 2020-12-03 LAB — CBC
HCT: 30.6 % — ABNORMAL LOW (ref 36.0–46.0)
Hemoglobin: 10 g/dL — ABNORMAL LOW (ref 12.0–15.0)
MCH: 30.2 pg (ref 26.0–34.0)
MCHC: 32.7 g/dL (ref 30.0–36.0)
MCV: 92.4 fL (ref 80.0–100.0)
Platelets: 213 10*3/uL (ref 150–400)
RBC: 3.31 MIL/uL — ABNORMAL LOW (ref 3.87–5.11)
RDW: 12.6 % (ref 11.5–15.5)
WBC: 9.3 10*3/uL (ref 4.0–10.5)
nRBC: 0 % (ref 0.0–0.2)

## 2020-12-03 MED ORDER — CELECOXIB 200 MG PO CAPS
200.0000 mg | ORAL_CAPSULE | Freq: Two times a day (BID) | ORAL | 2 refills | Status: AC
  Filled 2020-12-03: qty 60, 30d supply, fill #0

## 2020-12-03 NOTE — Progress Notes (Signed)
PATIENT ID: Morgan Reed  MRN: [id number]  DOB/AGE:  74/07/1945 / 75 y.o.  2 Days Post-Op Procedure(s) (LRB): RIGHT TOTAL KNEE REVISION (Right)    PROGRESS NOTE Subjective: Patient is alert, oriented, no Nausea, no Vomiting, yes passing gas. Taking PO well. Denies SOB, Chest or Calf Pain. Using Incentive Spirometer, PAS in place. Ambulate WBAT with pt walking 15 ft x2 , Patient reports pain as moderate to severe.    Objective: Vital signs in last 24 hours: Vitals:   12/02/20 1026 12/02/20 1408 12/02/20 2100 12/03/20 0527  BP: 106/69 118/62 135/64 (!) 147/67  Pulse: 82 84 82 95  Resp: 18 18 18 17   Temp:  98.3 F (36.8 C) 98.9 F (37.2 C) 98.8 F (37.1 C)  TempSrc:  Oral Oral   SpO2: 98% 99% 99% 98%  Weight:      Height:          Intake/Output from previous day: I/O last 3 completed shifts: In: 1731.5 [P.O.:180; I.V.:1551.5] Out: 2120 [Urine:2120]   Intake/Output this shift: No intake/output data recorded.   LABORATORY DATA: Recent Labs    12/02/20 0337 12/03/20 0333  WBC 13.0* 9.3  HGB 10.4* 10.0*  HCT 31.4* 30.6*  PLT 221 213  NA 141  --   K 3.9  --   CL 109  --   CO2 26  --   BUN 12  --   CREATININE 0.77  --   GLUCOSE 151*  --   CALCIUM 9.0  --     Examination: Neurologically intact ABD soft Neurovascular intact Sensation intact distally Intact pulses distally Dorsiflexion/Plantar flexion intact Incision: dressing C/D/I and scant drainage No cellulitis present Compartment soft}  Assessment:   2 Days Post-Op Procedure(s) (LRB): RIGHT TOTAL KNEE REVISION (Right) ADDITIONAL DIAGNOSIS: Expected Acute Blood Loss Anemia,  Anticipated LOS equal to or greater than 2 midnights due to - Age 74 and older with one or more of the following:  - Obesity  - Expected need for hospital services (PT, OT, Nursing) required for safe  discharge  - Anticipated need for postoperative skilled nursing care or inpatient rehab  OR   - Unanticipated findings  during/Post Surgery: Slow post-op progression: GI, pain control, mobility  -    Plan: PT/OT WBAT, AROM and PROM  DVT Prophylaxis:  SCDx72hrs, ASA 81 mg BID x 2 weeks DISCHARGE PLAN: Home DISCHARGE NEEDS: HHPT, Walker, and 3-in-1 comode seat     75 12/03/2020, 8:11 AM

## 2020-12-03 NOTE — Plan of Care (Signed)
Discharge instructions given to pt and her daughter

## 2020-12-03 NOTE — Discharge Summary (Signed)
Patient ID: Morgan Reed MRN: [id number] DOB/AGE: 1945/10/30 75 y.o.  Admit date: 12/01/2020 Discharge date: 12/03/2020  Admission Diagnoses:  Active Problems:   S/P revision of total knee, right   Discharge Diagnoses:  Same  Past Medical History:  Diagnosis Date   Arthritis    knees, hands    Surgeries: Procedure(s): RIGHT TOTAL KNEE REVISION on 12/01/2020   Consultants:   Discharged Condition: Improved  Hospital Course: Morgan Reed is an 75 y.o. female who was admitted 12/01/2020 for operative treatment of<principal problem not specified>. Patient has severe unremitting pain that affects sleep, daily activities, and work/hobbies. After pre-op clearance the patient was taken to the operating room on 12/01/2020 and underwent  Procedure(s): RIGHT TOTAL KNEE REVISION.    Patient was given perioperative antibiotics:  Anti-infectives (From admission, onward)    Start     Dose/Rate Route Frequency Ordered Stop   12/01/20 0800  ceFAZolin (ANCEF) IVPB 2g/100 mL premix        2 g 200 mL/hr over 30 Minutes Intravenous On call to O.R. 12/01/20 0932 12/01/20 1107        Patient was given sequential compression devices, early ambulation, and chemoprophylaxis to prevent DVT.  Patient benefited maximally from hospital stay and there were no complications.    Recent vital signs: Patient Vitals for the past 24 hrs:  BP Temp Temp src Pulse Resp SpO2  12/03/20 0527 (!) 147/67 98.8 F (37.1 C) -- 95 17 98 %  12/02/20 2100 135/64 98.9 F (37.2 C) Oral 82 18 99 %  12/02/20 1408 118/62 98.3 F (36.8 C) Oral 84 18 99 %  12/02/20 1026 106/69 -- -- 82 18 98 %     Recent laboratory studies:  Recent Labs    12/02/20 0337 12/03/20 0333  WBC 13.0* 9.3  HGB 10.4* 10.0*  HCT 31.4* 30.6*  PLT 221 213  NA 141  --   K 3.9  --   CL 109  --   CO2 26  --   BUN 12  --   CREATININE 0.77  --   GLUCOSE 151*  --   CALCIUM 9.0  --      Discharge Medications:   Allergies as of  12/03/2020   No Known Allergies      Medication List     STOP taking these medications    diclofenac 75 MG EC tablet Commonly known as: VOLTAREN   diclofenac Sodium 1 % Gel Commonly known as: VOLTAREN       TAKE these medications    acidophilus Caps capsule Take 1 capsule by mouth daily.   aspirin EC 81 MG tablet Take 1 tablet (81 mg total) by mouth 2 (two) times daily.   atorvastatin 40 MG tablet Commonly known as: LIPITOR Take 40 mg by mouth daily.   buPROPion 300 MG 24 hr tablet Commonly known as: WELLBUTRIN XL Take 300 mg by mouth every morning.   CALCIUM-VITAMIN D PO Take 1 tablet by mouth daily.   celecoxib 200 MG capsule Commonly known as: CeleBREX Take 1 capsule (200 mg total) by mouth 2 (two) times daily.   escitalopram 5 MG tablet Commonly known as: LEXAPRO Take 5 mg by mouth daily.   multivitamin with minerals Tabs tablet Take 1 tablet by mouth daily.   oxyCODONE-acetaminophen 5-325 MG tablet Commonly known as: PERCOCET/ROXICET Take 1 tablet by mouth every 4 (four) hours as needed for severe pain.   SYSTANE OP Place 1 drop into both eyes 2 (  two) times daily as needed (dry eyes).   tiZANidine 2 MG tablet Commonly known as: ZANAFLEX Take 1 tablet (2 mg total) by mouth every 6 (six) hours as needed.   traMADol 50 MG tablet Commonly known as: ULTRAM Take 50 mg by mouth 2 (two) times daily as needed for pain.   traZODone 50 MG tablet Commonly known as: DESYREL Take 50 mg by mouth at bedtime.               Durable Medical Equipment  (From admission, onward)           Start     Ordered   12/01/20 1517  DME Walker rolling  Once       Question:  Patient needs a walker to treat with the following condition  Answer:  Status post right knee replacement   12/01/20 1516   12/01/20 1517  DME 3 n 1  Once        12/01/20 1516              Discharge Care Instructions  (From admission, onward)           Start     Ordered    12/03/20 0000  Weight bearing as tolerated        12/03/20 0815   12/02/20 0000  Change dressing       Comments: Change dressing Only if drainage exceeds 40% of window on dressing   12/02/20 0754            Diagnostic Studies: No results found.  Disposition: Discharge disposition: 01-Home or Self Care       Discharge Instructions     Call MD / Call 911   Complete by: As directed    If you experience chest pain or shortness of breath, CALL 911 and be transported to the hospital emergency room.  If you develope a fever above 101 F, pus (white drainage) or increased drainage or redness at the wound, or calf pain, call your surgeon's office.   Call MD / Call 911   Complete by: As directed    If you experience chest pain or shortness of breath, CALL 911 and be transported to the hospital emergency room.  If you develope a fever above 101 F, pus (white drainage) or increased drainage or redness at the wound, or calf pain, call your surgeon's office.   Change dressing   Complete by: As directed    Change dressing Only if drainage exceeds 40% of window on dressing   Constipation Prevention   Complete by: As directed    Drink plenty of fluids.  Prune juice may be helpful.  You may use a stool softener, such as Colace (over the counter) 100 mg twice a day.  Use MiraLax (over the counter) for constipation as needed.   Constipation Prevention   Complete by: As directed    Drink plenty of fluids.  Prune juice may be helpful.  You may use a stool softener, such as Colace (over the counter) 100 mg twice a day.  Use MiraLax (over the counter) for constipation as needed.   Diet - low sodium heart healthy   Complete by: As directed    Driving restrictions   Complete by: As directed    No driving for 2 weeks   Increase activity slowly as tolerated   Complete by: As directed    Increase activity slowly as tolerated   Complete by: As directed    Patient  may shower   Complete by: As  directed    You may shower without a dressing once there is no drainage.  Do not wash over the wound.  If drainage remains, cover wound with plastic wrap and then shower.   Post-operative opioid taper instructions:   Complete by: As directed    POST-OPERATIVE OPIOID TAPER INSTRUCTIONS: It is important to wean off of your opioid medication as soon as possible. If you do not need pain medication after your surgery it is ok to stop day one. Opioids include: Codeine, Hydrocodone(Norco, Vicodin), Oxycodone(Percocet, oxycontin) and hydromorphone amongst others.  Long term and even short term use of opiods can cause: Increased pain response Dependence Constipation Depression Respiratory depression And more.  Withdrawal symptoms can include Flu like symptoms Nausea, vomiting And more Techniques to manage these symptoms Hydrate well Eat regular healthy meals Stay active Use relaxation techniques(deep breathing, meditating, yoga) Do Not substitute Alcohol to help with tapering If you have been on opioids for less than two weeks and do not have pain than it is ok to stop all together.  Plan to wean off of opioids This plan should start within one week post op of your joint replacement. Maintain the same interval or time between taking each dose and first decrease the dose.  Cut the total daily intake of opioids by one tablet each day Next start to increase the time between doses. The last dose that should be eliminated is the evening dose.      Post-operative opioid taper instructions:   Complete by: As directed    POST-OPERATIVE OPIOID TAPER INSTRUCTIONS: It is important to wean off of your opioid medication as soon as possible. If you do not need pain medication after your surgery it is ok to stop day one. Opioids include: Codeine, Hydrocodone(Norco, Vicodin), Oxycodone(Percocet, oxycontin) and hydromorphone amongst others.  Long term and even short term use of opiods can  cause: Increased pain response Dependence Constipation Depression Respiratory depression And more.  Withdrawal symptoms can include Flu like symptoms Nausea, vomiting And more Techniques to manage these symptoms Hydrate well Eat regular healthy meals Stay active Use relaxation techniques(deep breathing, meditating, yoga) Do Not substitute Alcohol to help with tapering If you have been on opioids for less than two weeks and do not have pain than it is ok to stop all together.  Plan to wean off of opioids This plan should start within one week post op of your joint replacement. Maintain the same interval or time between taking each dose and first decrease the dose.  Cut the total daily intake of opioids by one tablet each day Next start to increase the time between doses. The last dose that should be eliminated is the evening dose.      Weight bearing as tolerated   Complete by: As directed         Follow-up Information     Gean Birchwood, MD Follow up in 2 week(s).   Specialty: Orthopedic Surgery Contact information: [address] [place] [address] [phone]         [facility] Follow up.   Specialty: Home Health Services Why: providing home health physical therapy Contact information: [address] [place] [address] [phone]                  Signed: Dannielle Burn 12/03/2020, 8:15 AM

## 2020-12-03 NOTE — Progress Notes (Signed)
Physical Therapy Treatment Patient Details Name: Morgan Reed MRN: [id number] DOB: 01/04/1946 Today's Date: 12/03/2020   History of Present Illness pt s/p R TKA revision from previous unicompartmental knee on 2013.    PT Comments    Progressing with mobility. Improved pain control on today compared to yesterday afternoon. Will plan to have a 2nd session prior to d/c home later today.     Recommendations for follow up therapy are one component of a multi-disciplinary discharge planning process, led by the attending physician.  Recommendations may be updated based on patient status, additional functional criteria and insurance authorization.  Follow Up Recommendations  Follow surgeon's recommendation for DC plan and follow-up therapies     Equipment Recommendations       Recommendations for Other Services       Precautions / Restrictions Precautions Precautions: Knee;Fall Restrictions Weight Bearing Restrictions: No Other Position/Activity Restrictions: WBAT     Mobility  Bed Mobility Overal bed mobility: Needs Assistance Bed Mobility: Supine to Sit;Sit to Supine     Supine to sit: Min guard Sit to supine: Min assist   General bed mobility comments: Assist for R LE. Increased time.    Transfers Overall transfer level: Needs assistance Equipment used: Rolling walker (2 wheeled) Transfers: Sit to/from Stand Sit to Stand: Min guard         General transfer comment: Min guard for safety. Cues for safety, hand placement  Ambulation/Gait Ambulation/Gait assistance: Min guard Gait Distance (Feet): 75 Feet Assistive device: Rolling walker (2 wheeled) Gait Pattern/deviations: Step-to pattern;Antalgic     General Gait Details: Min guard for safety. Pt tolerated distance well.   Stairs             Wheelchair Mobility    Modified Rankin (Stroke Patients Only)       Balance Overall balance assessment: Needs assistance         Standing balance  support: Bilateral upper extremity supported Standing balance-Leahy Scale: Fair                              Cognition Arousal/Alertness: Awake/alert Behavior During Therapy: WFL for tasks assessed/performed Overall Cognitive Status: Within Functional Limits for tasks assessed                                        Exercises Total Joint Exercises Ankle Circles/Pumps: AROM;Both;10 reps Quad Sets: AROM;Right;10 reps Hip ABduction/ADduction: AROM;AAROM;Right;10 reps Straight Leg Raises: AAROM;Strengthening;10 reps Goniometric ROM: ~10-65 degrees    General Comments        Pertinent Vitals/Pain Pain Assessment: 0-10 Pain Score: 8  Pain Location: R knee with activity; 5/10 at rest Pain Descriptors / Indicators: Discomfort;Sore Pain Intervention(s): Monitored during session;Ice applied;RN gave pain meds during session    Home Living                      Prior Function            PT Goals (current goals can now be found in the care plan section) Progress towards PT goals: Progressing toward goals    Frequency    7X/week      PT Plan Current plan remains appropriate    Co-evaluation              AM-PAC PT "6 Clicks" Mobility   Outcome Measure  Help needed turning from your back to your side while in a flat bed without using bedrails?: A Little Help needed moving from lying on your back to sitting on the side of a flat bed without using bedrails?: A Little Help needed moving to and from a bed to a chair (including a wheelchair)?: A Little Help needed standing up from a chair using your arms (e.g., wheelchair or bedside chair)?: A Little Help needed to walk in hospital room?: A Little Help needed climbing 3-5 steps with a railing? : A Little 6 Click Score: 18    End of Session Equipment Utilized During Treatment: Gait belt Activity Tolerance: Patient tolerated treatment well Patient left: in bed;with call [place]/phone  within reach   PT Visit Diagnosis: Other abnormalities of gait and mobility (R26.89);Pain Pain - Right/Left: Right Pain - part of body: Knee     Time: 1779-3903 PT Time Calculation (min) (ACUTE ONLY): 24 min  Charges:  $Gait Training: 8-22 mins $Therapeutic Exercise: 8-22 mins                         Faye Ramsay, PT Acute Rehabilitation  Office: [phone] Pager: [phone]

## 2020-12-03 NOTE — Addendum Note (Signed)
Addendum  created 12/03/20 0730 by Eilene Ghazi, MD   Attestation recorded in Intraprocedure, Intraprocedure Attestations filed

## 2020-12-03 NOTE — Progress Notes (Signed)
Physical Therapy Treatment Patient Details Name: Morgan Reed MRN: [id number] DOB: 02-Sep-1945 Today's Date: 12/03/2020   History of Present Illness pt s/p R TKA revision from previous unicompartmental knee on 2013.    PT Comments    Progressing with mobility. Moderate pain with activity. Okay to d/c from PT standpoint   Recommendations for follow up therapy are one component of a multi-disciplinary discharge planning process, led by the attending physician.  Recommendations may be updated based on patient status, additional functional criteria and insurance authorization.  Follow Up Recommendations  Follow surgeon's recommendation for DC plan and follow-up therapies     Equipment Recommendations       Recommendations for Other Services       Precautions / Restrictions Precautions Precautions: Knee;Fall Restrictions Weight Bearing Restrictions: No Other Position/Activity Restrictions: WBAT     Mobility  Bed Mobility Overal bed mobility: Needs Assistance Bed Mobility: Supine to Sit;Sit to Supine     Supine to sit: Min guard Sit to supine: Min assist   General bed mobility comments: Assist for R LE. Increased time.    Transfers Overall transfer level: Needs assistance Equipment used: Rolling walker (2 wheeled) Transfers: Sit to/from Stand Sit to Stand: Min guard         General transfer comment: Min guard for safety. Cues for safety, hand placement  Ambulation/Gait Ambulation/Gait assistance: Min guard Gait Distance (Feet): 75 Feet Assistive device: Rolling walker (2 wheeled) Gait Pattern/deviations: Step-to pattern;Antalgic     General Gait Details: Min guard for safety. Pt tolerated distance well.   Stairs             Wheelchair Mobility    Modified Rankin (Stroke Patients Only)       Balance                                            Cognition Arousal/Alertness: Awake/alert Behavior During Therapy: WFL for tasks  assessed/performed Overall Cognitive Status: Within Functional Limits for tasks assessed                                        Exercises      General Comments        Pertinent Vitals/Pain Pain Assessment: 0-10 Pain Score: 8  Pain Location: R knee with activity; 5/10 at rest Pain Descriptors / Indicators: Discomfort;Sore Pain Intervention(s): Monitored during session;Repositioned    Home Living                      Prior Function            PT Goals (current goals can now be found in the care plan section) Progress towards PT goals: Progressing toward goals    Frequency    7X/week      PT Plan Current plan remains appropriate    Co-evaluation              AM-PAC PT "6 Clicks" Mobility   Outcome Measure  Help needed turning from your back to your side while in a flat bed without using bedrails?: A Little Help needed moving from lying on your back to sitting on the side of a flat bed without using bedrails?: A Little Help needed moving to and from a bed to a  chair (including a wheelchair)?: A Little Help needed standing up from a chair using your arms (e.g., wheelchair or bedside chair)?: A Little Help needed to walk in hospital room?: A Little Help needed climbing 3-5 steps with a railing? : A Little 6 Click Score: 18    End of Session Equipment Utilized During Treatment: Gait belt Activity Tolerance: Patient tolerated treatment well Patient left: in bed;with call [place]/phone within reach   PT Visit Diagnosis: Other abnormalities of gait and mobility (R26.89);Pain Pain - Right/Left: Right Pain - part of body: Knee     Time: 1443-1455 PT Time Calculation (min) (ACUTE ONLY): 12 min  Charges:  $Gait Training: 8-22 mins                         Faye Ramsay, PT Acute Rehabilitation  Office: [phone] Pager: [phone]

## 2020-12-05 DIAGNOSIS — G4700 Insomnia, unspecified: Secondary | ICD-10-CM | POA: Diagnosis not present

## 2020-12-05 DIAGNOSIS — Z96651 Presence of right artificial knee joint: Secondary | ICD-10-CM | POA: Diagnosis not present

## 2020-12-05 DIAGNOSIS — L930 Discoid lupus erythematosus: Secondary | ICD-10-CM | POA: Diagnosis not present

## 2020-12-05 DIAGNOSIS — T8484XD Pain due to internal orthopedic prosthetic devices, implants and grafts, subsequent encounter: Secondary | ICD-10-CM | POA: Diagnosis not present

## 2020-12-05 DIAGNOSIS — Z7982 Long term (current) use of aspirin: Secondary | ICD-10-CM | POA: Diagnosis not present

## 2020-12-05 DIAGNOSIS — E669 Obesity, unspecified: Secondary | ICD-10-CM | POA: Diagnosis not present

## 2020-12-05 DIAGNOSIS — M8580 Other specified disorders of bone density and structure, unspecified site: Secondary | ICD-10-CM | POA: Diagnosis not present

## 2020-12-05 DIAGNOSIS — Z87891 Personal history of nicotine dependence: Secondary | ICD-10-CM | POA: Diagnosis not present

## 2020-12-05 DIAGNOSIS — M1712 Unilateral primary osteoarthritis, left knee: Secondary | ICD-10-CM | POA: Diagnosis not present

## 2020-12-05 DIAGNOSIS — M19041 Primary osteoarthritis, right hand: Secondary | ICD-10-CM | POA: Diagnosis not present

## 2020-12-05 DIAGNOSIS — M19042 Primary osteoarthritis, left hand: Secondary | ICD-10-CM | POA: Diagnosis not present

## 2020-12-05 DIAGNOSIS — Z6832 Body mass index (BMI) 32.0-32.9, adult: Secondary | ICD-10-CM | POA: Diagnosis not present

## 2020-12-05 DIAGNOSIS — E785 Hyperlipidemia, unspecified: Secondary | ICD-10-CM | POA: Diagnosis not present

## 2020-12-09 ENCOUNTER — Other Ambulatory Visit (HOSPITAL_COMMUNITY): Payer: Self-pay

## 2020-12-09 DIAGNOSIS — M19041 Primary osteoarthritis, right hand: Secondary | ICD-10-CM | POA: Diagnosis not present

## 2020-12-09 DIAGNOSIS — M8580 Other specified disorders of bone density and structure, unspecified site: Secondary | ICD-10-CM | POA: Diagnosis not present

## 2020-12-09 DIAGNOSIS — T8484XD Pain due to internal orthopedic prosthetic devices, implants and grafts, subsequent encounter: Secondary | ICD-10-CM | POA: Diagnosis not present

## 2020-12-09 DIAGNOSIS — G4700 Insomnia, unspecified: Secondary | ICD-10-CM | POA: Diagnosis not present

## 2020-12-09 DIAGNOSIS — M1712 Unilateral primary osteoarthritis, left knee: Secondary | ICD-10-CM | POA: Diagnosis not present

## 2020-12-09 DIAGNOSIS — M19042 Primary osteoarthritis, left hand: Secondary | ICD-10-CM | POA: Diagnosis not present

## 2020-12-11 DIAGNOSIS — G4700 Insomnia, unspecified: Secondary | ICD-10-CM | POA: Diagnosis not present

## 2020-12-11 DIAGNOSIS — M19042 Primary osteoarthritis, left hand: Secondary | ICD-10-CM | POA: Diagnosis not present

## 2020-12-11 DIAGNOSIS — M19041 Primary osteoarthritis, right hand: Secondary | ICD-10-CM | POA: Diagnosis not present

## 2020-12-11 DIAGNOSIS — M8580 Other specified disorders of bone density and structure, unspecified site: Secondary | ICD-10-CM | POA: Diagnosis not present

## 2020-12-11 DIAGNOSIS — T8484XD Pain due to internal orthopedic prosthetic devices, implants and grafts, subsequent encounter: Secondary | ICD-10-CM | POA: Diagnosis not present

## 2020-12-11 DIAGNOSIS — M1712 Unilateral primary osteoarthritis, left knee: Secondary | ICD-10-CM | POA: Diagnosis not present

## 2020-12-12 DIAGNOSIS — M19041 Primary osteoarthritis, right hand: Secondary | ICD-10-CM | POA: Diagnosis not present

## 2020-12-12 DIAGNOSIS — M1711 Unilateral primary osteoarthritis, right knee: Secondary | ICD-10-CM | POA: Diagnosis not present

## 2020-12-12 DIAGNOSIS — M1712 Unilateral primary osteoarthritis, left knee: Secondary | ICD-10-CM | POA: Diagnosis not present

## 2020-12-12 DIAGNOSIS — M19042 Primary osteoarthritis, left hand: Secondary | ICD-10-CM | POA: Diagnosis not present

## 2020-12-12 DIAGNOSIS — G4700 Insomnia, unspecified: Secondary | ICD-10-CM | POA: Diagnosis not present

## 2020-12-12 DIAGNOSIS — M8580 Other specified disorders of bone density and structure, unspecified site: Secondary | ICD-10-CM | POA: Diagnosis not present

## 2020-12-12 DIAGNOSIS — T8484XD Pain due to internal orthopedic prosthetic devices, implants and grafts, subsequent encounter: Secondary | ICD-10-CM | POA: Diagnosis not present

## 2020-12-15 DIAGNOSIS — M19041 Primary osteoarthritis, right hand: Secondary | ICD-10-CM | POA: Diagnosis not present

## 2020-12-15 DIAGNOSIS — M19042 Primary osteoarthritis, left hand: Secondary | ICD-10-CM | POA: Diagnosis not present

## 2020-12-15 DIAGNOSIS — M1712 Unilateral primary osteoarthritis, left knee: Secondary | ICD-10-CM | POA: Diagnosis not present

## 2020-12-15 DIAGNOSIS — M8580 Other specified disorders of bone density and structure, unspecified site: Secondary | ICD-10-CM | POA: Diagnosis not present

## 2020-12-15 DIAGNOSIS — G4700 Insomnia, unspecified: Secondary | ICD-10-CM | POA: Diagnosis not present

## 2020-12-15 DIAGNOSIS — T8484XD Pain due to internal orthopedic prosthetic devices, implants and grafts, subsequent encounter: Secondary | ICD-10-CM | POA: Diagnosis not present

## 2020-12-16 DIAGNOSIS — Z96651 Presence of right artificial knee joint: Secondary | ICD-10-CM | POA: Diagnosis not present

## 2020-12-16 DIAGNOSIS — M25661 Stiffness of right knee, not elsewhere classified: Secondary | ICD-10-CM | POA: Diagnosis not present

## 2020-12-16 DIAGNOSIS — M6281 Muscle weakness (generalized): Secondary | ICD-10-CM | POA: Diagnosis not present

## 2020-12-18 DIAGNOSIS — M25661 Stiffness of right knee, not elsewhere classified: Secondary | ICD-10-CM | POA: Diagnosis not present

## 2020-12-18 DIAGNOSIS — M6281 Muscle weakness (generalized): Secondary | ICD-10-CM | POA: Diagnosis not present

## 2020-12-18 DIAGNOSIS — Z96651 Presence of right artificial knee joint: Secondary | ICD-10-CM | POA: Diagnosis not present

## 2020-12-23 DIAGNOSIS — M6281 Muscle weakness (generalized): Secondary | ICD-10-CM | POA: Diagnosis not present

## 2020-12-23 DIAGNOSIS — M25661 Stiffness of right knee, not elsewhere classified: Secondary | ICD-10-CM | POA: Diagnosis not present

## 2020-12-23 DIAGNOSIS — Z96651 Presence of right artificial knee joint: Secondary | ICD-10-CM | POA: Diagnosis not present

## 2020-12-24 ENCOUNTER — Other Ambulatory Visit (HOSPITAL_COMMUNITY): Payer: Self-pay

## 2020-12-25 DIAGNOSIS — M25661 Stiffness of right knee, not elsewhere classified: Secondary | ICD-10-CM | POA: Diagnosis not present

## 2020-12-25 DIAGNOSIS — M6281 Muscle weakness (generalized): Secondary | ICD-10-CM | POA: Diagnosis not present

## 2020-12-25 DIAGNOSIS — Z96651 Presence of right artificial knee joint: Secondary | ICD-10-CM | POA: Diagnosis not present

## 2020-12-25 NOTE — Progress Notes (Signed)
Office Visit Note  Patient: Morgan Reed             Date of Birth: 07-May-1945           MRN: [id number]             PCP: Wilfrid Lund, PA Referring: Artis Delay, MD Visit Date: 01/07/2021 Occupation: @GUAROCC @  Subjective:  Positive lupus anticoagulant.   History of Present Illness: Morgan Reed is a 75 y.o. female with a history of positive lupus anticoagulant.  She has been referred to me by Dr. 67.  According the patient she has had history of arthritis in her knee joints for many years.  She moved to [place] from [place] 5 years ago.  She had seen 3 different orthopedic surgeons due to ongoing pain and discomfort in her right knee which was replaced 10 years ago.  She decided to undergo right total knee replacement revision in September 2022 by Dr. October 2022.  She states she had very good recovery from the surgery.  She sprained her right ankle joint 2 weeks ago and now she is in a boot.  She has been getting left knee joint physical supplement injections.  She has some discomfort in her left shoulder joint off and on.  None of the other joints are painful.  During the preoperative work-up she was found to have prolonged PTT and lupus anticoagulant.  She was referred to Dr. Turner Daniels who repeated lupus anticoagulant and it was positive.  As she had no clinical features of antiphospholipid syndrome no treatment was advised.  She was placed on aspirin after the surgery by Dr. Bertis Ruddy.  She has been taking aspirin 81 mg twice a day.  She gives history of fatigue.  There is no history of oral ulcers, nasal ulcers, malar rash, photosensitivity, Raynaud's phenomenon, hair loss or lymphadenopathy.  She has dry mouth and dry eyes which she relates to medication use.  There is no family history of autoimmune disease.  She states she has been under a lot of stress as she lost her husband, brother and son-in-law in the last few years.  Activities of Daily Living:  Patient reports morning stiffness  for all day. Patient Denies nocturnal pain.  Difficulty dressing/grooming: Denies Difficulty climbing stairs: Reports Difficulty getting out of chair: Reports Difficulty using hands for taps, buttons, cutlery, and/or writing: Denies  Review of Systems  Constitutional:  Positive for fatigue.  HENT:  Positive for mouth dryness. Negative for mouth sores and nose dryness.   Eyes:  Positive for dryness. Negative for pain and itching.  Respiratory:  Negative for shortness of breath and difficulty breathing.   Cardiovascular:  Negative for chest pain and palpitations.  Gastrointestinal:  Negative for blood in stool, constipation and diarrhea.  Endocrine: Negative for increased urination.  Genitourinary:  Negative for difficulty urinating.  Musculoskeletal:  Positive for joint pain, joint pain, joint swelling, myalgias, morning stiffness, muscle tenderness and myalgias.  Skin:  Negative for color change, rash, redness and sensitivity to sunlight.  Allergic/Immunologic: Negative for susceptible to infections.  Neurological:  Positive for weakness. Negative for dizziness, numbness, headaches and memory loss.  Hematological:  Negative for bruising/bleeding tendency and swollen glands.  Psychiatric/Behavioral:  Positive for depressed mood. Negative for confusion and sleep disturbance. The patient is not nervous/anxious.        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) 10/31/2020    Past Medical History:  Diagnosis Date   Arthritis    knees, hands    Family History  Problem Relation Age of Onset   Cancer Mother         leukemia   Cancer Brother        liver cancer   Healthy Son    Healthy Daughter    Past Surgical History:  Procedure Laterality Date   ABDOMINAL HYSTERECTOMY  1995   complication with bowel tear   BREAST BIOPSY     right breast benign approx 1990   BREAST EXCISIONAL BIOPSY Right    right breast benign 1990   BUNIONECTOMY     EYE SURGERY Bilateral 2014   REPLACEMENT TOTAL KNEE Right 2013   TONSILLECTOMY     as a child   TOTAL KNEE REVISION Right 12/01/2020   Procedure: RIGHT TOTAL KNEE REVISION;  Surgeon: Gean Birchwood, MD;  Location: [facility];  Service: Orthopedics;  Laterality: Right;   Social History   Social History Narrative   Not on file   Immunization History  Administered Date(s) Administered   PFIZER(Purple Top)SARS-COV-2 Vaccination 04/13/2019, 05/08/2019     Objective: Vital Signs: BP 121/85 (BP Location: Right Arm, Patient Position: Sitting, Cuff Size: Normal)   Pulse 99   Ht 4' 9.5" (1.461 m)   Wt 158 lb 6.4 oz (71.8 kg)   BMI 33.68 kg/m    Physical Exam Vitals and nursing note reviewed.  Constitutional:      Appearance: She is well-developed.  HENT:     Head: Normocephalic and atraumatic.  Eyes:     Conjunctiva/sclera: Conjunctivae normal.  Cardiovascular:     Rate and Rhythm: Normal rate and regular rhythm.     Heart sounds: Normal heart sounds.  Pulmonary:     Effort: Pulmonary effort is normal.     Breath sounds: Normal breath sounds.  Abdominal:     General: Bowel sounds are normal.     Palpations: Abdomen is soft.  Musculoskeletal:     Cervical back: Normal range of motion.  Lymphadenopathy:     Cervical: No cervical adenopathy.  Skin:    General: Skin is warm and dry.     Capillary Refill: Capillary refill takes less than 2 seconds.  Neurological:     Mental Status: She is alert and oriented to person, place, and time.  Psychiatric:        Behavior: Behavior normal.     Musculoskeletal Exam: C-spine was in good range of motion.   She had thoracic kyphosis.  Shoulder joints, elbow joints, wrist joints, MCPs PIPs and DIPs with good range of motion with no synovitis.  Right knee joint is replaced with surgical scar and some warmth on palpation.  Left knee joint was in full range of motion.  Her right foot was in a boot due to recent sprain.  There was no tenderness over left ankle or MTPs.  CDAI Exam: CDAI Score: -- Patient Global: --; Provider Global: -- Swollen: --; Tender: -- Joint Exam 01/07/2021   No joint exam has been documented for this visit   There is currently no information documented on the homunculus. Go to the Rheumatology activity and complete the homunculus joint exam.  Investigation: No additional findings.  Imaging: No results found.  Recent Labs: Lab Results  Component Value Date   WBC 9.3 12/03/2020   HGB 10.0 (L) 12/03/2020   PLT 213 12/03/2020   NA 141 12/02/2020   K 3.9 12/02/2020   CL 109 12/02/2020   CO2 26 12/02/2020   GLUCOSE 151 (H) 12/02/2020   BUN 12 12/02/2020   CREATININE 0.77 12/02/2020   BILITOT 0.6 10/31/2020   ALKPHOS 84 10/31/2020   AST 29 10/31/2020   ALT 54 (H) 10/31/2020   PROT 6.7 10/31/2020   ALBUMIN 4.0 10/31/2020   CALCIUM 9.0 12/02/2020    Speciality Comments: No specialty comments available.  Procedures:  No procedures performed Allergies: Patient has no known allergies.   Assessment / Plan:     Visit Diagnoses: Lupus anticoagulant positive - 11/11/20: LA detected. 10/31/20: aPTT 46 -patient had lupus anticoagulant checked twice which was positive.  She was also evaluated by hematology.  She has been on aspirin 81 mg twice daily since her knee joint replacement.  There is no history of blood clots.  She has never had symptoms of antiphospholipid syndrome.  She was advised to continue aspirin and to stay active to prevent blood clots.  There is no history of oral ulcers, nasal ulcers, malar rash, photosensitivity, Raynaud's phenomenon or lymphadenopathy.   Plan: ANA, Anti-scleroderma antibody, RNP Antibody, Anti-Smith antibody, Sjogrens syndrome-B extractable nuclear antibody, Anti-DNA antibody, double-stranded, Sjogrens syndrome-A extractable nuclear antibody, C3 and C4, Beta-2 glycoprotein antibodies, Cardiolipin antibodies, IgG, IgM, IgA  S/P revision of total knee, right - Performed by Dr. Turner Daniels 12/01/20.  She is on Celebrex and Ultram for pain relief.  Chronic pain of both knees -patient denies any history of swelling.  Right knee joint is swollen currently due to recent surgery.  She has been getting Visco supplement injection to the left knee joint by Dr. Turner Daniels for osteoarthritis.  Patient requests work-up for rheumatoid arthritis.  Plan: Rheumatoid factor, Cyclic citrul peptide antibody, IgG  Injury right ankle, subsequent encounter-patient sprained her right ankle and was evaluated by a PA at [facility] per patient.  She was given a boot.  She will have a follow-up appointment.  Osteopenia, unspecified location-I do not have previous DEXA results available.  Use of calcium rich diet and vitamin D was emphasized.  She was also advised to bring her DEXA results for review at the next visit.  History of hyperlipidemia - She is on Lipitor  Obesity, Class I, BMI 30-34.9  History of depression -she had lost her husband, brother and her son-in-law in the last few years.  She states her depression is better controlled on Wellbutrin and Lexapro  Other insomnia - Her symptoms are better on trazodone 50 mg p.o. nightly  Orders: Orders Placed This Encounter  Procedures   Rheumatoid factor   Cyclic citrul peptide antibody, IgG   ANA   Anti-scleroderma antibody   RNP Antibody   Anti-Smith antibody   Sjogrens syndrome-B extractable nuclear antibody   Anti-DNA antibody, double-stranded   Sjogrens syndrome-A extractable nuclear antibody   C3 and C4   Beta-2 glycoprotein antibodies   Cardiolipin antibodies, IgG, IgM, IgA    No orders of the  defined types were placed in this encounter.    Follow-Up Instructions: Return for +LA.   Pollyann Savoy, MD  Note - This record has been created using Animal nutritionist.  Chart creation errors have been sought, but may not always  have been located. Such creation errors do  not reflect on  the standard of medical care.

## 2020-12-30 DIAGNOSIS — M6281 Muscle weakness (generalized): Secondary | ICD-10-CM | POA: Diagnosis not present

## 2020-12-30 DIAGNOSIS — Z96651 Presence of right artificial knee joint: Secondary | ICD-10-CM | POA: Diagnosis not present

## 2020-12-30 DIAGNOSIS — M25661 Stiffness of right knee, not elsewhere classified: Secondary | ICD-10-CM | POA: Diagnosis not present

## 2021-01-01 DIAGNOSIS — M6281 Muscle weakness (generalized): Secondary | ICD-10-CM | POA: Diagnosis not present

## 2021-01-01 DIAGNOSIS — Z96651 Presence of right artificial knee joint: Secondary | ICD-10-CM | POA: Diagnosis not present

## 2021-01-01 DIAGNOSIS — M1712 Unilateral primary osteoarthritis, left knee: Secondary | ICD-10-CM | POA: Diagnosis not present

## 2021-01-01 DIAGNOSIS — M25661 Stiffness of right knee, not elsewhere classified: Secondary | ICD-10-CM | POA: Diagnosis not present

## 2021-01-01 DIAGNOSIS — M25571 Pain in right ankle and joints of right foot: Secondary | ICD-10-CM | POA: Diagnosis not present

## 2021-01-06 ENCOUNTER — Other Ambulatory Visit: Payer: Medicare Other

## 2021-01-06 DIAGNOSIS — M6281 Muscle weakness (generalized): Secondary | ICD-10-CM | POA: Diagnosis not present

## 2021-01-06 DIAGNOSIS — M25661 Stiffness of right knee, not elsewhere classified: Secondary | ICD-10-CM | POA: Diagnosis not present

## 2021-01-06 DIAGNOSIS — Z96651 Presence of right artificial knee joint: Secondary | ICD-10-CM | POA: Diagnosis not present

## 2021-01-07 ENCOUNTER — Encounter: Payer: Self-pay | Admitting: Rheumatology

## 2021-01-07 ENCOUNTER — Other Ambulatory Visit: Payer: Self-pay

## 2021-01-07 ENCOUNTER — Ambulatory Visit (INDEPENDENT_AMBULATORY_CARE_PROVIDER_SITE_OTHER): Payer: Medicare Other | Admitting: Rheumatology

## 2021-01-07 VITALS — BP 121/85 | HR 99 | Ht <= 58 in | Wt 158.4 lb

## 2021-01-07 DIAGNOSIS — G4709 Other insomnia: Secondary | ICD-10-CM | POA: Diagnosis not present

## 2021-01-07 DIAGNOSIS — R760 Raised antibody titer: Secondary | ICD-10-CM

## 2021-01-07 DIAGNOSIS — G8929 Other chronic pain: Secondary | ICD-10-CM | POA: Diagnosis not present

## 2021-01-07 DIAGNOSIS — S99911D Unspecified injury of right ankle, subsequent encounter: Secondary | ICD-10-CM | POA: Diagnosis not present

## 2021-01-07 DIAGNOSIS — M25562 Pain in left knee: Secondary | ICD-10-CM

## 2021-01-07 DIAGNOSIS — Z8639 Personal history of other endocrine, nutritional and metabolic disease: Secondary | ICD-10-CM | POA: Diagnosis not present

## 2021-01-07 DIAGNOSIS — Z8659 Personal history of other mental and behavioral disorders: Secondary | ICD-10-CM

## 2021-01-07 DIAGNOSIS — M25561 Pain in right knee: Secondary | ICD-10-CM | POA: Diagnosis not present

## 2021-01-07 DIAGNOSIS — M8580 Other specified disorders of bone density and structure, unspecified site: Secondary | ICD-10-CM | POA: Diagnosis not present

## 2021-01-07 DIAGNOSIS — E669 Obesity, unspecified: Secondary | ICD-10-CM

## 2021-01-07 DIAGNOSIS — Z23 Encounter for immunization: Secondary | ICD-10-CM | POA: Diagnosis not present

## 2021-01-07 DIAGNOSIS — Z96651 Presence of right artificial knee joint: Secondary | ICD-10-CM | POA: Diagnosis not present

## 2021-01-08 ENCOUNTER — Encounter: Payer: Self-pay | Admitting: Rheumatology

## 2021-01-08 DIAGNOSIS — M6281 Muscle weakness (generalized): Secondary | ICD-10-CM | POA: Diagnosis not present

## 2021-01-08 DIAGNOSIS — M25661 Stiffness of right knee, not elsewhere classified: Secondary | ICD-10-CM | POA: Diagnosis not present

## 2021-01-08 DIAGNOSIS — Z96651 Presence of right artificial knee joint: Secondary | ICD-10-CM | POA: Diagnosis not present

## 2021-01-08 DIAGNOSIS — M1712 Unilateral primary osteoarthritis, left knee: Secondary | ICD-10-CM | POA: Diagnosis not present

## 2021-01-09 LAB — CARDIOLIPIN ANTIBODIES, IGG, IGM, IGA
Anticardiolipin IgA: <2 APL-U/mL
Anticardiolipin IgG: <2 GPL-U/mL
Anticardiolipin IgM: 6.7 MPL-U/mL

## 2021-01-09 LAB — SJOGRENS SYNDROME-A EXTRACTABLE NUCLEAR ANTIBODY: SSA (Ro) (ENA) Antibody, IgG: <1 AI

## 2021-01-09 LAB — ANTI-NUCLEAR AB-TITER (ANA TITER): ANA Titer 1: 1:80 {titer} — ABNORMAL HIGH

## 2021-01-09 LAB — RNP ANTIBODY: Ribonucleic Protein(ENA) Antibody, IgG: <1 AI

## 2021-01-09 LAB — BETA-2 GLYCOPROTEIN ANTIBODIES
Beta-2 Glyco 1 IgA: <2 U/mL
Beta-2 Glyco 1 IgM: 8 U/mL
Beta-2 Glyco I IgG: <2 U/mL

## 2021-01-09 LAB — ANTI-DNA ANTIBODY, DOUBLE-STRANDED: ds DNA Ab: <1 IU/mL

## 2021-01-09 LAB — RHEUMATOID FACTOR: Rheumatoid fact SerPl-aCnc: <14 IU/mL (ref <14)

## 2021-01-09 LAB — C3 AND C4
C3 Complement: 142 mg/dL (ref 83–193)
C4 Complement: 31 mg/dL (ref 15–57)

## 2021-01-09 LAB — ANA: Anti Nuclear Antibody (ANA): POSITIVE — AB

## 2021-01-09 LAB — CYCLIC CITRUL PEPTIDE ANTIBODY, IGG: Cyclic Citrullin Peptide Ab: <16 UNITS

## 2021-01-09 LAB — SJOGRENS SYNDROME-B EXTRACTABLE NUCLEAR ANTIBODY: SSB (La) (ENA) Antibody, IgG: <1 AI

## 2021-01-09 LAB — ANTI-SCLERODERMA ANTIBODY: Scleroderma (Scl-70) (ENA) Antibody, IgG: <1 AI

## 2021-01-09 LAB — ANTI-SMITH ANTIBODY: ENA SM Ab Ser-aCnc: <1 AI

## 2021-01-09 NOTE — Progress Notes (Signed)
I will discuss results at the follow-up visit.

## 2021-01-13 DIAGNOSIS — Z96651 Presence of right artificial knee joint: Secondary | ICD-10-CM | POA: Diagnosis not present

## 2021-01-13 DIAGNOSIS — M25661 Stiffness of right knee, not elsewhere classified: Secondary | ICD-10-CM | POA: Diagnosis not present

## 2021-01-13 DIAGNOSIS — M6281 Muscle weakness (generalized): Secondary | ICD-10-CM | POA: Diagnosis not present

## 2021-01-15 DIAGNOSIS — M1712 Unilateral primary osteoarthritis, left knee: Secondary | ICD-10-CM | POA: Diagnosis not present

## 2021-01-15 DIAGNOSIS — Z96651 Presence of right artificial knee joint: Secondary | ICD-10-CM | POA: Diagnosis not present

## 2021-01-15 DIAGNOSIS — M6281 Muscle weakness (generalized): Secondary | ICD-10-CM | POA: Diagnosis not present

## 2021-01-15 DIAGNOSIS — M25661 Stiffness of right knee, not elsewhere classified: Secondary | ICD-10-CM | POA: Diagnosis not present

## 2021-01-20 DIAGNOSIS — M25661 Stiffness of right knee, not elsewhere classified: Secondary | ICD-10-CM | POA: Diagnosis not present

## 2021-01-20 DIAGNOSIS — Z96651 Presence of right artificial knee joint: Secondary | ICD-10-CM | POA: Diagnosis not present

## 2021-01-20 DIAGNOSIS — M6281 Muscle weakness (generalized): Secondary | ICD-10-CM | POA: Diagnosis not present

## 2021-01-22 DIAGNOSIS — Z96651 Presence of right artificial knee joint: Secondary | ICD-10-CM | POA: Diagnosis not present

## 2021-01-22 DIAGNOSIS — M25661 Stiffness of right knee, not elsewhere classified: Secondary | ICD-10-CM | POA: Diagnosis not present

## 2021-01-22 DIAGNOSIS — M6281 Muscle weakness (generalized): Secondary | ICD-10-CM | POA: Diagnosis not present

## 2021-01-26 DIAGNOSIS — M6281 Muscle weakness (generalized): Secondary | ICD-10-CM | POA: Diagnosis not present

## 2021-01-26 DIAGNOSIS — Z96651 Presence of right artificial knee joint: Secondary | ICD-10-CM | POA: Diagnosis not present

## 2021-01-26 DIAGNOSIS — M25661 Stiffness of right knee, not elsewhere classified: Secondary | ICD-10-CM | POA: Diagnosis not present

## 2021-01-28 DIAGNOSIS — M25661 Stiffness of right knee, not elsewhere classified: Secondary | ICD-10-CM | POA: Diagnosis not present

## 2021-01-28 DIAGNOSIS — Z96651 Presence of right artificial knee joint: Secondary | ICD-10-CM | POA: Diagnosis not present

## 2021-01-28 DIAGNOSIS — M6281 Muscle weakness (generalized): Secondary | ICD-10-CM | POA: Diagnosis not present

## 2021-01-31 NOTE — Progress Notes (Signed)
Office Visit Note  Patient: Morgan Reed             Date of Birth: 13-Nov-1945           MRN: [id number]             PCP: Lois Huxley, PA Referring: Lois Huxley, [place] Visit Date: 02/10/2021 Occupation: @GUAROCC @  Subjective:  Left hip pain.   History of Present Illness: ANIHYA Reed is a 75 y.o. female with a history of lupus anticoagulant and osteoarthritis.  She states she sprained her right ankle about a month ago.  She has been using a brace and walking with the help of a cane.  She recently started wearing normal shoes and not using cane anymore.  She has been having pain and discomfort in her left hip which she describes over the trochanteric area.  She states it hurts her when she sleeps on the side.  She continues to have some pain and discomfort in her right knee joint.  She had surgery.  She has been followed by Dr. Mayer Camel.  Activities of Daily Living:  Patient reports morning stiffness for a few minutes.   Patient Denies nocturnal pain.  Difficulty dressing/grooming: Denies Difficulty climbing stairs: Denies Difficulty getting out of chair: Denies Difficulty using hands for taps, buttons, cutlery, and/or writing: Denies  Review of Systems  Constitutional:  Negative for fatigue.  HENT:  Positive for mouth dryness. Negative for mouth sores and nose dryness.   Eyes:  Positive for dryness. Negative for pain and itching.  Respiratory:  Negative for shortness of breath and difficulty breathing.   Cardiovascular:  Negative for chest pain and palpitations.  Gastrointestinal:  Negative for blood in stool, constipation and diarrhea.  Endocrine: Negative for increased urination.  Genitourinary:  Negative for difficulty urinating.  Musculoskeletal:  Positive for joint pain, joint pain, joint swelling, myalgias, morning stiffness, muscle tenderness and myalgias.  Skin:  Negative for color change, rash, redness and sensitivity to sunlight.  Allergic/Immunologic: Negative for  susceptible to infections.  Neurological:  Positive for weakness. Negative for dizziness, numbness, headaches and memory loss.  Hematological:  Negative for bruising/bleeding tendency and swollen glands.  Psychiatric/Behavioral:  Negative for depressed mood, confusion and sleep disturbance. The patient is not nervous/anxious.    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   Lupus anticoagulant positive 11/07/2020   Osteoarthritis 10/31/2020   Hyperlipidemia 10/31/2020   Obesity, Class I, BMI 30-34.9 10/31/2020   Osteopenia 10/31/2020   Insomnia 10/31/2020   Abnormal coagulation profile 10/31/2020   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 ([place]) 10/31/2020    Past Medical History:  Diagnosis Date   Arthritis    knees, hands    Family History  Problem Relation Age of Onset   Cancer Mother        leukemia   Cancer Brother        liver cancer   Healthy Son    Healthy Daughter    Past Surgical History:  Procedure Laterality Date   ABDOMINAL HYSTERECTOMY  [id number]   complication with bowel tear   BREAST BIOPSY     right breast benign approx 1990   BREAST EXCISIONAL BIOPSY Right    right breast benign 1990   BUNIONECTOMY     EYE SURGERY Bilateral 2014   REPLACEMENT TOTAL KNEE Right 2013   TONSILLECTOMY     as a  child   TOTAL KNEE REVISION Right 12/01/2020   Procedure: RIGHT TOTAL KNEE REVISION;  Surgeon: Gean Birchwood, MD;  Location: [facility];  Service: Orthopedics;  Laterality: Right;   Social History   Social History Narrative   Not on file   Immunization History  Administered Date(s) Administered   PFIZER(Purple Top)SARS-COV-2 Vaccination 04/13/2019, 05/08/2019     Objective: Vital Signs: BP 124/83 (BP Location: Left Arm, Patient Position: Sitting, Cuff Size: Normal)   Pulse 87   Ht 4\' 9"  (1.448 m)   Wt 160 lb 3.2 oz (72.7 kg)   BMI 34.67 kg/m    Physical Exam Vitals and nursing note  reviewed.  Constitutional:      Appearance: She is well-developed.  HENT:     Head: Normocephalic and atraumatic.  Eyes:     Conjunctiva/sclera: Conjunctivae normal.  Cardiovascular:     Rate and Rhythm: Normal rate and regular rhythm.     Heart sounds: Normal heart sounds.  Pulmonary:     Effort: Pulmonary effort is normal.     Breath sounds: Normal breath sounds.  Abdominal:     General: Bowel sounds are normal.     Palpations: Abdomen is soft.  Musculoskeletal:     Cervical back: Normal range of motion.  Lymphadenopathy:     Cervical: No cervical adenopathy.  Skin:    General: Skin is warm and dry.     Capillary Refill: Capillary refill takes less than 2 seconds.  Neurological:     Mental Status: She is alert and oriented to person, place, and time.  Psychiatric:        Behavior: Behavior normal.     Musculoskeletal Exam: C-spine was in good range of motion.  Shoulder joints, elbow joints, wrist joints, MCPs PIPs and DIPs with good range of motion with no synovitis.  She had good range of motion of bilateral hip joints.  She had tenderness on palpation over her right trochanteric bursa.  Right knee joint was warm to touch.  She had some edema over her right lower extremity.  CDAI Exam: CDAI Score: -- Patient Global: --; Provider Global: -- Swollen: --; Tender: -- Joint Exam 02/10/2021   No joint exam has been documented for this visit   There is currently no information documented on the homunculus. Go to the Rheumatology activity and complete the homunculus joint exam.  Investigation: No additional findings.  Imaging: No results found.  Recent Labs: Lab Results  Component Value Date   WBC 9.3 12/03/2020   HGB 10.0 (L) 12/03/2020   PLT 213 12/03/2020   NA 141 12/02/2020   K 3.9 12/02/2020   CL 109 12/02/2020   CO2 26 12/02/2020   GLUCOSE 151 (H) 12/02/2020   BUN 12 12/02/2020   CREATININE 0.77 12/02/2020   BILITOT 0.6 10/31/2020   ALKPHOS 84 10/31/2020    AST 29 10/31/2020   ALT 54 (H) 10/31/2020   PROT 6.7 10/31/2020   ALBUMIN 4.0 10/31/2020   CALCIUM 9.0 12/02/2020   Morgan 1: 80NH, ENA negative, C3-C4 normal, anticardiolipin negative, beta-2 GP 1 negative, RF negative, anti-CCP negative   Speciality Comments: No specialty comments available.  Procedures:  Large Joint Inj on 02/10/2021 11:19 AM Indications: pain Details: 27 G 1.5 in needle, lateral approach  Arthrogram: No  Medications: 40 mg triamcinolone acetonide 40 MG/ML; 1.5 mL lidocaine 1 % Aspirate: 0 mL Outcome: tolerated well, no immediate complications Procedure, treatment alternatives, risks and benefits explained, specific risks discussed. Consent was given by  the patient. Immediately prior to procedure a time out was called to verify the correct patient, procedure, equipment, support staff and site/side marked as required. Patient was prepped and draped in the usual sterile fashion.    Allergies: Patient has no known allergies.   Assessment / Plan:     Visit Diagnoses: Lupus anticoagulant positive - On October 31, 2020 and November 11, 2020.  She is on aspirin 81 mg p.o. daily, followed by Dr. Alvy Bimler.  There is no history of blood clots.  Anticardiolipin antibody and beta-2 GP 1 are negative.  Regular exercise and increase fluid intake was discussed.  Positive Morgan (antinuclear antibody) - Morgan 1: 80NH, ENA negative, C3-C4 normal, anticardiolipin and beta-2 GP 1 negative no history of oral ulcers, nasal ulcers, malar rash, photosensitivity, Raynauds or lymphadenopathy.  The lab findings were discussed with the patient.  She was advised her to contact me in case she develops any new symptoms.  Trochanteric bursitis, left hip-she has been favoring her right lower extremity due to recent ankle sprain.  She has been having pain and discomfort over the left trochanteric bursa.  She had tenderness on palpation.  She complains of nocturnal pain when she sleeps on her left side.   Clinical findings are consistent with trochanteric bursitis.  IT band stretches were discussed.  Patient requested a cortisone injection.  After informed consent was obtained left trochanteric bursa was injected with cortisone as described above.  She tolerated the procedure well.  Postprocedure instructions were discussed.  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 Mayer Camel.  She takes Celebrex and Ultram for chronic pain.  She continues to have warmth and swelling in her right knee.  Chronic pain of both knees - She gets Visco supplement injections in her left knee by Dr. Mayer Camel.  All autoimmune work-up negative except for low titer positive Morgan which is not significant.  Injury of right ankle, subsequent encounter - She was evaluated at [facility].  Osteopenia, unspecified location - Patient was to bring DEXA results.  History of hyperlipidemia - She is on Lipitor.  Obesity, Class I, BMI 30-34.9  Other insomnia - She takes trazodone 50 mg p.o. nightly  History of depression - Husband, brother and her son-in-law in the last few years.  She is currently on Wellbutrin and Lexapro.  Orders: No orders of the defined types were placed in this encounter.  No orders of the defined types were placed in this encounter.   Follow-Up Instructions: Return in about 1 year (around 02/10/2022) for +Morgan, +LA.   Bo Merino, MD  Note - This record has been created using Editor, commissioning.  Chart creation errors have been sought, but may not always  have been located. Such creation errors do not reflect on  the standard of medical care.

## 2021-02-03 DIAGNOSIS — Z96651 Presence of right artificial knee joint: Secondary | ICD-10-CM | POA: Diagnosis not present

## 2021-02-03 DIAGNOSIS — M25661 Stiffness of right knee, not elsewhere classified: Secondary | ICD-10-CM | POA: Diagnosis not present

## 2021-02-03 DIAGNOSIS — M6281 Muscle weakness (generalized): Secondary | ICD-10-CM | POA: Diagnosis not present

## 2021-02-05 DIAGNOSIS — M25661 Stiffness of right knee, not elsewhere classified: Secondary | ICD-10-CM | POA: Diagnosis not present

## 2021-02-05 DIAGNOSIS — M6281 Muscle weakness (generalized): Secondary | ICD-10-CM | POA: Diagnosis not present

## 2021-02-05 DIAGNOSIS — Z96651 Presence of right artificial knee joint: Secondary | ICD-10-CM | POA: Diagnosis not present

## 2021-02-10 ENCOUNTER — Other Ambulatory Visit: Payer: Self-pay

## 2021-02-10 ENCOUNTER — Ambulatory Visit (INDEPENDENT_AMBULATORY_CARE_PROVIDER_SITE_OTHER): Payer: Medicare Other | Admitting: Rheumatology

## 2021-02-10 ENCOUNTER — Encounter: Payer: Self-pay | Admitting: Rheumatology

## 2021-02-10 VITALS — BP 124/83 | HR 87 | Ht <= 58 in | Wt 160.2 lb

## 2021-02-10 DIAGNOSIS — M25561 Pain in right knee: Secondary | ICD-10-CM | POA: Diagnosis not present

## 2021-02-10 DIAGNOSIS — R760 Raised antibody titer: Secondary | ICD-10-CM

## 2021-02-10 DIAGNOSIS — Z8639 Personal history of other endocrine, nutritional and metabolic disease: Secondary | ICD-10-CM | POA: Diagnosis not present

## 2021-02-10 DIAGNOSIS — E669 Obesity, unspecified: Secondary | ICD-10-CM

## 2021-02-10 DIAGNOSIS — M7062 Trochanteric bursitis, left hip: Secondary | ICD-10-CM

## 2021-02-10 DIAGNOSIS — Z8659 Personal history of other mental and behavioral disorders: Secondary | ICD-10-CM | POA: Diagnosis not present

## 2021-02-10 DIAGNOSIS — S99911D Unspecified injury of right ankle, subsequent encounter: Secondary | ICD-10-CM

## 2021-02-10 DIAGNOSIS — M8580 Other specified disorders of bone density and structure, unspecified site: Secondary | ICD-10-CM | POA: Diagnosis not present

## 2021-02-10 DIAGNOSIS — M25562 Pain in left knee: Secondary | ICD-10-CM | POA: Diagnosis not present

## 2021-02-10 DIAGNOSIS — Z96651 Presence of right artificial knee joint: Secondary | ICD-10-CM

## 2021-02-10 DIAGNOSIS — G4709 Other insomnia: Secondary | ICD-10-CM | POA: Diagnosis not present

## 2021-02-10 DIAGNOSIS — G8929 Other chronic pain: Secondary | ICD-10-CM | POA: Diagnosis not present

## 2021-02-10 DIAGNOSIS — R768 Other specified abnormal immunological findings in serum: Secondary | ICD-10-CM | POA: Diagnosis not present

## 2021-02-10 MED ORDER — LIDOCAINE HCL 1 % IJ SOLN
1.5000 mL | INTRAMUSCULAR | Status: AC | PRN
Start: 2021-02-10 — End: 2021-02-10
  Administered 2021-02-10: 1.5 mL

## 2021-02-10 MED ORDER — TRIAMCINOLONE ACETONIDE 40 MG/ML IJ SUSP
40.0000 mg | INTRAMUSCULAR | Status: AC | PRN
Start: 2021-02-10 — End: 2021-02-10
  Administered 2021-02-10: 40 mg via INTRA_ARTICULAR

## 2021-02-10 NOTE — Patient Instructions (Signed)
Iliotibial Band Syndrome Rehab Ask your health care provider which exercises are safe for you. Do exercises exactly as told by your health care provider and adjust them as directed. It is normal to feel mild stretching, pulling, tightness, or discomfort as you do these exercises. Stop right away if you feel sudden pain or your pain gets significantly worse. Do not begin these exercises until told by your health care provider. Stretching and range-of-motion exercises These exercises warm up your muscles and joints and improve the movement andflexibility of your hip and pelvis. Quadriceps stretch, prone  Lie on your abdomen (prone position) on a firm surface, such as a bed or padded floor. Bend your left / right knee and reach back to hold your ankle or pant leg. If you cannot reach your ankle or pant leg, loop a belt around your foot and grab the belt instead. Gently pull your heel toward your buttocks. Your knee should not slide out to the side. You should feel a stretch in the front of your thigh and knee (quadriceps). Hold this position for __________ seconds. Repeat __________ times. Complete this exercise __________ times a day. Iliotibial band stretch An iliotibial band is a strong band of muscle tissue that runs from the outer side of your hip to the outer side of your thigh and knee. Lie on your side with your left / right leg in the top position. Bend both of your knees and grab your left / right ankle. Stretch out your bottom arm to help you balance. Slowly bring your top knee back so your thigh goes behind your trunk. Slowly lower your top leg toward the floor until you feel a gentle stretch on the outside of your left / right hip and thigh. If you do not feel a stretch and your knee will not fall farther, place the heel of your other foot on top of your knee and pull your knee down toward the floor with your foot. Hold this position for __________ seconds. Repeat __________ times.  Complete this exercise __________ times a day. Strengthening exercises These exercises build strength and endurance in your hip and pelvis. Enduranceis the ability to use your muscles for a long time, even after they get tired. Straight leg raises, side-lying This exercise strengthens the muscles that rotate the leg at the hip and move it away from your body (hip abductors). Lie on your side with your left / right leg in the top position. Lie so your head, shoulder, hip, and knee line up. You may bend your bottom knee to help you balance. Roll your hips slightly forward so your hips are stacked directly over each other and your left / right knee is facing forward. Tense the muscles in your outer thigh and lift your top leg 4-6 inches (10-15 cm). Hold this position for __________ seconds. Slowly lower your leg to return to the starting position. Let your muscles relax completely before doing another repetition. Repeat __________ times. Complete this exercise __________ times a day. Leg raises, prone This exercise strengthens the muscles that move the hips backward (hip extensors). Lie on your abdomen (prone position) on your bed or a firm surface. You can put a pillow under your hips if that is more comfortable for your lower back. Bend your left / right knee so your foot is straight up in the air. Squeeze your buttocks muscles and lift your left / right thigh off the bed. Do not let your back arch. Tense your thigh   muscle as hard as you can without increasing any knee pain. Hold this position for __________ seconds. Slowly lower your leg to return to the starting position and allow it to relax completely. Repeat __________ times. Complete this exercise __________ times a day. Hip hike Stand sideways on a bottom step. Stand on your left / right leg with your other foot unsupported next to the step. You can hold on to a railing or wall for balance if needed. Keep your knees straight and your  torso square. Then lift your left / right hip up toward the ceiling. Slowly let your left / right hip lower toward the floor, past the starting position. Your foot should get closer to the floor. Do not lean or bend your knees. Repeat __________ times. Complete this exercise __________ times a day. This information is not intended to replace advice given to you by your health care provider. Make sure you discuss any questions you have with your healthcare provider. Document Revised: 05/02/2019 Document Reviewed: 05/02/2019 Elsevier Patient Education  2022 [employer]  

## 2021-03-18 DIAGNOSIS — J019 Acute sinusitis, unspecified: Secondary | ICD-10-CM | POA: Diagnosis not present

## 2021-05-07 DIAGNOSIS — M25661 Stiffness of right knee, not elsewhere classified: Secondary | ICD-10-CM | POA: Diagnosis not present

## 2021-06-01 DIAGNOSIS — H26492 Other secondary cataract, left eye: Secondary | ICD-10-CM | POA: Diagnosis not present

## 2021-06-01 DIAGNOSIS — H10413 Chronic giant papillary conjunctivitis, bilateral: Secondary | ICD-10-CM | POA: Diagnosis not present

## 2021-06-30 DIAGNOSIS — G4700 Insomnia, unspecified: Secondary | ICD-10-CM | POA: Diagnosis not present

## 2021-06-30 DIAGNOSIS — Z1211 Encounter for screening for malignant neoplasm of colon: Secondary | ICD-10-CM | POA: Diagnosis not present

## 2021-06-30 DIAGNOSIS — F324 Major depressive disorder, single episode, in partial remission: Secondary | ICD-10-CM | POA: Diagnosis not present

## 2021-06-30 DIAGNOSIS — E2839 Other primary ovarian failure: Secondary | ICD-10-CM | POA: Diagnosis not present

## 2021-06-30 DIAGNOSIS — M1990 Unspecified osteoarthritis, unspecified site: Secondary | ICD-10-CM | POA: Diagnosis not present

## 2021-06-30 DIAGNOSIS — R7303 Prediabetes: Secondary | ICD-10-CM | POA: Diagnosis not present

## 2021-06-30 DIAGNOSIS — E669 Obesity, unspecified: Secondary | ICD-10-CM | POA: Diagnosis not present

## 2021-06-30 DIAGNOSIS — E785 Hyperlipidemia, unspecified: Secondary | ICD-10-CM | POA: Diagnosis not present

## 2021-06-30 DIAGNOSIS — Z1331 Encounter for screening for depression: Secondary | ICD-10-CM | POA: Diagnosis not present

## 2021-06-30 DIAGNOSIS — M8588 Other specified disorders of bone density and structure, other site: Secondary | ICD-10-CM | POA: Diagnosis not present

## 2021-06-30 DIAGNOSIS — Z0000 Encounter for general adult medical examination without abnormal findings: Secondary | ICD-10-CM | POA: Diagnosis not present

## 2021-07-03 DIAGNOSIS — Z961 Presence of intraocular lens: Secondary | ICD-10-CM | POA: Diagnosis not present

## 2021-07-03 DIAGNOSIS — H26493 Other secondary cataract, bilateral: Secondary | ICD-10-CM | POA: Diagnosis not present

## 2021-07-16 DIAGNOSIS — M1712 Unilateral primary osteoarthritis, left knee: Secondary | ICD-10-CM | POA: Diagnosis not present

## 2021-07-23 DIAGNOSIS — M1712 Unilateral primary osteoarthritis, left knee: Secondary | ICD-10-CM | POA: Diagnosis not present

## 2021-07-30 DIAGNOSIS — H26491 Other secondary cataract, right eye: Secondary | ICD-10-CM | POA: Diagnosis not present

## 2021-07-30 DIAGNOSIS — H26492 Other secondary cataract, left eye: Secondary | ICD-10-CM | POA: Diagnosis not present

## 2021-08-06 DIAGNOSIS — M25522 Pain in left elbow: Secondary | ICD-10-CM | POA: Diagnosis not present

## 2021-08-06 DIAGNOSIS — M25521 Pain in right elbow: Secondary | ICD-10-CM | POA: Diagnosis not present

## 2021-08-06 DIAGNOSIS — M7062 Trochanteric bursitis, left hip: Secondary | ICD-10-CM | POA: Diagnosis not present

## 2021-08-18 DIAGNOSIS — M25612 Stiffness of left shoulder, not elsewhere classified: Secondary | ICD-10-CM | POA: Diagnosis not present

## 2021-08-18 DIAGNOSIS — M6281 Muscle weakness (generalized): Secondary | ICD-10-CM | POA: Diagnosis not present

## 2021-08-18 DIAGNOSIS — M25611 Stiffness of right shoulder, not elsewhere classified: Secondary | ICD-10-CM | POA: Diagnosis not present

## 2021-09-30 DIAGNOSIS — M6281 Muscle weakness (generalized): Secondary | ICD-10-CM | POA: Diagnosis not present

## 2021-09-30 DIAGNOSIS — M25612 Stiffness of left shoulder, not elsewhere classified: Secondary | ICD-10-CM | POA: Diagnosis not present

## 2021-09-30 DIAGNOSIS — M25611 Stiffness of right shoulder, not elsewhere classified: Secondary | ICD-10-CM | POA: Diagnosis not present

## 2021-12-04 DIAGNOSIS — Z23 Encounter for immunization: Secondary | ICD-10-CM | POA: Diagnosis not present

## 2022-01-05 DIAGNOSIS — E785 Hyperlipidemia, unspecified: Secondary | ICD-10-CM | POA: Diagnosis not present

## 2022-01-05 DIAGNOSIS — R7303 Prediabetes: Secondary | ICD-10-CM | POA: Diagnosis not present

## 2022-01-05 DIAGNOSIS — L299 Pruritus, unspecified: Secondary | ICD-10-CM | POA: Diagnosis not present

## 2022-01-05 DIAGNOSIS — M8580 Other specified disorders of bone density and structure, unspecified site: Secondary | ICD-10-CM | POA: Diagnosis not present

## 2022-01-05 DIAGNOSIS — M1990 Unspecified osteoarthritis, unspecified site: Secondary | ICD-10-CM | POA: Diagnosis not present

## 2022-01-05 DIAGNOSIS — F324 Major depressive disorder, single episode, in partial remission: Secondary | ICD-10-CM | POA: Diagnosis not present

## 2022-01-05 DIAGNOSIS — G4700 Insomnia, unspecified: Secondary | ICD-10-CM | POA: Diagnosis not present

## 2022-01-05 DIAGNOSIS — E669 Obesity, unspecified: Secondary | ICD-10-CM | POA: Diagnosis not present

## 2022-01-20 DIAGNOSIS — M1712 Unilateral primary osteoarthritis, left knee: Secondary | ICD-10-CM | POA: Diagnosis not present

## 2022-02-01 DIAGNOSIS — Z1331 Encounter for screening for depression: Secondary | ICD-10-CM | POA: Diagnosis not present

## 2022-02-01 DIAGNOSIS — F324 Major depressive disorder, single episode, in partial remission: Secondary | ICD-10-CM | POA: Diagnosis not present

## 2022-02-01 DIAGNOSIS — Z6833 Body mass index (BMI) 33.0-33.9, adult: Secondary | ICD-10-CM | POA: Diagnosis not present

## 2022-02-01 DIAGNOSIS — E8889 Other specified metabolic disorders: Secondary | ICD-10-CM | POA: Diagnosis not present

## 2022-02-01 DIAGNOSIS — E669 Obesity, unspecified: Secondary | ICD-10-CM | POA: Diagnosis not present

## 2022-02-01 DIAGNOSIS — R7303 Prediabetes: Secondary | ICD-10-CM | POA: Diagnosis not present

## 2022-02-01 DIAGNOSIS — E785 Hyperlipidemia, unspecified: Secondary | ICD-10-CM | POA: Diagnosis not present

## 2022-02-01 DIAGNOSIS — M1990 Unspecified osteoarthritis, unspecified site: Secondary | ICD-10-CM | POA: Diagnosis not present

## 2022-02-10 ENCOUNTER — Ambulatory Visit: Payer: Medicare Other | Admitting: Rheumatology

## 2022-02-15 DIAGNOSIS — E669 Obesity, unspecified: Secondary | ICD-10-CM | POA: Diagnosis not present

## 2022-02-15 DIAGNOSIS — R7303 Prediabetes: Secondary | ICD-10-CM | POA: Diagnosis not present

## 2022-02-15 DIAGNOSIS — Z6832 Body mass index (BMI) 32.0-32.9, adult: Secondary | ICD-10-CM | POA: Diagnosis not present

## 2022-02-15 DIAGNOSIS — M1990 Unspecified osteoarthritis, unspecified site: Secondary | ICD-10-CM | POA: Diagnosis not present

## 2022-02-18 DIAGNOSIS — U071 COVID-19: Secondary | ICD-10-CM | POA: Diagnosis not present

## 2022-02-24 DIAGNOSIS — M1712 Unilateral primary osteoarthritis, left knee: Secondary | ICD-10-CM | POA: Diagnosis not present

## 2022-03-03 DIAGNOSIS — M1712 Unilateral primary osteoarthritis, left knee: Secondary | ICD-10-CM | POA: Diagnosis not present

## 2022-03-15 DIAGNOSIS — E669 Obesity, unspecified: Secondary | ICD-10-CM | POA: Diagnosis not present

## 2022-03-15 DIAGNOSIS — Z6831 Body mass index (BMI) 31.0-31.9, adult: Secondary | ICD-10-CM | POA: Diagnosis not present

## 2022-03-15 DIAGNOSIS — M1990 Unspecified osteoarthritis, unspecified site: Secondary | ICD-10-CM | POA: Diagnosis not present

## 2022-03-15 DIAGNOSIS — R7303 Prediabetes: Secondary | ICD-10-CM | POA: Diagnosis not present

## 2022-04-05 DIAGNOSIS — R7303 Prediabetes: Secondary | ICD-10-CM | POA: Diagnosis not present

## 2022-04-05 DIAGNOSIS — M1990 Unspecified osteoarthritis, unspecified site: Secondary | ICD-10-CM | POA: Diagnosis not present

## 2022-04-05 DIAGNOSIS — E669 Obesity, unspecified: Secondary | ICD-10-CM | POA: Diagnosis not present

## 2022-04-05 DIAGNOSIS — Z6831 Body mass index (BMI) 31.0-31.9, adult: Secondary | ICD-10-CM | POA: Diagnosis not present

## 2022-04-12 DIAGNOSIS — L853 Xerosis cutis: Secondary | ICD-10-CM | POA: Diagnosis not present

## 2022-04-12 DIAGNOSIS — L718 Other rosacea: Secondary | ICD-10-CM | POA: Diagnosis not present

## 2022-04-12 DIAGNOSIS — L298 Other pruritus: Secondary | ICD-10-CM | POA: Diagnosis not present

## 2022-04-26 DIAGNOSIS — M1990 Unspecified osteoarthritis, unspecified site: Secondary | ICD-10-CM | POA: Diagnosis not present

## 2022-04-26 DIAGNOSIS — R7303 Prediabetes: Secondary | ICD-10-CM | POA: Diagnosis not present

## 2022-04-26 DIAGNOSIS — E669 Obesity, unspecified: Secondary | ICD-10-CM | POA: Diagnosis not present

## 2022-04-26 DIAGNOSIS — Z6831 Body mass index (BMI) 31.0-31.9, adult: Secondary | ICD-10-CM | POA: Diagnosis not present

## 2022-04-27 DIAGNOSIS — M19012 Primary osteoarthritis, left shoulder: Secondary | ICD-10-CM | POA: Diagnosis not present

## 2022-04-27 DIAGNOSIS — M19011 Primary osteoarthritis, right shoulder: Secondary | ICD-10-CM | POA: Diagnosis not present

## 2022-05-24 DIAGNOSIS — R7303 Prediabetes: Secondary | ICD-10-CM | POA: Diagnosis not present

## 2022-05-24 DIAGNOSIS — E663 Overweight: Secondary | ICD-10-CM | POA: Diagnosis not present

## 2022-05-24 DIAGNOSIS — M1990 Unspecified osteoarthritis, unspecified site: Secondary | ICD-10-CM | POA: Diagnosis not present

## 2022-05-24 DIAGNOSIS — Z6829 Body mass index (BMI) 29.0-29.9, adult: Secondary | ICD-10-CM | POA: Diagnosis not present

## 2022-06-08 DIAGNOSIS — L304 Erythema intertrigo: Secondary | ICD-10-CM | POA: Diagnosis not present

## 2022-06-08 DIAGNOSIS — L298 Other pruritus: Secondary | ICD-10-CM | POA: Diagnosis not present

## 2022-06-08 DIAGNOSIS — D1801 Hemangioma of skin and subcutaneous tissue: Secondary | ICD-10-CM | POA: Diagnosis not present

## 2022-06-08 DIAGNOSIS — L814 Other melanin hyperpigmentation: Secondary | ICD-10-CM | POA: Diagnosis not present

## 2022-06-08 DIAGNOSIS — L821 Other seborrheic keratosis: Secondary | ICD-10-CM | POA: Diagnosis not present

## 2022-06-24 DIAGNOSIS — Z6829 Body mass index (BMI) 29.0-29.9, adult: Secondary | ICD-10-CM | POA: Diagnosis not present

## 2022-06-24 DIAGNOSIS — M1990 Unspecified osteoarthritis, unspecified site: Secondary | ICD-10-CM | POA: Diagnosis not present

## 2022-06-24 DIAGNOSIS — E663 Overweight: Secondary | ICD-10-CM | POA: Diagnosis not present

## 2022-06-24 DIAGNOSIS — R7303 Prediabetes: Secondary | ICD-10-CM | POA: Diagnosis not present

## 2022-07-07 DIAGNOSIS — R011 Cardiac murmur, unspecified: Secondary | ICD-10-CM | POA: Diagnosis not present

## 2022-07-07 DIAGNOSIS — E785 Hyperlipidemia, unspecified: Secondary | ICD-10-CM | POA: Diagnosis not present

## 2022-07-07 DIAGNOSIS — M19012 Primary osteoarthritis, left shoulder: Secondary | ICD-10-CM | POA: Diagnosis not present

## 2022-07-07 DIAGNOSIS — R7303 Prediabetes: Secondary | ICD-10-CM | POA: Diagnosis not present

## 2022-07-07 DIAGNOSIS — E663 Overweight: Secondary | ICD-10-CM | POA: Diagnosis not present

## 2022-07-07 DIAGNOSIS — F324 Major depressive disorder, single episode, in partial remission: Secondary | ICD-10-CM | POA: Diagnosis not present

## 2022-07-07 DIAGNOSIS — R7309 Other abnormal glucose: Secondary | ICD-10-CM | POA: Diagnosis not present

## 2022-07-07 DIAGNOSIS — G4700 Insomnia, unspecified: Secondary | ICD-10-CM | POA: Diagnosis not present

## 2022-07-07 DIAGNOSIS — M19011 Primary osteoarthritis, right shoulder: Secondary | ICD-10-CM | POA: Diagnosis not present

## 2022-07-07 DIAGNOSIS — M1990 Unspecified osteoarthritis, unspecified site: Secondary | ICD-10-CM | POA: Diagnosis not present

## 2022-07-07 DIAGNOSIS — M8588 Other specified disorders of bone density and structure, other site: Secondary | ICD-10-CM | POA: Diagnosis not present

## 2022-07-20 DIAGNOSIS — M1712 Unilateral primary osteoarthritis, left knee: Secondary | ICD-10-CM | POA: Diagnosis not present

## 2022-07-20 NOTE — Progress Notes (Unsigned)
   Rubin Payor, PhD, LAT, ATC acting as a scribe for Morgan Graham, MD.  Morgan Reed is a 77 y.o. female who presents to [facility] at [facility] today for bilat shoulder pain ongoing x about 1 year. She moved/relocated around the time. She's been seen previously at [facility]. Pt locates pain to all over both shoulder joints. She notes a lack of AROM. Of note, pt is under the care of [facility] for lupus. Pain is waking her up at night  At [facility] orthopedics she had bilateral shoulder x-rays about 2 or 3 months ago that showed severe glenohumeral DJD.  She did see Dr. Ave Filter who advised shoulder replacement.  She has had cortisone shots most recently almost 3 months ago bilateral glenohumeral joints that provided relief until about now.  She is scheduled next week for bilateral glenohumeral shoulder injections. She did have a trial of physical therapy which was mildly helpful.  Neck pain: no Radiates: yes- into upper arm UE Numbness/tingling: no UE Weakness: yes Aggravates: IR Treatments tried: prior PT, water aerobics,   Pertinent review of systems: No fevers or chills  Relevant historical information: Lupus anticoagulant positive.  History of knee replacement right knee. Takes 81 mg of aspirin daily and takes Celebrex daily.  Exam:  BP 114/76   Pulse 83   Ht 4\' 9"  (1.448 m)   Wt 144 lb (65.3 kg)   SpO2 99%   BMI 31.16 kg/m  General: Well Developed, well nourished, and in no acute distress.   MSK: Shoulders bilaterally normal.  Decreased range of motion       Assessment and Plan: 77 y.o. female with bilateral chronic shoulder pain thought to be due to glenohumeral DJD.  She has had what sounds like a good workup and trial of initial conservative management at [facility] orthopedics with trial of physical therapy and home exercise program, and glenohumeral steroid injections bilaterally.  She is interested in pursuing PRP  injection both shoulders in the future. This could help.  There is not great evidence for PRP injection for glenohumeral arthritis related pain.  It is unlikely to hurt and may help.  She is scheduled to have glenohumeral steroid injections in about a week.  This would interfere with the potential benefit from PRP.  Additionally she is currently taking aspirin and an NSAID.  I recommend that we schedule tentatively bilateral PRP injections in about 7 weeks.  This will be about 6 weeks after glenohumeral injection we can anticipate that her pain is starting to return.  She should stop aspirin about a week before the injection and stop Celebrex about a day before the injection.  Will request medical records from [facility] orthopedics including x-rays and recent notes.    Discussed warning signs or symptoms. Please see discharge instructions. Patient expresses understanding.   The above documentation has been reviewed and is accurate and complete Morgan Reed, M.D. Total encounter time 30 minutes including face-to-face time with the patient and, reviewing past medical record, and charting on the date of service.

## 2022-07-21 ENCOUNTER — Encounter: Payer: Self-pay | Admitting: Family Medicine

## 2022-07-21 ENCOUNTER — Other Ambulatory Visit: Payer: Self-pay

## 2022-07-21 ENCOUNTER — Ambulatory Visit (INDEPENDENT_AMBULATORY_CARE_PROVIDER_SITE_OTHER): Payer: Medicare Other | Admitting: Family Medicine

## 2022-07-21 VITALS — BP 114/76 | HR 83 | Ht <= 58 in | Wt 144.0 lb

## 2022-07-21 DIAGNOSIS — G8929 Other chronic pain: Secondary | ICD-10-CM | POA: Diagnosis not present

## 2022-07-21 DIAGNOSIS — M19011 Primary osteoarthritis, right shoulder: Secondary | ICD-10-CM | POA: Insufficient documentation

## 2022-07-21 DIAGNOSIS — M25511 Pain in right shoulder: Secondary | ICD-10-CM | POA: Diagnosis not present

## 2022-07-21 DIAGNOSIS — M19012 Primary osteoarthritis, left shoulder: Secondary | ICD-10-CM

## 2022-07-21 DIAGNOSIS — M25512 Pain in left shoulder: Secondary | ICD-10-CM | POA: Diagnosis not present

## 2022-07-21 NOTE — Patient Instructions (Addendum)
Thank you for coming in today.   I recommend scheduling PRP injections in both shoulders in 7 weeks or so.   STOP aspirin about 1 week before the injection.   STOP celebrex the night before the injection.   TYLENOL is OK.   You can always double check with me before the shot if needed.

## 2022-07-22 DIAGNOSIS — M1990 Unspecified osteoarthritis, unspecified site: Secondary | ICD-10-CM | POA: Diagnosis not present

## 2022-07-22 DIAGNOSIS — Z6829 Body mass index (BMI) 29.0-29.9, adult: Secondary | ICD-10-CM | POA: Diagnosis not present

## 2022-07-22 DIAGNOSIS — E663 Overweight: Secondary | ICD-10-CM | POA: Diagnosis not present

## 2022-07-22 DIAGNOSIS — R7303 Prediabetes: Secondary | ICD-10-CM | POA: Diagnosis not present

## 2022-07-22 NOTE — Telephone Encounter (Signed)
Forwarding to Dr. Denyse Amass to address after reviewing records and results.

## 2022-07-23 NOTE — Telephone Encounter (Signed)
Received medical records from [facility] orthopedics.  Patient had x-rays of both shoulders dated February 2024 ago for orthopedics noting severe glenohumeral arthritis bilaterally with a large drop osteophyte formation present.  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  She has had a knee replacement revision with stem right knee

## 2022-07-29 DIAGNOSIS — M19012 Primary osteoarthritis, left shoulder: Secondary | ICD-10-CM | POA: Diagnosis not present

## 2022-07-29 DIAGNOSIS — M19011 Primary osteoarthritis, right shoulder: Secondary | ICD-10-CM | POA: Diagnosis not present

## 2022-08-19 DIAGNOSIS — R7303 Prediabetes: Secondary | ICD-10-CM | POA: Diagnosis not present

## 2022-08-19 DIAGNOSIS — M1990 Unspecified osteoarthritis, unspecified site: Secondary | ICD-10-CM | POA: Diagnosis not present

## 2022-08-19 DIAGNOSIS — Z6828 Body mass index (BMI) 28.0-28.9, adult: Secondary | ICD-10-CM | POA: Diagnosis not present

## 2022-08-19 DIAGNOSIS — E663 Overweight: Secondary | ICD-10-CM | POA: Diagnosis not present

## 2022-09-01 DIAGNOSIS — H04123 Dry eye syndrome of bilateral lacrimal glands: Secondary | ICD-10-CM | POA: Diagnosis not present

## 2022-09-01 DIAGNOSIS — Z961 Presence of intraocular lens: Secondary | ICD-10-CM | POA: Diagnosis not present

## 2022-09-01 DIAGNOSIS — H52203 Unspecified astigmatism, bilateral: Secondary | ICD-10-CM | POA: Diagnosis not present

## 2022-09-07 ENCOUNTER — Encounter: Payer: Self-pay | Admitting: Family Medicine

## 2022-09-07 ENCOUNTER — Ambulatory Visit (INDEPENDENT_AMBULATORY_CARE_PROVIDER_SITE_OTHER): Payer: Medicare Other | Admitting: Family Medicine

## 2022-09-07 VITALS — BP 100/72 | HR 72 | Ht 59.0 in | Wt 140.0 lb

## 2022-09-07 DIAGNOSIS — M25511 Pain in right shoulder: Secondary | ICD-10-CM | POA: Diagnosis not present

## 2022-09-07 DIAGNOSIS — G8929 Other chronic pain: Secondary | ICD-10-CM

## 2022-09-07 DIAGNOSIS — M25512 Pain in left shoulder: Secondary | ICD-10-CM | POA: Diagnosis not present

## 2022-09-07 NOTE — Patient Instructions (Signed)
Thank you for coming in today.   Lets schedule PRP both shoulders next week or the week after.   STOP Asprin now.  We want you to be off Asprin for about 1 week.   STOP Celebrex and or Ibuprofen 1 day before PRP.   Tylenol and Tramadol are ok.

## 2022-09-07 NOTE — Progress Notes (Signed)
   I, Stevenson Clinch, CMA acting as a scribe for [name], MD.  Morgan Reed is a 77 y.o. female who presents to [facility] at [facility] today for bilat shoulder pain and consultation on PRP. C/O BILAT shoulder pain over the past year, started after moving and lifting heavy boxes. Pain at lateral aspect of the shoulders. Denies sharp shooting pain but endorses weakness when doing things such as holding her coffee mug. Denies neck pain. Limited ROM, worse when reaching up and when reaching back. Would like to avoid surgery as long as possible.  She is currently taking aspirin.  She also takes Celebrex or ibuprofen almost daily.  She uses Tylenol and tramadol for pain control as well.  Pertinent review of systems: No fevers or chills  Relevant historical information: Possible lupus anticoagulant no history of bleeding   Exam:  BP 100/72   Pulse 72   Ht 4\' 11"  (1.499 m)   Wt 140 lb (63.5 kg)   SpO2 96%   BMI 28.28 kg/m  General: Well Developed, well nourished, and in no acute distress.   MSK: Decreased shoulder motion bilaterally    Lab and Radiology Results  Lab Results  Component Value Date   WBC 9.3 12/03/2020   HGB 10.0 (L) 12/03/2020   HCT 30.6 (L) 12/03/2020   MCV 92.4 12/03/2020   PLT 213 12/03/2020      Assessment and Plan: 77 y.o. 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  Fundamentally she needs shoulder replacement.  She would like to avoid surgery and is looking for some other options.  She had steroid injections about 6 weeks ago at [facility] orth[facility] which only lasted a week or 2.   After discussion today we will plan for PRP.  She should stop her aspirin now.  She should stop her NSAIDs 1 day prior to the injection.  Okay to continue Tylenol or tramadol.  She will schedule back for planned PRP injection in the near future.   PDMP not reviewed this encounter. No orders of the defined types were placed in this  encounter.  No orders of the defined types were placed in this encounter.    Discussed warning signs or symptoms. Please see discharge instructions. Patient expresses understanding.   The above documentation has been reviewed and is accurate and complete [name], M.D. Total encounter time 20 minutes including face-to-face time with the patient and, reviewing past medical record, and charting on the date of service.

## 2022-09-13 ENCOUNTER — Ambulatory Visit: Payer: Medicare Other | Admitting: Family Medicine

## 2022-09-15 ENCOUNTER — Other Ambulatory Visit: Payer: Self-pay

## 2022-09-15 ENCOUNTER — Ambulatory Visit: Payer: Self-pay | Admitting: Family Medicine

## 2022-09-15 DIAGNOSIS — M25512 Pain in left shoulder: Secondary | ICD-10-CM

## 2022-09-15 DIAGNOSIS — G8929 Other chronic pain: Secondary | ICD-10-CM

## 2022-09-15 DIAGNOSIS — M19011 Primary osteoarthritis, right shoulder: Secondary | ICD-10-CM

## 2022-09-15 DIAGNOSIS — M25511 Pain in right shoulder: Secondary | ICD-10-CM

## 2022-09-15 DIAGNOSIS — M19012 Primary osteoarthritis, left shoulder: Secondary | ICD-10-CM

## 2022-09-15 NOTE — Progress Notes (Signed)
Jema presents to clinic today for bilateral glenohumeral PRP injections.  Procedure: Real-time Ultrasound Guided Injection of right shoulder glenohumeral joint Device: Philips Affiniti 50G/GE Logiq Images permanently stored and available for review in PACS Verbal informed consent obtained.  Discussed risks and benefits of procedure. Warned about infection, bleeding, hyperglycemia damage to structures among others. Patient expresses understanding and agreement Time-out conducted.   Noted no overlying erythema, induration, or other signs of local infection.   Skin prepped in a sterile fashion.   Local anesthesia: Topical Ethyl chloride.   With sterile technique and under real time ultrasound guidance: Leukocyte depleted PRP 6 ml injected into glenohumeral joint. Fluid seen entering the joint capsule.   Completed without difficulty   Advised to call if fevers/chills, erythema, induration, drainage, or persistent bleeding.   Images permanently stored and available for review in the ultrasound unit.  Impression: Technically successful ultrasound guided injection.    Procedure: Real-time Ultrasound Guided Injection of left shoulder glenohumeral joint Device: Philips Affiniti 50G/GE Logiq Images permanently stored and available for review in PACS Verbal informed consent obtained.  Discussed risks and benefits of procedure. Warned about infection, bleeding, hyperglycemia damage to structures among others. Patient expresses understanding and agreement Time-out conducted.   Noted no overlying erythema, induration, or other signs of local infection.   Skin prepped in a sterile fashion.   Local anesthesia: Topical Ethyl chloride.   With sterile technique and under real time ultrasound guidance: Leukocyte depleted PRP 6 milliliters injected into shoulder joint. Fluid seen entering the joint capsule.   Completed without difficulty   Advised to call if fevers/chills, erythema, induration, drainage,  or persistent bleeding.   Images permanently stored and available for review in the ultrasound unit.  Impression: Technically successful ultrasound guided injection.   Return as needed. She has tramadol. She could call in for hydrocodone or oxycodone if needed.

## 2022-09-15 NOTE — Patient Instructions (Signed)
Thank you for coming in today.   Call or go to the ER if you develop a large red swollen joint with extreme pain or oozing puss.    Hold of on the celebrex for a few weeks if possible.   Ok to resume aspirin today or tomorrow.   Ok to use tylenol and tramadol for pain if needed.

## 2022-09-16 ENCOUNTER — Encounter: Payer: Self-pay | Admitting: Family Medicine

## 2022-09-30 NOTE — Telephone Encounter (Signed)
Forwarding to Dr. Georgina Snell as Juluis Rainier.

## 2022-10-12 DIAGNOSIS — M1712 Unilateral primary osteoarthritis, left knee: Secondary | ICD-10-CM | POA: Diagnosis not present

## 2022-10-19 DIAGNOSIS — M1712 Unilateral primary osteoarthritis, left knee: Secondary | ICD-10-CM | POA: Diagnosis not present

## 2022-10-21 DIAGNOSIS — E663 Overweight: Secondary | ICD-10-CM | POA: Diagnosis not present

## 2022-10-21 DIAGNOSIS — M1990 Unspecified osteoarthritis, unspecified site: Secondary | ICD-10-CM | POA: Diagnosis not present

## 2022-10-21 DIAGNOSIS — Z6828 Body mass index (BMI) 28.0-28.9, adult: Secondary | ICD-10-CM | POA: Diagnosis not present

## 2022-10-21 DIAGNOSIS — R7303 Prediabetes: Secondary | ICD-10-CM | POA: Diagnosis not present

## 2022-10-25 NOTE — Progress Notes (Unsigned)
Office Visit Note  Patient: Morgan Reed             Date of Birth: Aug 29, 1945           MRN: [id number]             PCP: Wilfrid Lund, PA Referring: Wilfrid Lund, [place] Visit Date: 11/02/2022 Occupation: @GUAROCC @  Subjective:  Total body pain   History of Present Illness: Morgan Reed is a 77 y.o. female with history of positive ANA and lupus anticoagulant positive.  Patient was last seen in the office on 02/10/2021.  She has been under the care of [facility] as well as Dr. [name] for management of osteoarthritic pain.  She takes tylenol, celebrex, and tramadol for pain relief.  She continues to have significant pain involving multiple joints despite her current treatment regimen.  Her quality of life has been affected by her pain level.  She has been trying to go to water aerobics 3 days a week but continues to have pain and stiffness involving multiple joints.  Patient states her pain has been most severe in both shoulders but she is also having total body pain.  She has had to take tramadol more frequently to alleviate her symptoms.  Activities of Daily Living:  Patient reports morning stiffness for 1-2 hours.   Patient Reports nocturnal pain.  Difficulty dressing/grooming: Denies Difficulty climbing stairs: Denies Difficulty getting out of chair: Denies Difficulty using hands for taps, buttons, cutlery, and/or writing: Denies  Review of Systems  Constitutional:  Positive for fatigue.  HENT:  Positive for mouth dryness. Negative for mouth sores.   Eyes:  Negative for dryness.  Respiratory:  Negative for shortness of breath.   Cardiovascular:  Negative for chest pain and palpitations.  Gastrointestinal:  Negative for blood in stool, constipation and diarrhea.  Endocrine: Negative for increased urination.  Genitourinary:  Negative for involuntary urination.  Musculoskeletal:  Positive for joint pain, joint pain, myalgias, muscle weakness, morning stiffness, muscle  tenderness and myalgias. Negative for gait problem and joint swelling.  Skin:  Negative for color change, rash, hair loss and sensitivity to sunlight.  Allergic/Immunologic: Negative for susceptible to infections.  Neurological:  Negative for dizziness and headaches.  Hematological:  Negative for swollen glands.  Psychiatric/Behavioral:  Positive for sleep disturbance. Negative for depressed mood. The patient is not nervous/anxious.     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   Lupus anticoagulant positive 11/07/2020   Osteoarthritis 10/31/2020   Hyperlipidemia 10/31/2020   Obesity, Class I, BMI 30-34.9 10/31/2020   Osteopenia 10/31/2020   Insomnia 10/31/2020   Abnormal coagulation profile 10/31/2020   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) 10/31/2020    Past Medical History:  Diagnosis Date   Arthritis    knees, hands    Family History  Problem Relation Age of Onset   Cancer Mother        leukemia   Cancer Brother        liver cancer   Healthy Son    Healthy Daughter    Past Surgical History:  Procedure Laterality Date   ABDOMINAL HYSTERECTOMY  1995   complication with bowel tear   BREAST BIOPSY     right breast benign approx 1990   BREAST EXCISIONAL BIOPSY Right  right breast benign 1990   BUNIONECTOMY     EYE SURGERY Bilateral 2014   REPLACEMENT TOTAL KNEE Right 2013   TONSILLECTOMY     as a child   TOTAL KNEE REVISION Right 12/01/2020   Procedure: RIGHT TOTAL KNEE REVISION;  Surgeon: Gean Birchwood, MD;  Location: [facility];  Service: Orthopedics;  Laterality: Right;   Social History   Social History Narrative   Not on file   Immunization History  Administered Date(s) Administered   PFIZER(Purple Top)SARS-COV-2 Vaccination 04/13/2019, 05/08/2019     Objective: Vital Signs: BP 109/75 (BP  Location: Left Arm, Patient Position: Sitting, Cuff Size: Normal)   Pulse 80   Resp 15   Ht 4\' 11"  (1.499 m)   Wt 138 lb 12.8 oz (63 kg)   BMI 28.03 kg/m    Physical Exam Vitals and nursing note reviewed.  Constitutional:      Appearance: She is well-developed.  HENT:     Head: Normocephalic and atraumatic.  Eyes:     Conjunctiva/sclera: Conjunctivae normal.  Cardiovascular:     Rate and Rhythm: Normal rate and regular rhythm.     Heart sounds: Normal heart sounds.  Pulmonary:     Effort: Pulmonary effort is normal.     Breath sounds: Normal breath sounds.  Abdominal:     General: Bowel sounds are normal.     Palpations: Abdomen is soft.  Musculoskeletal:     Cervical back: Normal range of motion.  Skin:    General: Skin is warm and dry.     Capillary Refill: Capillary refill takes less than 2 seconds.     Comments: Faint facial erythema noted  Neurological:     Mental Status: She is alert and oriented to person, place, and time.  Psychiatric:        Behavior: Behavior normal.      Musculoskeletal Exam: C-spine limited ROM with lateral rotation.  Shoulder joints have painful and limited range of motion bilaterally.  Tenderness over both shoulders noted.  Elbow joints have good range of motion.  PIP and DIP thickening consistent with osteoarthritis of both hands.  CMC joint thickening noted.  Hip joints have good range of motion with no groin pain. Right knee replacement has good ROM.  Left knee joint has good ROM with no warmth or effusion.  Ankle joints have good ROM with no tenderness or joint swelling.   CDAI Exam: CDAI Score: -- Patient Global: --; Provider Global: -- Swollen: --; Tender: -- Joint Exam 11/02/2022   No joint exam has been documented for this visit   There is currently no information documented on the homunculus. Go to the Rheumatology activity and complete the homunculus joint exam.  Investigation: No additional findings.  Imaging: No results  found.  Recent Labs: Lab Results  Component Value Date   WBC 9.3 12/03/2020   HGB 10.0 (L) 12/03/2020   PLT 213 12/03/2020   NA 141 12/02/2020   K 3.9 12/02/2020   CL 109 12/02/2020   CO2 26 12/02/2020   GLUCOSE 151 (H) 12/02/2020   BUN 12 12/02/2020   CREATININE 0.77 12/02/2020   BILITOT 0.6 10/31/2020   ALKPHOS 84 10/31/2020   AST 29 10/31/2020   ALT 54 (H) 10/31/2020   PROT 6.7 10/31/2020   ALBUMIN 4.0 10/31/2020   CALCIUM 9.0 12/02/2020    Speciality Comments: No specialty comments available.  Procedures:  No procedures performed Allergies: Atorvastatin   Assessment / Plan:     Visit  Diagnoses: Lupus anticoagulant positive - October 31, 2020 and November 11, 2020.  She remains on Aspirin 81 mg 2 tablets daily.  Evaluated by Dr. Bertis Ruddy in the past.  No history of VTE. Anticardiolipin ab and beta-2 GP negative on 01/07/21. The following lab work will be updated today.  - Plan: ANA, Beta-2 glycoprotein antibodies, Cardiolipin antibodies, IgG, IgM, IgA, Lupus Anticoagulant Eval w/Reflex  Positive ANA (antinuclear antibody) - ANA 1: 80NH, ENA negative, C3-C4 normal, anticardiolipin and beta-2 GP 1 negative:  Faint facial erythema noted on examination today.  No other clinical features of systemic lupus.  Plan to update the following lab work today for further evaluation.   Plan: ANA, COMPLETE METABOLIC PANEL WITH GFR, CBC with Differential/Platelet, Anti-DNA antibody, double-stranded, Protein / creatinine ratio, urine, C3 and C4, Sedimentation rate, Beta-2 glycoprotein antibodies, Cardiolipin antibodies, IgG, IgM, IgA, Lupus Anticoagulant Eval w/Reflex, Rheumatoid factor, Cyclic citrul peptide antibody, IgG, C-reactive protein  Polyarthralgia -Patient was last seen in the office on 02/10/2021.  Patient presents today with chronic pain involving multiple joints.  Her pain has been most severe in both shoulders but she has also had total body pain.  She is taking Celebrex and  tramadol for pain relief but continues to have significant breakthrough symptoms.  Her quality of life has been suffering due to the severity of pain that she has been experiencing.  She has been trying to go to water aerobics 3 days a week and has also been working on weight loss.  Plan to refer the patient for aquatic physical therapy at [facility].  Referral to pain management was also placed as requested.  The following lab work will be obtained today for further evaluation of the increased arthralgias and joint stiffness she is experiencing.  Plan: ANA, COMPLETE METABOLIC PANEL WITH GFR, CBC with Differential/Platelet, Anti-DNA antibody, double-stranded, Protein / creatinine ratio, urine, C3 and C4, Sedimentation rate, Beta-2 glycoprotein antibodies, Cardiolipin antibodies, IgG, IgM, IgA, Lupus Anticoagulant Eval w/Reflex, Rheumatoid factor, Cyclic citrul peptide antibody, IgG, C-reactive protein  Chronic pain of both shoulders: Chronic pain.  Evaluated by Dr. Ave Filter.  According to the patient there was discussion of proceeding with shoulder replacement in the future but she does not want to proceed with surgical intervention at this time.  She established care with Dr. Denyse Amass and underwent PRP injections which provided relief for only 1 month.  She continues to have chronic pain and stiffness in both shoulder joints.  She is been taking Celebrex and tramadol for pain relief.  She has had to take tramadol more frequently due to the severity of pain.  Patient presented today to discuss other treatment options besides surgical intervention.  Plan to check sed rate, CRP, rheumatoid factor, anti-CCP today.  Plan to also place referral to aquatic therapy as well as to pain management.  Trochanteric bursitis, left [id number] injection performed on 02/10/21.   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 Turner Daniels.  Chronic pain of both knees - Left knee-Patient continues to have  visco-supplementation  performed by Dr. Turner Daniels. No warmth or effusion noted on examination today.  She has been working on weight loss and trying to go to water aerobics 3 days a week.  She does not plan to proceed with a left knee replacement at this time.  Patient requested a referral to pain management.  A referral to aquatic therapy will also be placed today.  Other medical conditions are listed as follows:   Injury of  right ankle, subsequent encounter  History of hyperlipidemia  Other insomnia  History of depression    Orders: Orders Placed This Encounter  Procedures   ANA   COMPLETE METABOLIC PANEL WITH GFR   CBC with Differential/Platelet   Anti-DNA antibody, double-stranded   Protein / creatinine ratio, urine   C3 and C4   Sedimentation rate   Beta-2 glycoprotein antibodies   Cardiolipin antibodies, IgG, IgM, IgA   Lupus Anticoagulant Eval w/Reflex   Rheumatoid factor   Cyclic citrul peptide antibody, IgG   C-reactive protein   No orders of the defined types were placed in this encounter.    Follow-Up Instructions: Return in about 1 year (around 11/02/2023) for +ANA.   Gearldine Bienenstock, PA-C  Note - This record has been created using Dragon software.  Chart creation errors have been sought, but may not always  have been located. Such creation errors do not reflect on  the standard of medical care.

## 2022-11-02 ENCOUNTER — Ambulatory Visit: Payer: Medicare Other | Attending: Physician Assistant | Admitting: Physician Assistant

## 2022-11-02 ENCOUNTER — Encounter: Payer: Self-pay | Admitting: Physician Assistant

## 2022-11-02 VITALS — BP 109/75 | HR 80 | Resp 15 | Ht 59.0 in | Wt 138.8 lb

## 2022-11-02 DIAGNOSIS — M25511 Pain in right shoulder: Secondary | ICD-10-CM | POA: Insufficient documentation

## 2022-11-02 DIAGNOSIS — R768 Other specified abnormal immunological findings in serum: Secondary | ICD-10-CM | POA: Insufficient documentation

## 2022-11-02 DIAGNOSIS — Z96651 Presence of right artificial knee joint: Secondary | ICD-10-CM | POA: Diagnosis not present

## 2022-11-02 DIAGNOSIS — M2550 Pain in unspecified joint: Secondary | ICD-10-CM | POA: Insufficient documentation

## 2022-11-02 DIAGNOSIS — S99911D Unspecified injury of right ankle, subsequent encounter: Secondary | ICD-10-CM | POA: Diagnosis not present

## 2022-11-02 DIAGNOSIS — Z8659 Personal history of other mental and behavioral disorders: Secondary | ICD-10-CM | POA: Diagnosis not present

## 2022-11-02 DIAGNOSIS — M25562 Pain in left knee: Secondary | ICD-10-CM | POA: Diagnosis not present

## 2022-11-02 DIAGNOSIS — R760 Raised antibody titer: Secondary | ICD-10-CM | POA: Diagnosis not present

## 2022-11-02 DIAGNOSIS — G8929 Other chronic pain: Secondary | ICD-10-CM | POA: Diagnosis not present

## 2022-11-02 DIAGNOSIS — G4709 Other insomnia: Secondary | ICD-10-CM | POA: Diagnosis not present

## 2022-11-02 DIAGNOSIS — Z8639 Personal history of other endocrine, nutritional and metabolic disease: Secondary | ICD-10-CM | POA: Insufficient documentation

## 2022-11-02 DIAGNOSIS — M25561 Pain in right knee: Secondary | ICD-10-CM | POA: Diagnosis not present

## 2022-11-02 DIAGNOSIS — M7062 Trochanteric bursitis, left hip: Secondary | ICD-10-CM | POA: Insufficient documentation

## 2022-11-02 DIAGNOSIS — M25512 Pain in left shoulder: Secondary | ICD-10-CM | POA: Insufficient documentation

## 2022-11-02 NOTE — Addendum Note (Signed)
Addended by: Ellen Henri on: 11/02/2022 04:43 PM   Modules accepted: Orders

## 2022-11-03 ENCOUNTER — Telehealth: Payer: Self-pay | Admitting: Physician Assistant

## 2022-11-03 NOTE — Telephone Encounter (Signed)
Referral refaxed, pending appt

## 2022-11-03 NOTE — Telephone Encounter (Signed)
Call from Dr Walker Kehr office claiming they did not receive the full referral with office notes and demographics. Only received two pages. Please resend.

## 2022-11-04 IMAGING — MG MM DIGITAL SCREENING BILAT W/ TOMO AND CAD
8 series · 8 of 24 positions shown · non-contrast
Comparison: Previous exam(s).

CLINICAL DATA: Screening.

EXAM:
DIGITAL SCREENING BILATERAL MAMMOGRAM WITH TOMOSYNTHESIS AND CAD
TECHNIQUE: Bilateral screening digital craniocaudal and mediolateral oblique
mammograms were obtained. Bilateral screening digital breast
tomosynthesis was performed. The images were evaluated with
computer-aided detection.

[R CC synth-2D]
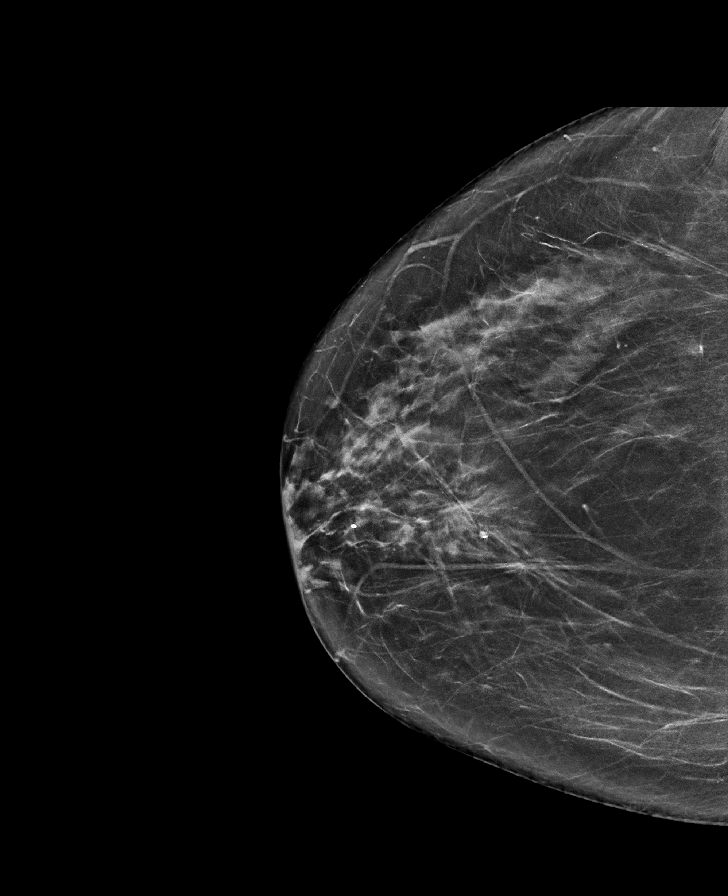

[L MLO synth-2D]
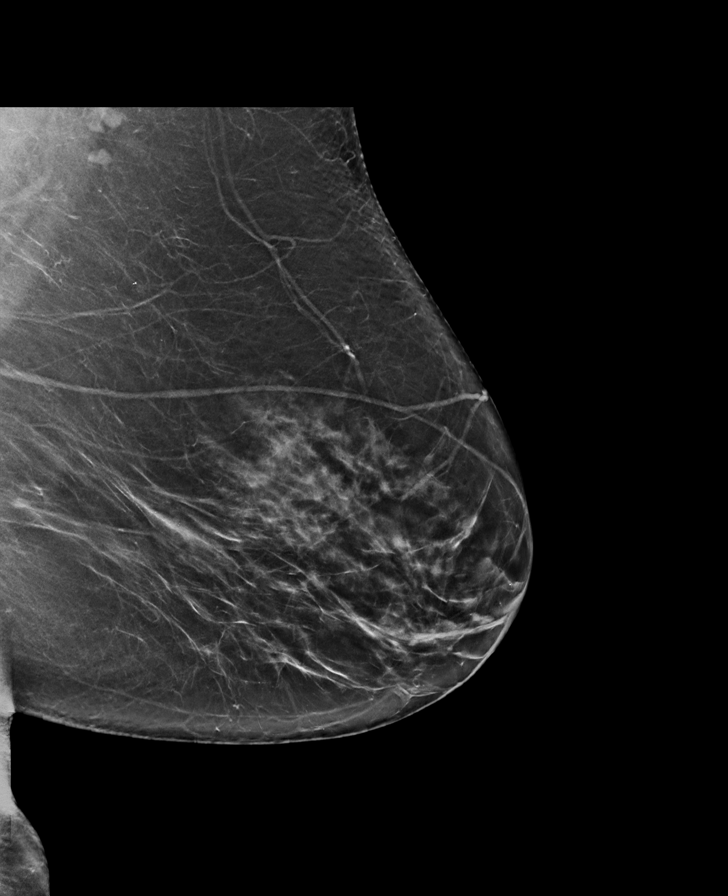

[R MLO synth-2D]
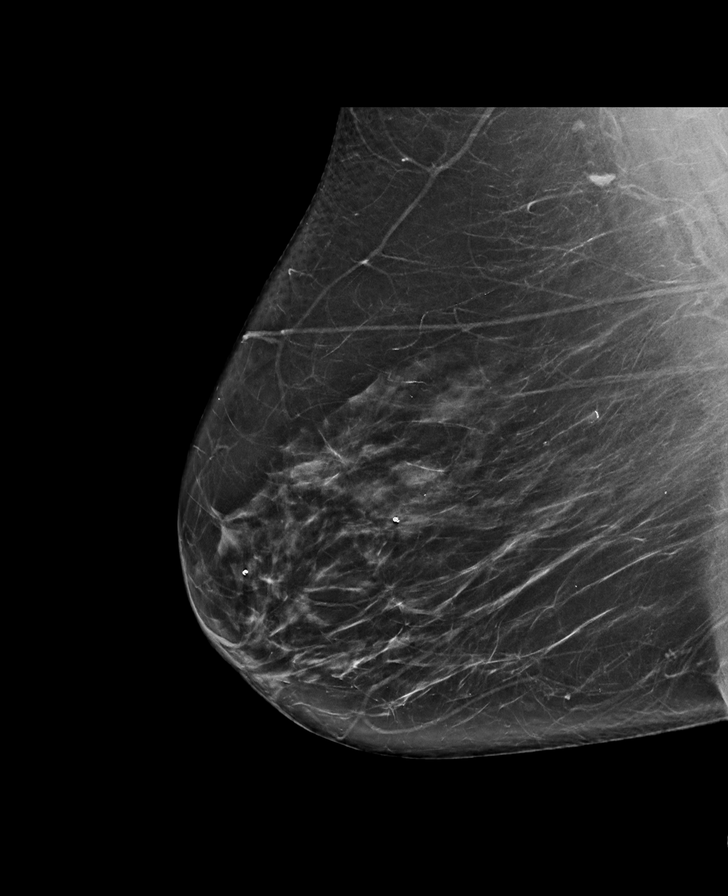

[L CC synth-2D]
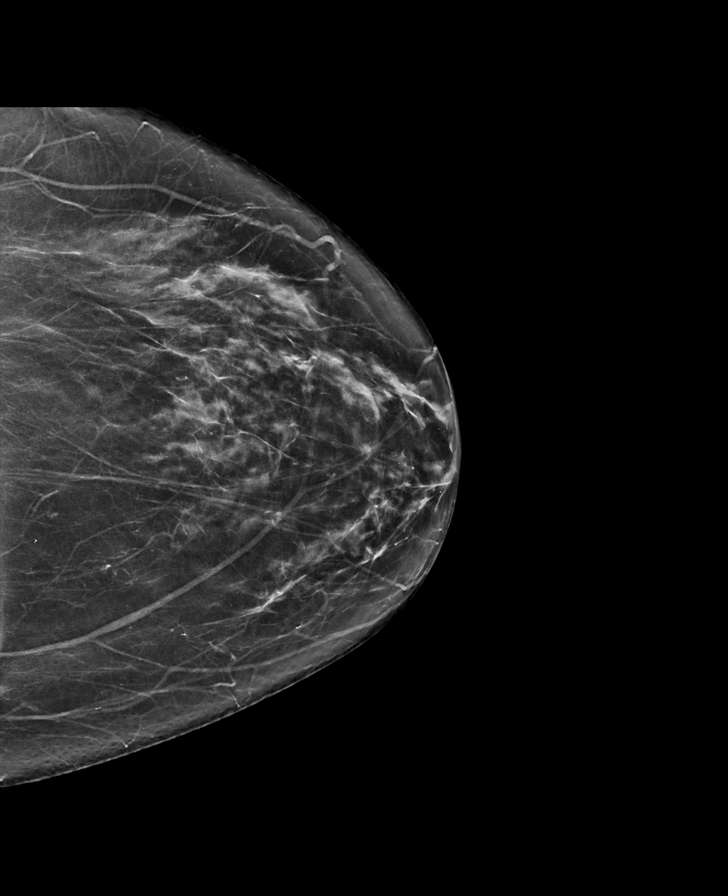

[R MLO tomo · tomo slice 40/79.0]
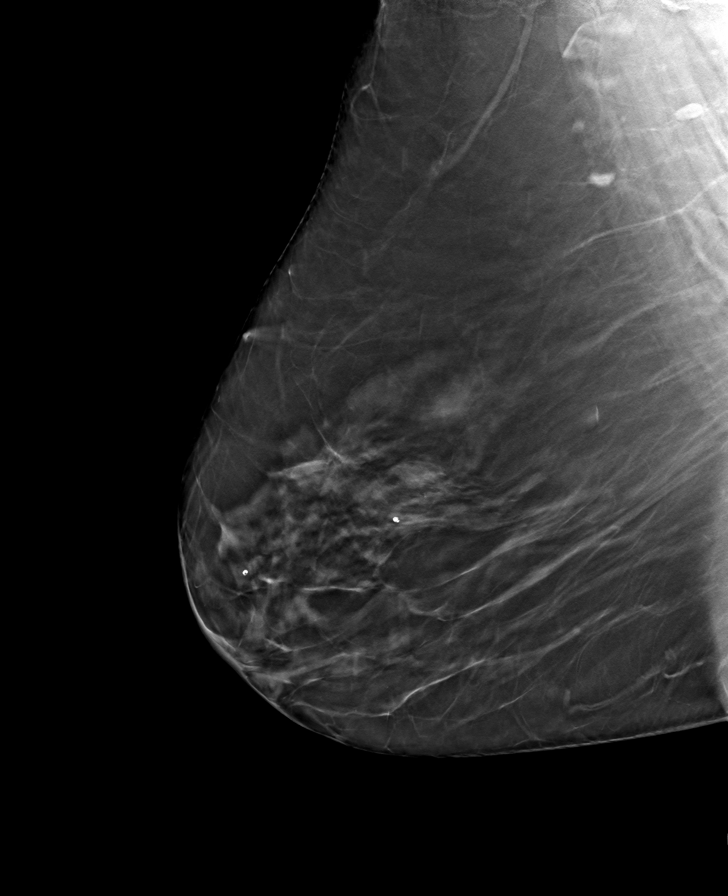

[R CC tomo · tomo slice 35/70.0]
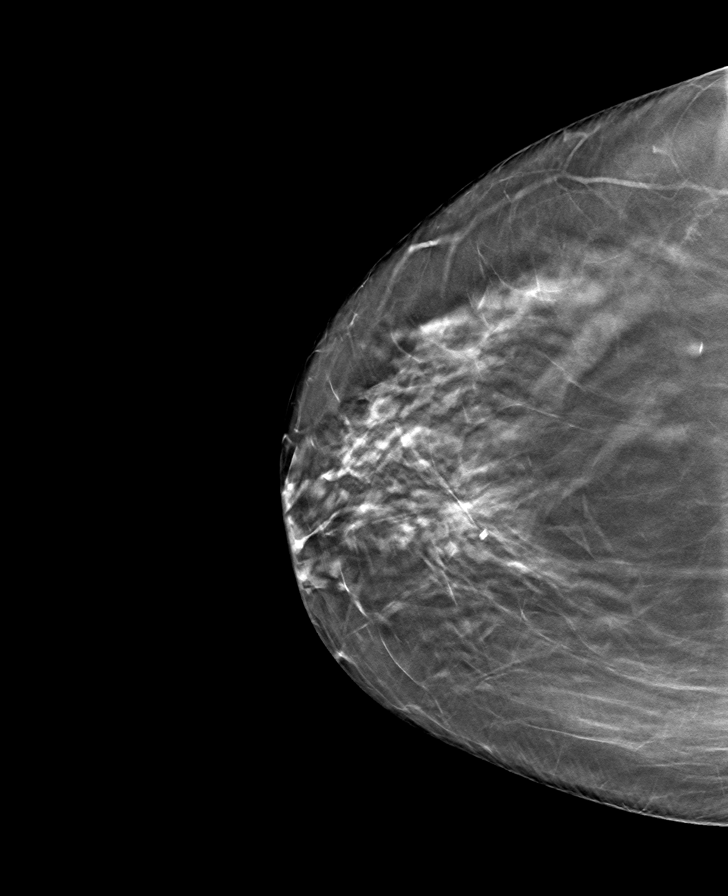

[L MLO tomo · tomo slice 39/76.0]
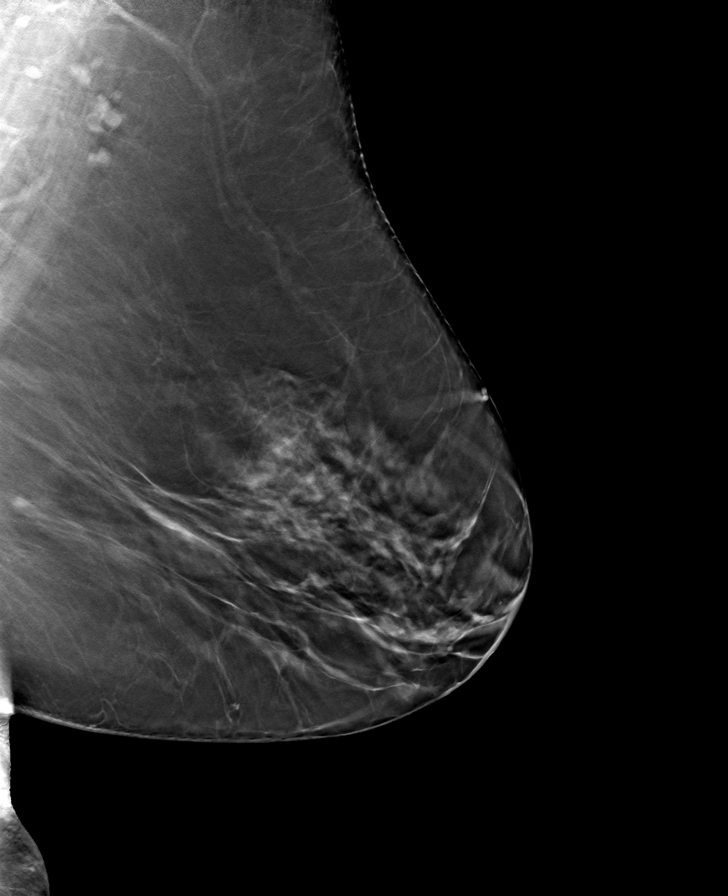

[L CC tomo · tomo slice 35/69.0]
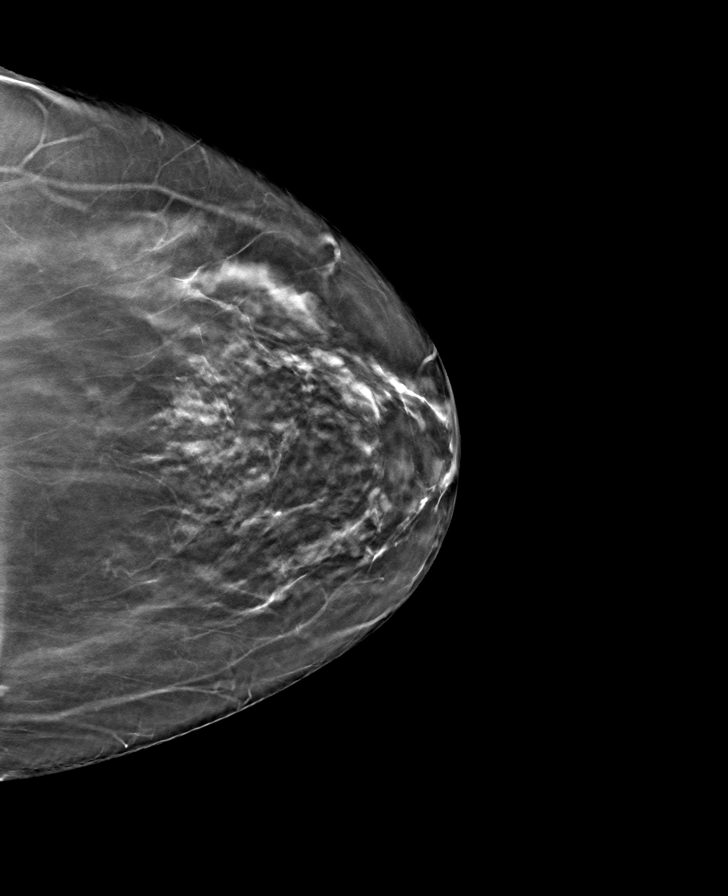

[8 of 24 positions shown; findings below may reference images not displayed]

ACR Breast Density Category b: There are scattered areas of
fibroglandular density.
FINDINGS: There are no findings suspicious for malignancy.
IMPRESSION: No mammographic evidence of malignancy. A result letter of this
screening mammogram will be mailed directly to the patient.

RECOMMENDATION:
Screening mammogram in one year. (Code:51-O-LD2)

BI-RADS CATEGORY  1: Negative.

## 2022-11-05 ENCOUNTER — Telehealth: Payer: Self-pay | Admitting: Rheumatology

## 2022-11-05 NOTE — Telephone Encounter (Signed)
Morgan Reed from [facility] called stating Dr. Lorrine Kin reviewed the referral and declined it.  If you have any questions, please call back at #[phone]

## 2022-11-05 NOTE — Telephone Encounter (Signed)
I called patient, referral changed to [facility], pending appt.

## 2022-11-09 NOTE — Progress Notes (Signed)
ANA remains positive--very low titer.  Creatinine is slightly elevated-1.05 and GFR is low-55.  Avoid the use of NSAIDs. Rest of CMP WNL.   Lupus anticoagulant was detected.   CBC WNL. dsDNA is negative.  RF and anti-CCP negative.   ESR and CRP Wnl Complements WNL.   Protein creatinine ratio WNL

## 2022-11-15 ENCOUNTER — Encounter: Payer: Self-pay | Admitting: Physical Medicine and Rehabilitation

## 2022-11-18 LAB — COMPLETE METABOLIC PANEL WITH GFR
AG Ratio: 2.2 (calc) (ref 1.0–2.5)
ALT: 16 U/L (ref 6–29)
AST: 17 U/L (ref 10–35)
Albumin: 4.6 g/dL (ref 3.6–5.1)
Alkaline phosphatase (APISO): 70 U/L (ref 37–153)
BUN/Creatinine Ratio: 26 (calc) — ABNORMAL HIGH (ref 6–22)
BUN: 27 mg/dL — ABNORMAL HIGH (ref 7–25)
CO2: 23 mmol/L (ref 20–32)
Calcium: 10 mg/dL (ref 8.6–10.4)
Chloride: 107 mmol/L (ref 98–110)
Creat: 1.05 mg/dL — ABNORMAL HIGH (ref 0.60–1.00)
Globulin: 2.1 g/dL (ref 1.9–3.7)
Glucose, Bld: 96 mg/dL (ref 65–99)
Potassium: 4.4 mmol/L (ref 3.5–5.3)
Sodium: 140 mmol/L (ref 135–146)
Total Bilirubin: 0.3 mg/dL (ref 0.2–1.2)
Total Protein: 6.7 g/dL (ref 6.1–8.1)
eGFR: 55 mL/min/{1.73_m2} — ABNORMAL LOW (ref >=60)

## 2022-11-18 LAB — CBC WITH DIFFERENTIAL/PLATELET
Absolute Monocytes: 662 {cells}/uL (ref 200–950)
Basophils Absolute: 62 {cells}/uL (ref 0–200)
Basophils Relative: 0.8 %
Eosinophils Absolute: 77 {cells}/uL (ref 15–500)
Eosinophils Relative: 1 %
HCT: 39.6 % (ref 35.0–45.0)
Hemoglobin: 13 g/dL (ref 11.7–15.5)
Lymphs Abs: 1794 {cells}/uL (ref 850–3900)
MCH: 30.2 pg (ref 27.0–33.0)
MCHC: 32.8 g/dL (ref 32.0–36.0)
MCV: 92.1 fL (ref 80.0–100.0)
MPV: 11.5 fL (ref 7.5–12.5)
Monocytes Relative: 8.6 %
Neutro Abs: 5105 {cells}/uL (ref 1500–7800)
Neutrophils Relative %: 66.3 %
Platelets: 301 10*3/uL (ref 140–400)
RBC: 4.3 10*6/uL (ref 3.80–5.10)
RDW: 11.5 % (ref 11.0–15.0)
Total Lymphocyte: 23.3 %
WBC: 7.7 10*3/uL (ref 3.8–10.8)

## 2022-11-18 LAB — RFLX HEXAGONAL PHASE CONFIRM: Hexagonal Phase Conf: POSITIVE — AB

## 2022-11-18 LAB — THROMBIN CLOTTING TIME: Thrombin Clotting Time: 17 s (ref 13–19)

## 2022-11-18 LAB — PROTEIN / CREATININE RATIO, URINE
Creatinine, Urine: 94 mg/dL (ref 20–275)
Protein/Creat Ratio: 53 mg/g{creat} (ref 24–184)
Protein/Creatinine Ratio: 0.053 mg/mg{creat} (ref 0.024–0.184)
Total Protein, Urine: 5 mg/dL (ref 5–24)

## 2022-11-18 LAB — LUPUS ANTICOAGULANT EVAL W/ REFLEX
PTT-LA Screen: 56 s — ABNORMAL HIGH (ref <=40)
dRVVT: 71 s — ABNORMAL HIGH (ref <=45)

## 2022-11-18 LAB — RFLX DRVVT CONFRIM: DRVVT CONFIRM: POSITIVE — AB

## 2022-11-18 LAB — RFX DRVVT 1:1 MIX: PT, Mixing Interp: POSITIVE — AB

## 2022-11-18 LAB — CARDIOLIPIN ANTIBODIES, IGG, IGM, IGA
Anticardiolipin IgA: <2 [APL'U]/mL (ref <20.0)
Anticardiolipin IgG: <2 [GPL'U]/mL (ref <20.0)
Anticardiolipin IgM: 4.6 [MPL'U]/mL (ref <20.0)

## 2022-11-18 LAB — CYCLIC CITRUL PEPTIDE ANTIBODY, IGG: Cyclic Citrullin Peptide Ab: <16 U

## 2022-11-18 LAB — C3 AND C4
C3 Complement: 123 mg/dL (ref 83–193)
C4 Complement: 26 mg/dL (ref 15–57)

## 2022-11-18 LAB — ANA: Anti Nuclear Antibody (ANA): POSITIVE — AB

## 2022-11-18 LAB — ANTI-NUCLEAR AB-TITER (ANA TITER): ANA Titer 1: 1:40 {titer} — ABNORMAL HIGH

## 2022-11-18 LAB — BETA-2 GLYCOPROTEIN ANTIBODIES
Beta-2 Glyco 1 IgA: <2 U/mL (ref <20.0)
Beta-2 Glyco 1 IgM: 4.7 U/mL (ref <20.0)
Beta-2 Glyco I IgG: <2 U/mL (ref <20.0)

## 2022-11-18 LAB — C-REACTIVE PROTEIN: CRP: <3 mg/L (ref <8.0)

## 2022-11-18 LAB — SEDIMENTATION RATE: Sed Rate: 9 mm/h (ref 0–30)

## 2022-11-18 LAB — RHEUMATOID FACTOR: Rheumatoid fact SerPl-aCnc: <10 [IU]/mL (ref <14)

## 2022-11-18 LAB — ANTI-DNA ANTIBODY, DOUBLE-STRANDED: ds DNA Ab: <1 [IU]/mL

## 2022-11-18 NOTE — Progress Notes (Signed)
Lupus anticoagulant was detected but anticardiolipin antibodies and beta-2 glycoprotein antibodies were negative.   Continue taking aspirin

## 2022-11-19 DIAGNOSIS — Z23 Encounter for immunization: Secondary | ICD-10-CM | POA: Diagnosis not present

## 2022-12-06 ENCOUNTER — Other Ambulatory Visit: Payer: Self-pay

## 2022-12-06 ENCOUNTER — Encounter (HOSPITAL_BASED_OUTPATIENT_CLINIC_OR_DEPARTMENT_OTHER): Payer: Self-pay | Admitting: Physical Therapy

## 2022-12-06 ENCOUNTER — Ambulatory Visit (HOSPITAL_BASED_OUTPATIENT_CLINIC_OR_DEPARTMENT_OTHER): Payer: Medicare Other | Attending: Physician Assistant | Admitting: Physical Therapy

## 2022-12-06 DIAGNOSIS — R262 Difficulty in walking, not elsewhere classified: Secondary | ICD-10-CM | POA: Diagnosis not present

## 2022-12-06 DIAGNOSIS — M6281 Muscle weakness (generalized): Secondary | ICD-10-CM | POA: Insufficient documentation

## 2022-12-06 DIAGNOSIS — M25512 Pain in left shoulder: Secondary | ICD-10-CM | POA: Diagnosis not present

## 2022-12-06 DIAGNOSIS — M7062 Trochanteric bursitis, left hip: Secondary | ICD-10-CM | POA: Diagnosis not present

## 2022-12-06 DIAGNOSIS — M25562 Pain in left knee: Secondary | ICD-10-CM | POA: Diagnosis not present

## 2022-12-06 DIAGNOSIS — M25511 Pain in right shoulder: Secondary | ICD-10-CM | POA: Diagnosis not present

## 2022-12-06 DIAGNOSIS — M25561 Pain in right knee: Secondary | ICD-10-CM | POA: Diagnosis not present

## 2022-12-06 DIAGNOSIS — G8929 Other chronic pain: Secondary | ICD-10-CM | POA: Diagnosis not present

## 2022-12-06 NOTE — Therapy (Signed)
OUTPATIENT PHYSICAL THERAPY THORACOLUMBAR EVALUATION   Patient Name: Morgan Reed MRN: [id number] DOB:01-Mar-1946, 77 y.o., female Today's Date: 12/06/2022  END OF SESSION:  PT End of Session - 12/06/22 1249     Visit Number 1    Number of Visits 12    Date for PT Re-Evaluation 01/28/23    Authorization Type mcr    Progress Note Due on Visit 10    PT Start Time 1210    PT Stop Time 1245    PT Time Calculation (min) 35 min    Activity Tolerance Patient tolerated treatment well    Behavior During Therapy WFL for tasks assessed/performed             Past Medical History:  Diagnosis Date   Arthritis    knees, hands   Past Surgical History:  Procedure Laterality Date   ABDOMINAL HYSTERECTOMY  1995   complication with bowel tear   BREAST BIOPSY     right breast benign approx 1990   BREAST EXCISIONAL BIOPSY Right    right breast benign 1990   BUNIONECTOMY     EYE SURGERY Bilateral 2014   REPLACEMENT TOTAL KNEE Right 2013   TONSILLECTOMY     as a child   TOTAL KNEE REVISION Right 12/01/2020   Procedure: RIGHT TOTAL KNEE REVISION;  Surgeon: Gean Birchwood, MD;  Location: [facility];  Service: Orthopedics;  Laterality: Right;   Patient Active Problem List   Diagnosis Date Noted   Chronic pain of both shoulders 07/21/2022   DJD of both shoulders 07/21/2022   S/P revision of total knee, 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   Lupus anticoagulant positive 11/07/2020   Osteoarthritis 10/31/2020   Hyperlipidemia 10/31/2020   Obesity, Class I, BMI 30-34.9 10/31/2020   Osteopenia 10/31/2020   Insomnia 10/31/2020   Abnormal coagulation profile 10/31/2020   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) 10/31/2020    PCP: Wilfrid Lund, PA   REFERRING PROVIDER: Gearldine Bienenstock, PA-C   REFERRING DIAG:   Trochanteric bursitis, left hip  M25.561,M25.562,G89.29 (ICD-10-CM) - Chronic pain of both knees  M25.511,G89.29,M25.512 (ICD-10-CM) - Chronic pain of both shoulders    Rationale  for Evaluation and Treatment: Rehabilitation  THERAPY DIAG:  Chronic pain of both shoulders  Chronic pain of both knees  Difficulty in walking, not elsewhere classified  Muscle weakness (generalized)  ONSET DATE: multiped years  SUBJECTIVE:                                                                                                                                                                                           SUBJECTIVE STATEMENT: Main  problem is the pain in my shoulders. Doctors say they need to be replaced.  I feel so weak and tired they keep telling me my tests are just hints of Lupus but I have all the symptoms.  Had the PRP but only worked a few weeks in the shoulder. I was sick recently for 2 weeks had covid. I have arthritis everywhere. It takes me a couple hours to get going in the morning. I go to [employer] 3 x week with Erskine Squibb and the advanced arthritis program. Thought heated pool would help.  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 Fundamentally she needs shoulder replacement. She would like to avoid surgery and is looking for some other options. She had steroid injections about 6 weeks ago at [facility] orthopedics which only lasted a week or 2.   Rheumatologist: positive ANA and lupus anticoagulant positive   PAIN:  Are you having pain? Yes: NPRS scale: current 0/10; walking 3/10 Pain location: left hip/knees Pain description: sharp intermittent Aggravating factors: walking <10 minutes Relieving factors: heating pad  Shoulder: current 7/10; least 4/10 pain is sharp constant   PRECAUTIONS: Shoulder and Fall  RED FLAGS: None   WEIGHT BEARING RESTRICTIONS: No  FALLS:  Has patient fallen in last 6 months? No  LIVING ENVIRONMENT: Lives with: lives alone Lives in: House/apartment Stairs: No Has following equipment at home: None  OCCUPATION: retired  PLOF: Independent  PATIENT GOALS:  decrease pain, increase energy  NEXT MD VISIT: oct  OBJECTIVE:   DIAGNOSTIC FINDINGS:  None recent in chart  PATIENT SURVEYS:  Foto   COGNITION: Overall cognitive status: Within functional limits for tasks assessed     SENSATION: WFL   POSTURE: rounded shoulders, forward head, increased thoracic kyphosis, and flexed trunk   PALPATION: TTP and muscle tightness throughout cervical spine and upper/middle traps    UPPER EXTREMITY ROM:  Active ROM Right eval Left eval  Shoulder flexion 90 45  Shoulder extension    Shoulder abduction 90 60  Shoulder adduction    Shoulder extension Pt refused due to pain Pt refused due to pain  Shoulder internal rotation FT to hips FT to hips  Shoulder external rotation Hand to shoulder Hand to shoulder  Elbow flexion    Elbow extension    Wrist flexion    Wrist extension    Wrist ulnar deviation    Wrist radial deviation    Wrist pronation    Wrist supination     (Blank rows = not tested)   LUMBAR ROM:   AROM eval  Flexion FT below patella  Extension neutral  Right lateral flexion 25%  Left lateral flexion 25%  Right rotation   Left rotation    (Blank rows = not tested)  LOWER EXTREMITY ROM:     Knee ROM WFL Left hip flex 100d  LOWER EXTREMITY MMT:    MMT Right eval Left eval  Hip flexion 4- 3+  Hip extension    Hip abduction 4- 4-  Hip adduction 4- 4-  Hip internal rotation    Hip external rotation    Knee flexion 4- 4-  Knee extension 4 4  Ankle dorsiflexion 5 5  Ankle plantarflexion 5 5  Ankle inversion    Ankle eversion     (Blank rows = not tested)    FUNCTIONAL TESTS:  5 times sit to stand: 17.31 Timed up and go (TUG): 19.55 4 stage: Passed stage 1&2  Stage 3: tandem x 4s Stage 4: SLS x3s  GAIT: Distance walked: 400 ft Assistive device utilized: None Level of assistance: Complete Independence Comments: no arm swing, cadence slowed, shortened step length, posture guarded, of loading  towards right  TODAY'S TREATMENT:                                                                                                                              Eval Funtional  and objective testing   PATIENT EDUCATION:  Education details: Discussed eval findings, rehab rationale, aquatic program progression/POC and pools in area. Patient is in agreement  Person educated: Patient Education method: Explanation Education comprehension: verbalized understanding  HOME EXERCISE PROGRAM: Pt goes to water aerobic (Less pain with Erskine Squibb) at [facility] x 3 weekly  ASSESSMENT:  CLINICAL IMPRESSION: Patient is a 77 y.o. f who was seen today for physical therapy evaluation and treatment for Joint pain left hip, shoulders, and knees. 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 She presents today with main complaint of shoulder pain.  She had PRP which did not help.  She is unable to wash under her arms without a brush.  She reports she has all over body pain daily all day and is just recovering from a 2 week stint with covid, feeling very week. She does live alone. She will benefit from skilled physical therapy to improve ROM and strength of extremities, decrease pain and Improve her functional  mobility and ADL's.  Will initially see her in aquatics to return her to her PLOF prior to covid then transition to land based intervention. She was going to the [facility] 3 x week for water exercise when feeling well.   She has not yet decided on having shoulder surgery.  OBJECTIVE IMPAIRMENTS: Abnormal gait, decreased activity tolerance, decreased balance, decreased mobility, decreased ROM, decreased strength, impaired flexibility, and pain.   ACTIVITY LIMITATIONS: carrying, lifting, bending, sitting, standing, squatting, stairs, bathing, dressing, reach over head, and hygiene/grooming  PARTICIPATION LIMITATIONS: cleaning,  laundry, shopping, and community activity  PERSONAL FACTORS: Age and Time since onset of injury/illness/exacerbation are also affecting patient's functional outcome.   REHAB POTENTIAL: Good  CLINICAL DECISION MAKING: Unstable/unpredictable  EVALUATION COMPLEXITY: High   GOALS: Goals reviewed with patient? Yes  SHORT TERM GOALS: Target date: 01/05/23  Pt will tolerate full aquatic sessions consistently without increase in pain and with improving function to demonstrate good toleration and effectiveness of intervention.  Baseline: Goal status: INITIAL  2.  Pt will amb to and from setting along with toleration full aquatic session to demonstrate progressing toleration to activity Baseline:  Goal status: INITIAL  3.  Pt will report return to grocery shopping Baseline: pick up or delivery Goal status: INITIAL  4.  Pt will complete tandem and SLS submerged x 20s unsupported Baseline:land based 3-4s  Goal status: INITIAL  5.  Pt will improve on  5 X STS test to <or= 15s to demonstrate improving functional lower extremity strength, transitional movements, and balance Baseline: 19.55 Goal status: INITIAL    LONG TERM GOALS: Target date: 01/28/23  Pt to meet stated Foto Goal Baseline:  Goal status: INITIAL  2.  Pt will be able to wash under arms (personal goal) without brush Baseline: unable without brush Goal status: INITIAL  3.  Pt will improve left shoulder Flex and abd towards 90d for improved ability with ADLs Baseline:  Goal status: INITIAL  4.  Pt will improve left hip strength to contralateral side to improve gait pattern Baseline:  Goal status: INITIAL  5.  Pt will return to water aerobics at [facility] 3 x week Baseline: not going Goal status: INITIAL  6.  Pt will be indep with final HEP's (land and aquatic as appropriate) for continued management of condition Baseline:  Goal status: INITIAL  PLAN:  PT FREQUENCY: 1-2x/week  PT DURATION: 8 weeks  PLANNED  INTERVENTIONS: Therapeutic exercises, Therapeutic activity, Neuromuscular re-education, Balance training, Gait training, Patient/Family education, Self Care, Joint mobilization, Joint manipulation, Stair training, Orthotic/Fit training, DME instructions, Aquatic Therapy, Dry Needling, Electrical stimulation, Cryotherapy, Moist heat, scar mobilization, Splintting, Taping, Ionotophoresis 4mg /ml Dexamethasone, Manual therapy, and Re-evaluation.  PLAN FOR NEXT SESSION: Aquatics pain relief, gentle movement/ROM and stretching LE, core and shoulders.   Geni Bers, PT 12/06/2022, 7:33 PM

## 2022-12-14 ENCOUNTER — Ambulatory Visit (HOSPITAL_BASED_OUTPATIENT_CLINIC_OR_DEPARTMENT_OTHER): Payer: Medicare Other | Attending: Physician Assistant | Admitting: Physical Therapy

## 2022-12-14 ENCOUNTER — Encounter (HOSPITAL_BASED_OUTPATIENT_CLINIC_OR_DEPARTMENT_OTHER): Payer: Self-pay | Admitting: Physical Therapy

## 2022-12-14 DIAGNOSIS — M25562 Pain in left knee: Secondary | ICD-10-CM | POA: Diagnosis not present

## 2022-12-14 DIAGNOSIS — G8929 Other chronic pain: Secondary | ICD-10-CM

## 2022-12-14 DIAGNOSIS — R262 Difficulty in walking, not elsewhere classified: Secondary | ICD-10-CM | POA: Diagnosis not present

## 2022-12-14 DIAGNOSIS — M25561 Pain in right knee: Secondary | ICD-10-CM | POA: Insufficient documentation

## 2022-12-14 DIAGNOSIS — M6281 Muscle weakness (generalized): Secondary | ICD-10-CM

## 2022-12-14 DIAGNOSIS — M25512 Pain in left shoulder: Secondary | ICD-10-CM | POA: Diagnosis not present

## 2022-12-14 DIAGNOSIS — M25511 Pain in right shoulder: Secondary | ICD-10-CM | POA: Insufficient documentation

## 2022-12-14 NOTE — Therapy (Signed)
OUTPATIENT PHYSICAL THERAPY THORACOLUMBAR TREATMENT   Patient Name: Morgan Reed MRN: [id number] DOB:[birth year], 77 y.o., female Today's Date: 12/14/2022  END OF SESSION:  PT End of Session - 12/14/22 1201     Visit Number 2    Number of Visits 12    Date for PT Re-Evaluation 01/28/23    Authorization Type MCR    Progress Note Due on Visit 10    PT Start Time 1200    PT Stop Time 1240    PT Time Calculation (min) 40 min    Activity Tolerance Patient tolerated treatment well    Behavior During Therapy WFL for tasks assessed/performed             Past Medical History:  Diagnosis Date   Arthritis    knees, hands   Past Surgical History:  Procedure Laterality Date   ABDOMINAL HYSTERECTOMY  1995   complication with bowel tear   BREAST BIOPSY     right breast benign approx 1990   BREAST EXCISIONAL BIOPSY Right    right breast benign 1990   BUNIONECTOMY     EYE SURGERY Bilateral 2014   REPLACEMENT TOTAL KNEE Right 2013   TONSILLECTOMY     as a child   TOTAL KNEE REVISION Right 12/01/2020   Procedure: RIGHT TOTAL KNEE REVISION;  Surgeon: Gean Birchwood, MD;  Location: [facility];  Service: Orthopedics;  Laterality: Right;   Patient Active Problem List   Diagnosis Date Noted   Chronic pain of both shoulders 07/21/2022   DJD of both shoulders 07/21/2022   S/P revision of total knee, 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   Lupus anticoagulant positive 11/07/2020   Osteoarthritis 10/31/2020   Hyperlipidemia 10/31/2020   Obesity, Class I, BMI 30-34.9 10/31/2020   Osteopenia 10/31/2020   Insomnia 10/31/2020   Abnormal coagulation profile 10/31/2020   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) 10/31/2020    PCP: Wilfrid Lund, PA   REFERRING PROVIDER: Gearldine Bienenstock, PA-C   REFERRING DIAG:   Trochanteric bursitis, left hip  M25.561,M25.562,G89.29 (ICD-10-CM) - Chronic pain of both knees  M25.511,G89.29,M25.512 (ICD-10-CM) - Chronic pain of both shoulders    Rationale for  Evaluation and Treatment: Rehabilitation  THERAPY DIAG:  Chronic pain of both shoulders  Chronic pain of both knees  Difficulty in walking, not elsewhere classified  Muscle weakness (generalized)  ONSET DATE: multiped years  SUBJECTIVE:                                                                                                                                                                                           SUBJECTIVE STATEMENT: Pt  reports that she was sick for 3 weeks; first the flu and then covid.  She said she turned a corner on Monday and is regaining her strength. She hasn't been to [facility] exercise class for 3 weeks.   From initial evaluation:  Main problem is the pain in my shoulders. Doctors say they need to be replaced.  I feel so weak and tired they keep telling me my tests are just hints of Lupus but I have all the symptoms.  Had the PRP but only worked a few weeks in the shoulder. I was sick recently for 2 weeks had covid. I have arthritis everywhere. It takes me a couple hours to get going in the morning. I go to [employer] 3 x week with Erskine Squibb and the advanced arthritis program. Thought heated pool would help.  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 Fundamentally she needs shoulder replacement. She would like to avoid surgery and is looking for some other options. She had steroid injections about 6 weeks ago at [facility] orthopedics which only lasted a week or 2.   Rheumatologist: positive ANA and lupus anticoagulant positive   PAIN:  Are you having pain? Yes: NPRS scale: current 4/10 Pain location: left hip/knees Pain description: sharp intermittent Aggravating factors: walking <10 minutes Relieving factors: heating pad  Shoulder: current 5/10   PRECAUTIONS: Shoulder and Fall  RED FLAGS: None   WEIGHT BEARING RESTRICTIONS: No  FALLS:  Has patient fallen in last 6 months? No  LIVING  ENVIRONMENT: Lives with: lives alone Lives in: House/apartment Stairs: No Has following equipment at home: None  OCCUPATION: retired  PLOF: Independent  PATIENT GOALS: decrease pain, increase energy  NEXT MD VISIT: oct  OBJECTIVE:   DIAGNOSTIC FINDINGS:  None recent in chart  PATIENT SURVEYS:  Foto   COGNITION: Overall cognitive status: Within functional limits for tasks assessed     SENSATION: WFL   POSTURE: rounded shoulders, forward head, increased thoracic kyphosis, and flexed trunk   PALPATION: TTP and muscle tightness throughout cervical spine and upper/middle traps    UPPER EXTREMITY ROM:  Active ROM Right eval Left eval  Shoulder flexion 90 45  Shoulder extension    Shoulder abduction 90 60  Shoulder adduction    Shoulder extension Pt refused due to pain Pt refused due to pain  Shoulder internal rotation FT to hips FT to hips  Shoulder external rotation Hand to shoulder Hand to shoulder  Elbow flexion    Elbow extension    Wrist flexion    Wrist extension    Wrist ulnar deviation    Wrist radial deviation    Wrist pronation    Wrist supination     (Blank rows = not tested)   LUMBAR ROM:   AROM eval  Flexion FT below patella  Extension neutral  Right lateral flexion 25%  Left lateral flexion 25%  Right rotation   Left rotation    (Blank rows = not tested)  LOWER EXTREMITY ROM:     Knee ROM WFL Left hip flex 100d  LOWER EXTREMITY MMT:    MMT Right eval Left eval  Hip flexion 4- 3+  Hip extension    Hip abduction 4- 4-  Hip adduction 4- 4-  Hip internal rotation    Hip external rotation    Knee flexion 4- 4-  Knee extension 4 4  Ankle dorsiflexion 5 5  Ankle plantarflexion 5 5  Ankle  inversion    Ankle eversion     (Blank rows = not tested)    FUNCTIONAL TESTS:  5 times sit to stand: 17.31 Timed up and go (TUG): 19.55 4 stage: Passed stage 1&2  Stage 3: tandem x 4s Stage 4: SLS x3s  GAIT: Distance walked: 400  ft Assistive device utilized: None Level of assistance: Complete Independence Comments: no arm swing, cadence slowed, shortened step length, posture guarded, of loading towards right  TODAY'S TREATMENT:                                                                                                                              Pt seen for aquatic therapy today.  Treatment took place in water 3.5-4.75 ft in depth at the [facility] pool. Temp of water was 91.  Pt entered/exited the pool via stairs in step-to pattern independently with bilat rail.   * unsupported - walking forward / backward with reciprocal arm swing;  side stepping with arm addct/ abdct (low range and hand in anatomical position) * bilat shoulder ext/flex (to tolerance) ; elbow flex/ ext  * bilat shoulder IR/ER - 1 rep, stopped due to pain up to 7/10 in Lt shoulder  * marching with arms relaxed at side * holding wall with RUE : hip abdct/ addct x 10 * rainbow hand floats under arms (armpits as done at [facility]): cycling, cross country ski, suspended jumping jacks  Pt requires the buoyancy and hydrostatic pressure of water for support, and to offload joints by unweighting joint load by at least 50 % in navel deep water and by at least 75-80% in chest to neck deep water.  Viscosity of the water is needed for resistance of strengthening. Water current perturbations provides challenge to standing balance requiring increased core activation.     PATIENT EDUCATION:  Education details: intro to aquatic therapy  Person educated: Patient Education method: Explanation Education comprehension: verbalized understanding  HOME EXERCISE PROGRAM: Pt goes to water aerobic (Less pain with Erskine Squibb) at [facility] x 3 weekly  ASSESSMENT:  CLINICAL IMPRESSION: Pt demonstrated limited shoulder ROM in water; especially painful in Lt shoulder in ER and extension.  Reviewed some of the exercises for aquatic therapy and suggested she let arms rest  at sides vs folded across abdomen, as well as avoiding the impingement position (thumb down).  Pain remained unchanged in water during session, with episode of spike in pain with Lt shoulder ER.  Will continue to review safe exercises to complete in water and which ones to avoid.  Goals are ongoing.     From initial evaluation: Patient is a 77 y.o. f who was seen today for physical therapy evaluation and treatment for Joint pain left hip, shoulders, and knees. 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 She presents today with main complaint of shoulder pain.  She had PRP which did  not help.  She is unable to wash under her arms without a brush.  She reports she has all over body pain daily all day and is just recovering from a 2 week stint with covid, feeling very week. She does live alone. She will benefit from skilled physical therapy to improve ROM and strength of extremities, decrease pain and Improve her functional  mobility and ADL's.  Will initially see her in aquatics to return her to her PLOF prior to covid then transition to land based intervention. She was going to the [facility] 3 x week for water exercise when feeling well.   She has not yet decided on having shoulder surgery.  OBJECTIVE IMPAIRMENTS: Abnormal gait, decreased activity tolerance, decreased balance, decreased mobility, decreased ROM, decreased strength, impaired flexibility, and pain.   ACTIVITY LIMITATIONS: carrying, lifting, bending, sitting, standing, squatting, stairs, bathing, dressing, reach over head, and hygiene/grooming  PARTICIPATION LIMITATIONS: cleaning, laundry, shopping, and community activity  PERSONAL FACTORS: Age and Time since onset of injury/illness/exacerbation are also affecting patient's functional outcome.   REHAB POTENTIAL: Good  CLINICAL DECISION MAKING: Unstable/unpredictable  EVALUATION COMPLEXITY:  High   GOALS: Goals reviewed with patient? Yes  SHORT TERM GOALS: Target date: 01/05/23  Pt will tolerate full aquatic sessions consistently without increase in pain and with improving function to demonstrate good toleration and effectiveness of intervention.  Baseline: Goal status: INITIAL  2.  Pt will amb to and from setting along with toleration full aquatic session to demonstrate progressing toleration to activity Baseline:  Goal status: INITIAL  3.  Pt will report return to grocery shopping Baseline: pick up or delivery Goal status: INITIAL  4.  Pt will complete tandem and SLS submerged x 20s unsupported Baseline:land based 3-4s  Goal status: INITIAL  5.  Pt will improve on 5 X STS test to <or= 15s to demonstrate improving functional lower extremity strength, transitional movements, and balance Baseline: 19.55 Goal status: INITIAL    LONG TERM GOALS: Target date: 01/28/23  Pt to meet stated Foto Goal Baseline:  Goal status: INITIAL  2.  Pt will be able to wash under arms (personal goal) without brush Baseline: unable without brush Goal status: INITIAL  3.  Pt will improve left shoulder Flex and abd towards 90d for improved ability with ADLs Baseline:  Goal status: INITIAL  4.  Pt will improve left hip strength to contralateral side to improve gait pattern Baseline:  Goal status: INITIAL  5.  Pt will return to water aerobics at [facility] 3 x week Baseline: not going Goal status: INITIAL  6.  Pt will be indep with final HEP's (land and aquatic as appropriate) for continued management of condition Baseline:  Goal status: INITIAL  PLAN:  PT FREQUENCY: 1-2x/week  PT DURATION: 8 weeks  PLANNED INTERVENTIONS: Therapeutic exercises, Therapeutic activity, Neuromuscular re-education, Balance training, Gait training, Patient/Family education, Self Care, Joint mobilization, Joint manipulation, Stair training, Orthotic/Fit training, DME instructions, Aquatic Therapy,  Dry Needling, Electrical stimulation, Cryotherapy, Moist heat, scar mobilization, Splintting, Taping, Ionotophoresis 4mg /ml Dexamethasone, Manual therapy, and Re-evaluation.  PLAN FOR NEXT SESSION: Aquatics pain relief, gentle movement/ROM and stretching LE, core and shoulders.   Mayer Camel, PTA 12/14/22 1:40 PM [facility] [address] [place], [place], [address] Phone: [phone]   Fax:  [phone]

## 2022-12-16 ENCOUNTER — Ambulatory Visit (HOSPITAL_BASED_OUTPATIENT_CLINIC_OR_DEPARTMENT_OTHER): Payer: Medicare Other | Admitting: Physical Therapy

## 2022-12-16 ENCOUNTER — Encounter (HOSPITAL_BASED_OUTPATIENT_CLINIC_OR_DEPARTMENT_OTHER): Payer: Self-pay | Admitting: Physical Therapy

## 2022-12-16 DIAGNOSIS — M25562 Pain in left knee: Secondary | ICD-10-CM | POA: Diagnosis not present

## 2022-12-16 DIAGNOSIS — R262 Difficulty in walking, not elsewhere classified: Secondary | ICD-10-CM

## 2022-12-16 DIAGNOSIS — M6281 Muscle weakness (generalized): Secondary | ICD-10-CM

## 2022-12-16 DIAGNOSIS — G8929 Other chronic pain: Secondary | ICD-10-CM | POA: Diagnosis not present

## 2022-12-16 DIAGNOSIS — M25561 Pain in right knee: Secondary | ICD-10-CM | POA: Diagnosis not present

## 2022-12-16 DIAGNOSIS — M25511 Pain in right shoulder: Secondary | ICD-10-CM | POA: Diagnosis not present

## 2022-12-16 DIAGNOSIS — M25512 Pain in left shoulder: Secondary | ICD-10-CM | POA: Diagnosis not present

## 2022-12-16 NOTE — Therapy (Signed)
OUTPATIENT PHYSICAL THERAPY THORACOLUMBAR TREATMENT   Patient Name: Morgan Reed MRN: [id number] DOB:Jan 10, 1946, 77 y.o., female Today's Date: 12/16/2022  END OF SESSION:  PT End of Session - 12/16/22 1617     Visit Number 3    Number of Visits 12    Date for PT Re-Evaluation 01/28/23    Authorization Type MCR    Progress Note Due on Visit 10    PT Start Time 1615    PT Stop Time 1655    PT Time Calculation (min) 40 min    Activity Tolerance Patient tolerated treatment well    Behavior During Therapy WFL for tasks assessed/performed             Past Medical History:  Diagnosis Date   Arthritis    knees, hands   Past Surgical History:  Procedure Laterality Date   ABDOMINAL HYSTERECTOMY  1995   complication with bowel tear   BREAST BIOPSY     right breast benign approx 1990   BREAST EXCISIONAL BIOPSY Right    right breast benign 1990   BUNIONECTOMY     EYE SURGERY Bilateral 2014   REPLACEMENT TOTAL KNEE Right 2013   TONSILLECTOMY     as a child   TOTAL KNEE REVISION Right 12/01/2020   Procedure: RIGHT TOTAL KNEE REVISION;  Surgeon: Gean Birchwood, MD;  Location: [facility];  Service: Orthopedics;  Laterality: Right;   Patient Active Problem List   Diagnosis Date Noted   Chronic pain of both shoulders 07/21/2022   DJD of both shoulders 07/21/2022   S/P revision of total knee, 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   Lupus anticoagulant positive 11/07/2020   Osteoarthritis 10/31/2020   Hyperlipidemia 10/31/2020   Obesity, Class I, BMI 30-34.9 10/31/2020   Osteopenia 10/31/2020   Insomnia 10/31/2020   Abnormal coagulation profile 10/31/2020   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) 10/31/2020    PCP: Wilfrid Lund, PA   REFERRING PROVIDER: Gearldine Bienenstock, PA-C   REFERRING DIAG:   Trochanteric bursitis, left hip  M25.561,M25.562,G89.29 (ICD-10-CM) - Chronic pain of both knees  M25.511,G89.29,M25.512 (ICD-10-CM) - Chronic pain of both shoulders    Rationale  for Evaluation and Treatment: Rehabilitation  THERAPY DIAG:  Chronic pain of both shoulders  Chronic pain of both knees  Difficulty in walking, not elsewhere classified  Muscle weakness (generalized)  ONSET DATE: multiped years  SUBJECTIVE:                                                                                                                                                                                           SUBJECTIVE STATEMENT: Pt  reports she was exhausted after aquatic therapy appt.  She used ice/ heat on shoulders.  She went to [facility] center and had her arms straight at her sides (like suggested last session).  "It was weird but it didn't hurt as bad as when I rest them in my lap or on my purse".  She has first appt with pain management on Monday.    From initial evaluation:  Main problem is the pain in my shoulders. Doctors say they need to be replaced.  I feel so weak and tired they keep telling me my tests are just hints of Lupus but I have all the symptoms.  Had the PRP but only worked a few weeks in the shoulder. I was sick recently for 2 weeks had covid. I have arthritis everywhere. It takes me a couple hours to get going in the morning. I go to [facility] 3 x week with Erskine Squibb and the advanced arthritis program. Thought heated pool would help.  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 Fundamentally she needs shoulder replacement. She would like to avoid surgery and is looking for some other options. She had steroid injections about 6 weeks ago at [facility] orthopedics which only lasted a week or 2.   Rheumatologist: positive ANA and lupus anticoagulant positive   PAIN:  Are you having pain? No : NPRS scale: 0/10 at rest  Pain location:  Pain description:  Aggravating factors: Relieving factors:  Shoulder: current 5/10   PRECAUTIONS: Shoulder and Fall  RED FLAGS: None   WEIGHT BEARING  RESTRICTIONS: No  FALLS:  Has patient fallen in last 6 months? No  LIVING ENVIRONMENT: Lives with: lives alone Lives in: House/apartment Stairs: No Has following equipment at home: None  OCCUPATION: retired  PLOF: Independent  PATIENT GOALS: decrease pain, increase energy  NEXT MD VISIT: oct  OBJECTIVE:   DIAGNOSTIC FINDINGS:  None recent in chart  PATIENT SURVEYS:  Foto   COGNITION: Overall cognitive status: Within functional limits for tasks assessed     SENSATION: WFL   POSTURE: rounded shoulders, forward head, increased thoracic kyphosis, and flexed trunk   PALPATION: TTP and muscle tightness throughout cervical spine and upper/middle traps    UPPER EXTREMITY ROM:  Active ROM Right eval Left eval  Shoulder flexion 90 45  Shoulder extension    Shoulder abduction 90 60  Shoulder adduction    Shoulder extension Pt refused due to pain Pt refused due to pain  Shoulder internal rotation FT to hips FT to hips  Shoulder external rotation Hand to shoulder Hand to shoulder  Elbow flexion    Elbow extension    Wrist flexion    Wrist extension    Wrist ulnar deviation    Wrist radial deviation    Wrist pronation    Wrist supination     (Blank rows = not tested)   LUMBAR ROM:   AROM eval  Flexion FT below patella  Extension neutral  Right lateral flexion 25%  Left lateral flexion 25%  Right rotation   Left rotation    (Blank rows = not tested)  LOWER EXTREMITY ROM:     Knee ROM WFL Left hip flex 100d  LOWER EXTREMITY MMT:    MMT Right eval Left eval  Hip flexion 4- 3+  Hip extension    Hip abduction 4- 4-  Hip adduction 4- 4-  Hip internal rotation    Hip external rotation  Knee flexion 4- 4-  Knee extension 4 4  Ankle dorsiflexion 5 5  Ankle plantarflexion 5 5  Ankle inversion    Ankle eversion     (Blank rows = not tested)    FUNCTIONAL TESTS:  5 times sit to stand: 17.31 Timed up and go (TUG): 19.55 4 stage: Passed  stage 1&2  Stage 3: tandem x 4s Stage 4: SLS x3s  GAIT: Distance walked: 400 ft Assistive device utilized: None Level of assistance: Complete Independence Comments: no arm swing, cadence slowed, shortened step length, posture guarded, of loading towards right  TODAY'S TREATMENT:                                                                                                                              Pt seen for aquatic therapy today.  Treatment took place in water 3.5-4.75 ft in depth at the [facility] pool. Temp of water was 91.  Pt entered/exited the pool via stairs in step-to pattern independently with bilat rail.   * unsupported - walking forward / backward with arms relaxed at side ( arm swing not tolerated); side stepping with arm addct/ abdct (low range and hand in anatomical position) * bilat shoulder ext/flex (to tolerance) ; elbow flex/ ext  * bilat shoulder IR/ER - 2 rep, stopped due to pain up to 7/10 in Lt shoulder  * L stretch for Lat stretch x 8 (only holding 3-5 sec each)  * single/ bilat overhead reach x 1-3 reps * tricep push down at side with hollow short noodle x 5-8 reps * TrA set with short hollow noodle pull down x 10 * STS at bench in water with feet on blue step x 8 * return to walking forward / backward with reciprocal arm swing; side stepping with gentle arm addct/ abdct * solid noodle under arms (behind back) and cycling LEs  Pt requires the buoyancy and hydrostatic pressure of water for support, and to offload joints by unweighting joint load by at least 50 % in navel deep water and by at least 75-80% in chest to neck deep water.  Viscosity of the water is needed for resistance of strengthening. Water current perturbations provides challenge to standing balance requiring increased core activation.     PATIENT EDUCATION:  Education details: intro to aquatic therapy  Person educated: Patient Education method: Explanation Education  comprehension: verbalized understanding  HOME EXERCISE PROGRAM: Pt goes to water aerobic (Less pain with Erskine Squibb) at [facility] x 3 weekly  (hasn't rejoined yet since illness)  ASSESSMENT:  CLINICAL IMPRESSION: Pt had limited tolerance for reciprocal arm swing initially in water.  She tolerated and reported good stretch in both shoulders with L stretch.  IR/ER and ext continues to be limited and painful in LUE.   By end of session, bilat shoulders "felt looser" and reciprocal arm swing was tolerated.  Will continue to review safe exercises to complete in water and which ones to  avoid. Plan to gradually increase her activity tolerance; she plans to rejoin [facility] water classes in Nov.  Goals are ongoing.     From initial evaluation: Patient is a 77 y.o. f who was seen today for physical therapy evaluation and treatment for Joint pain left hip, shoulders, and knees. 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 She presents today with main complaint of shoulder pain.  She had PRP which did not help.  She is unable to wash under her arms without a brush.  She reports she has all over body pain daily all day and is just recovering from a 2 week stint with covid, feeling very week. She does live alone. She will benefit from skilled physical therapy to improve ROM and strength of extremities, decrease pain and Improve her functional  mobility and ADL's.  Will initially see her in aquatics to return her to her PLOF prior to covid then transition to land based intervention. She was going to [facility] 3 x week for water exercise when feeling well.   She has not yet decided on having shoulder surgery.  OBJECTIVE IMPAIRMENTS: Abnormal gait, decreased activity tolerance, decreased balance, decreased mobility, decreased ROM, decreased strength, impaired flexibility, and pain.   ACTIVITY LIMITATIONS: carrying, lifting, bending,  sitting, standing, squatting, stairs, bathing, dressing, reach over head, and hygiene/grooming  PARTICIPATION LIMITATIONS: cleaning, laundry, shopping, and community activity  PERSONAL FACTORS: Age and Time since onset of injury/illness/exacerbation are also affecting patient's functional outcome.   REHAB POTENTIAL: Good  CLINICAL DECISION MAKING: Unstable/unpredictable  EVALUATION COMPLEXITY: High   GOALS: Goals reviewed with patient? Yes  SHORT TERM GOALS: Target date: 01/05/23  Pt will tolerate full aquatic sessions consistently without increase in pain and with improving function to demonstrate good toleration and effectiveness of intervention.  Baseline: Goal status: INITIAL  2.  Pt will amb to and from setting along with toleration full aquatic session to demonstrate progressing toleration to activity Baseline:  Goal status: INITIAL  3.  Pt will report return to grocery shopping Baseline: pick up or delivery Goal status: INITIAL  4.  Pt will complete tandem and SLS submerged x 20s unsupported Baseline:land based 3-4s  Goal status: INITIAL  5.  Pt will improve on 5 X STS test to <or= 15s to demonstrate improving functional lower extremity strength, transitional movements, and balance Baseline: 19.55 Goal status: INITIAL    LONG TERM GOALS: Target date: 01/28/23  Pt to meet stated Foto Goal Baseline:  Goal status: INITIAL  2.  Pt will be able to wash under arms (personal goal) without brush Baseline: unable without brush Goal status: INITIAL  3.  Pt will improve left shoulder Flex and abd towards 90d for improved ability with ADLs Baseline:  Goal status: INITIAL  4.  Pt will improve left hip strength to contralateral side to improve gait pattern Baseline:  Goal status: INITIAL  5.  Pt will return to water aerobics at [facility] 3 x week Baseline: not going Goal status: INITIAL  6.  Pt will be indep with final HEP's (land and aquatic as appropriate) for  continued management of condition Baseline:  Goal status: INITIAL  PLAN:  PT FREQUENCY: 1-2x/week  PT DURATION: 8 weeks  PLANNED INTERVENTIONS: Therapeutic exercises, Therapeutic activity, Neuromuscular re-education, Balance training, Gait training, Patient/Family education, Self Care, Joint mobilization, Joint manipulation, Stair training, Orthotic/Fit training, DME instructions, Aquatic Therapy, Dry Needling, Electrical stimulation, Cryotherapy, Moist heat,  scar mobilization, Splintting, Taping, Ionotophoresis 4mg /ml Dexamethasone, Manual therapy, and Re-evaluation.  PLAN FOR NEXT SESSION: Aquatics pain relief, gentle movement/ROM and stretching LE, core and shoulders.  Mayer Camel, PTA 12/16/22 5:00 PM [facility] [address] [place], [place], [address] Phone: [phone]   Fax:  [phone]

## 2022-12-20 ENCOUNTER — Encounter
Payer: Medicare Other | Attending: Physical Medicine and Rehabilitation | Admitting: Physical Medicine and Rehabilitation

## 2022-12-20 VITALS — BP 117/78 | HR 76 | Ht 59.0 in | Wt 136.0 lb

## 2022-12-20 DIAGNOSIS — G8929 Other chronic pain: Secondary | ICD-10-CM | POA: Diagnosis not present

## 2022-12-20 DIAGNOSIS — R5383 Other fatigue: Secondary | ICD-10-CM | POA: Insufficient documentation

## 2022-12-20 DIAGNOSIS — R52 Pain, unspecified: Secondary | ICD-10-CM | POA: Diagnosis not present

## 2022-12-20 DIAGNOSIS — M25512 Pain in left shoulder: Secondary | ICD-10-CM | POA: Insufficient documentation

## 2022-12-20 DIAGNOSIS — M25511 Pain in right shoulder: Secondary | ICD-10-CM | POA: Diagnosis not present

## 2022-12-20 DIAGNOSIS — M797 Fibromyalgia: Secondary | ICD-10-CM | POA: Diagnosis not present

## 2022-12-20 DIAGNOSIS — E559 Vitamin D deficiency, unspecified: Secondary | ICD-10-CM | POA: Insufficient documentation

## 2022-12-20 DIAGNOSIS — Z96651 Presence of right artificial knee joint: Secondary | ICD-10-CM | POA: Insufficient documentation

## 2022-12-20 NOTE — Patient Instructions (Signed)
  1) Ginger (especially studied for arthritis)- reduce leukotriene production to decrease inflammation 2) Blueberries- high in phytonutrients that decrease inflammation 3) Salmon- marine omega-3s reduce joint swelling and pain 4) Pumpkin seeds- reduce inflammation 5) dark chocolate- reduces inflammation 6) turmeric- reduces inflammation 7) tart cherries - reduce pain and stiffness 8) extra virgin olive oil - its compound olecanthal helps to block prostaglandins  9) chili peppers- can be eaten or applied topically via capsaicin 10) mint- helpful for headache, muscle aches, joint pain, and itching 11) garlic- reduces inflammation  Link to further information on diet for chronic pain: http://www.bray.com/   Turmeric to reduce inflammation--can be used in cooking or taken as a supplement.  Benefits of turmeric:  -Highly anti-inflammatory  -Increases antioxidants  -Improves memory, attention, brain disease  -Lowers risk of heart disease  -May help prevent cancer  -Decreases pain  -Alleviates depression  -Delays aging and decreases risk of chronic disease  -Consume with black pepper to increase absorption    Turmeric Milk Recipe:  1 cup milk  1 tsp turmeric  1 tsp cinnamon  1 tsp grated ginger (optional)  Black pepper (boosts the anti-inflammatory properties of turmeric).  1 tsp honey    Collagen Ancient Remedies Lean

## 2022-12-20 NOTE — Progress Notes (Signed)
Subjective:    Patient ID: Morgan Reed, female    DOB: [birth year], 77 y.o.   MRN: [id number]  HPI Morgan Reed is a 77 year old woman who presents with diffuse pain.  1) Diffuse pain -has been present for a couple of years -her ANA was positive -she gets  a rash on her face and was told it is not rosacea -she was checked for lupus -she was never she told she has fibromyalgia  2) 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   Pain Inventory Average Pain 6 Pain Right Now 6 My pain is constant, sharp, dull, stabbing, tingling, and aching  In the last 24 hours, has pain interfered with the following? General activity 7 Relation with others 7 Enjoyment of life 7 What TIME of day is your pain at its worst? morning , daytime, evening, and night Sleep (in general) Fair  Pain is worse with: walking, standing, and some activites Pain improves with: heat/ice and therapy/exercise Relief from Meds: 5  walk without assistance how many minutes can you walk? 15 ability to climb steps?  yes do you drive?  yes  retired I need assistance with the following:  feeding, dressing, bathing, and household duties  weakness tremor  New pt  New pt    Family History  Problem Relation Age of Onset   Cancer Mother        leukemia   Cancer Brother        liver cancer   Healthy Son    Healthy Daughter    Social History   Socioeconomic History   Marital status: Widowed    Spouse name: Not on file   Number of children: Not on file   Years of education: Not on file   Highest education level: Not on file  Occupational History   Not on file  Tobacco Use   Smoking status: Former    Current packs/day: 0.00    Types: Cigarettes    Quit date: 78    Years since quitting: 46.8    Passive exposure: Never   Smokeless tobacco: Never  Vaping Use    Vaping status: Never Used  Substance and Sexual Activity   Alcohol use: Yes    Comment: rare   Drug use: Never   Sexual activity: Not on file  Other Topics Concern   Not on file  Social History Narrative   Not on file   Social Determinants of Health   Financial Resource Strain: Not on file  Food Insecurity: Not on file  Transportation Needs: Not on file  Physical Activity: Not on file  Stress: Not on file  Social Connections: Not on file   Past Surgical History:  Procedure Laterality Date   ABDOMINAL HYSTERECTOMY  1995   complication with bowel tear   BREAST BIOPSY     right breast benign approx 1990   BREAST EXCISIONAL BIOPSY Right    right breast benign 1990   BUNIONECTOMY     EYE SURGERY Bilateral 2014   REPLACEMENT TOTAL KNEE Right 2013   TONSILLECTOMY     as a child   TOTAL KNEE REVISION Right 12/01/2020   Procedure: RIGHT TOTAL KNEE REVISION;  Surgeon: Gean Birchwood, MD;  Location: [facility];  Service: Orthopedics;  Laterality: Right;   Past Medical History:  Diagnosis Date  Arthritis    knees, hands   BP 117/78   Pulse 76   Ht 4\' 11"  (1.499 m)   Wt 136 lb (61.7 kg)   SpO2 93%   BMI 27.47 kg/m   Opioid Risk Score:   Fall Risk Score:  `1  Depression screen PHQ 2/9     12/20/2022    1:16 PM  Depression screen PHQ 2/9  Decreased Interest 2  Down, Depressed, Hopeless 2  PHQ - 2 Score 4  Altered sleeping 1  Tired, decreased energy 3  Change in appetite 0  Feeling bad or failure about yourself  2  Trouble concentrating 0  Moving slowly or fidgety/restless 0  Suicidal thoughts 0  PHQ-9 Score 10  Difficult doing work/chores Somewhat difficult     Review of Systems  Neurological:  Positive for tremors and weakness.  All other systems reviewed and are negative.     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  -Discussed benefits of exercise in reducing pain. -start aquatherapy, discussed that she finds the warmth of the water comforting, discussed that exercise is one of the most beneficial treatments for fibromyalgia -discussed topaamax 25mg  HS  -food allergy testing ordered -discussed that she does not feel depression but feels discussed with her body -Discussed following foods that may reduce pain: 1) Ginger (especially studied for arthritis)- reduce leukotriene production to decrease inflammation 2) Blueberries- high in phytonutrients that decrease inflammation 3) Salmon- marine omega-3s reduce joint swelling and pain 4) Pumpkin seeds- reduce inflammation 5) dark chocolate- reduces inflammation 6) turmeric- reduces inflammation 7) tart cherries - reduce pain and stiffness 8) extra virgin olive oil - its compound olecanthal helps to block prostaglandins  9) chili peppers- can be eaten or applied topically via capsaicin 10) mint- helpful for headache, muscle aches, joint pain, and itching 11) garlic- reduces inflammation  Link to further information on diet for chronic pain: http://www.bray.com/   Turmeric to reduce inflammation--can be used in cooking or taken as a supplement.  Benefits of turmeric:  -Highly anti-inflammatory  -Increases antioxidants  -Improves memory, attention, brain disease  -Lowers risk of heart disease  -May help prevent cancer  -Decreases pain  -Alleviates depression  -Delays aging and decreases risk of chronic disease  -Consume with black pepper to increase  absorption    Turmeric Milk Recipe:  1 cup milk  1 tsp turmeric  1 tsp cinnamon  1 tsp grated ginger (optional)  Black pepper (boosts the anti-inflammatory properties of turmeric).  1 tsp honey   2) s/p right TKA revision: -discussed that she continues to have pain here  3) Bilateral shoulder pain: -discussed that she been told she would benefit from shoulder replacements  4) Insomnia: -prescribed topamax 25mg  HS -Try to go outside near sunrise -Get exercise during the day.  -Turn off all devices an hour before bedtime.  -Teas that can benefit: chamomile, valerian root, Brahmi (Bacopa) -Can consider over the counter melatonin, magnesium, and/or L-theanine. Melatonin is an anti-oxidant with multiple health benefits. Magnesium is involved in greater than 300 enzymatic reactions in the  body and most of [place] are deficient as our soil is often depleted. There are 7 different types of magnesium- Bioptemizer's is a supplement with all 7 types, and each has unique benefits. Magnesium can also help with constipation and anxiety.  -Pistachios naturally increase the production of melatonin -Cozy Earth bamboo bed sheets are free from toxic chemicals.  -Tart cherry juice or a tart cherry supplement can improve sleep and soreness post-workout  5) Fatigue:  -continue vitamin D supplement

## 2022-12-21 ENCOUNTER — Ambulatory Visit (HOSPITAL_BASED_OUTPATIENT_CLINIC_OR_DEPARTMENT_OTHER): Payer: Medicare Other | Admitting: Physical Therapy

## 2022-12-21 ENCOUNTER — Encounter: Payer: Self-pay | Admitting: Physical Medicine and Rehabilitation

## 2022-12-21 ENCOUNTER — Encounter (HOSPITAL_BASED_OUTPATIENT_CLINIC_OR_DEPARTMENT_OTHER): Payer: Self-pay | Admitting: Physical Therapy

## 2022-12-21 DIAGNOSIS — R262 Difficulty in walking, not elsewhere classified: Secondary | ICD-10-CM | POA: Diagnosis not present

## 2022-12-21 DIAGNOSIS — M25511 Pain in right shoulder: Secondary | ICD-10-CM | POA: Diagnosis not present

## 2022-12-21 DIAGNOSIS — M25512 Pain in left shoulder: Secondary | ICD-10-CM | POA: Diagnosis not present

## 2022-12-21 DIAGNOSIS — G8929 Other chronic pain: Secondary | ICD-10-CM | POA: Diagnosis not present

## 2022-12-21 DIAGNOSIS — M25562 Pain in left knee: Secondary | ICD-10-CM | POA: Diagnosis not present

## 2022-12-21 DIAGNOSIS — M25561 Pain in right knee: Secondary | ICD-10-CM | POA: Diagnosis not present

## 2022-12-21 DIAGNOSIS — M6281 Muscle weakness (generalized): Secondary | ICD-10-CM

## 2022-12-21 LAB — VITAMIN D 25 HYDROXY (VIT D DEFICIENCY, FRACTURES): Vit D, 25-Hydroxy: 47.8 ng/mL (ref 30.0–100.0)

## 2022-12-21 MED ORDER — TOPIRAMATE 25 MG PO TABS: 25.0000 mg | ORAL_TABLET | Freq: Every day | ORAL | 3 refills | Status: DC

## 2022-12-21 NOTE — Therapy (Signed)
OUTPATIENT PHYSICAL THERAPY THORACOLUMBAR TREATMENT   Patient Name: Morgan Reed MRN: [id number] DOB:07/26/1945, 77 y.o., female Today's Date: 12/21/2022  END OF SESSION:  PT End of Session - 12/21/22 1801     Visit Number 4    Number of Visits 12    Date for PT Re-Evaluation 01/28/23    Authorization Type MCR    Progress Note Due on Visit 10    PT Start Time 1701    PT Stop Time 1740    PT Time Calculation (min) 39 min    Activity Tolerance Patient tolerated treatment well    Behavior During Therapy WFL for tasks assessed/performed              Past Medical History:  Diagnosis Date   Arthritis    knees, hands   Past Surgical History:  Procedure Laterality Date   ABDOMINAL HYSTERECTOMY  1995   complication with bowel tear   BREAST BIOPSY     right breast benign approx 1990   BREAST EXCISIONAL BIOPSY Right    right breast benign 1990   BUNIONECTOMY     EYE SURGERY Bilateral 2014   REPLACEMENT TOTAL KNEE Right 2013   TONSILLECTOMY     as a child   TOTAL KNEE REVISION Right 12/01/2020   Procedure: RIGHT TOTAL KNEE REVISION;  Surgeon: Gean Birchwood, MD;  Location: [facility];  Service: Orthopedics;  Laterality: Right;   Patient Active Problem List   Diagnosis Date Noted   Chronic pain of both shoulders 07/21/2022   DJD of both shoulders 07/21/2022   S/P revision of total knee, 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   Lupus anticoagulant positive 11/07/2020   Osteoarthritis 10/31/2020   Hyperlipidemia 10/31/2020   Obesity, Class I, BMI 30-34.9 10/31/2020   Osteopenia 10/31/2020   Insomnia 10/31/2020   Abnormal coagulation profile 10/31/2020   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) 10/31/2020    PCP: Wilfrid Lund, PA   REFERRING PROVIDER: Gearldine Bienenstock, PA-C   REFERRING DIAG:   Trochanteric bursitis, left hip  M25.561,M25.562,G89.29 (ICD-10-CM) - Chronic pain of both knees  M25.511,G89.29,M25.512 (ICD-10-CM) - Chronic pain of both shoulders    Rationale  for Evaluation and Treatment: Rehabilitation  THERAPY DIAG:  Chronic pain of both shoulders  Chronic pain of both knees  Difficulty in walking, not elsewhere classified  Muscle weakness (generalized)  ONSET DATE: multiped years  SUBJECTIVE:                                                                                                                                                                                           SUBJECTIVE STATEMENT: "  I feel pretty good.  Saw pain management and she is starting me on a new med today for fibromyalgia.  From initial evaluation:  Main problem is the pain in my shoulders. Doctors say they need to be replaced.  I feel so weak and tired they keep telling me my tests are just hints of Lupus but I have all the symptoms.  Had the PRP but only worked a few weeks in the shoulder. I was sick recently for 2 weeks had covid. I have arthritis everywhere. It takes me a couple hours to get going in the morning. I go to [employer] 3 x week with Erskine Squibb and the advanced arthritis program. Thought heated pool would help.  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 Fundamentally she needs shoulder replacement. She would like to avoid surgery and is looking for some other options. She had steroid injections about 6 weeks ago at [facility] orthopedics which only lasted a week or 2.   Rheumatologist: positive ANA and lupus anticoagulant positive   PAIN:  Are you having pain? No : NPRS scale: 0/10 at rest  Shoulder: current 3/10   PRECAUTIONS: Shoulder and Fall  RED FLAGS: None   WEIGHT BEARING RESTRICTIONS: No  FALLS:  Has patient fallen in last 6 months? No  LIVING ENVIRONMENT: Lives with: lives alone Lives in: House/apartment Stairs: No Has following equipment at home: None  OCCUPATION: retired  PLOF: Independent  PATIENT GOALS: decrease pain, increase energy  NEXT MD VISIT:  oct  OBJECTIVE:   DIAGNOSTIC FINDINGS:  None recent in chart  PATIENT SURVEYS:  Foto   COGNITION: Overall cognitive status: Within functional limits for tasks assessed     SENSATION: WFL   POSTURE: rounded shoulders, forward head, increased thoracic kyphosis, and flexed trunk   PALPATION: TTP and muscle tightness throughout cervical spine and upper/middle traps    UPPER EXTREMITY ROM:  Active ROM Right eval Left eval  Shoulder flexion 90 45  Shoulder extension    Shoulder abduction 90 60  Shoulder adduction    Shoulder extension Pt refused due to pain Pt refused due to pain  Shoulder internal rotation FT to hips FT to hips  Shoulder external rotation Hand to shoulder Hand to shoulder  Elbow flexion    Elbow extension    Wrist flexion    Wrist extension    Wrist ulnar deviation    Wrist radial deviation    Wrist pronation    Wrist supination     (Blank rows = not tested)   LUMBAR ROM:   AROM eval  Flexion FT below patella  Extension neutral  Right lateral flexion 25%  Left lateral flexion 25%  Right rotation   Left rotation    (Blank rows = not tested)  LOWER EXTREMITY ROM:     Knee ROM WFL Left hip flex 100d  LOWER EXTREMITY MMT:    MMT Right eval Left eval  Hip flexion 4- 3+  Hip extension    Hip abduction 4- 4-  Hip adduction 4- 4-  Hip internal rotation    Hip external rotation    Knee flexion 4- 4-  Knee extension 4 4  Ankle dorsiflexion 5 5  Ankle plantarflexion 5 5  Ankle inversion    Ankle eversion     (Blank rows = not tested)    FUNCTIONAL TESTS:  5 times sit to stand: 17.31 Timed up and go (TUG): 19.55 4 stage:  Passed stage 1&2  Stage 3: tandem x 4s Stage 4: SLS x3s  GAIT: Distance walked: 400 ft Assistive device utilized: None Level of assistance: Complete Independence Comments: no arm swing, cadence slowed, shortened step length, posture guarded, of loading towards right  TODAY'S TREATMENT:                                                                                                                               Pt seen for aquatic therapy today.  Treatment took place in water 3.5-4.75 ft in depth at the [facility] pool. Temp of water was 91.  Pt entered/exited the pool via stairs in step-to pattern independently with bilat rail.   * unsupported - walking forward / backward with arm * L stretch for Lat stretch x 8 holding 3-5 sec   *staggered stance with horizontal abd *side stepping x 4 widths with shoulder add/abd * tricep push down at side with hollow short noodle x 5-8 reps *warrior 1 staggered stance leading R/L x 5 * bilat elbow flex/ ext  * bilat shoulder IR/ER - x10 R/L.  Less range Left vs Right.  Tolerated well right did increase pain left.  Decreased range for better toleration * return to walking forward / backward with reciprocal arm swing between exercises for recovery.   Pt requires the buoyancy and hydrostatic pressure of water for support, and to offload joints by unweighting joint load by at least 50 % in navel deep water and by at least 75-80% in chest to neck deep water.  Viscosity of the water is needed for resistance of strengthening. Water current perturbations provides challenge to standing balance requiring increased core activation.     PATIENT EDUCATION:  Education details: intro to aquatic therapy  Person educated: Patient Education method: Explanation Education comprehension: verbalized understanding  HOME EXERCISE PROGRAM: Pt goes to water aerobic (Less pain with Erskine Squibb) at [facility] x 3 weekly  (hasn't rejoined yet since illness)  ASSESSMENT:  CLINICAL IMPRESSION: Visual improvement in bilater shoulder ROM in all directions.   Pain continue at its worst with left shoulder ER but is reduced with decreased ROM towards end range.  She saw pain management yesterday whom has dx her with possible fibromyalgia and she will be started meds today.  Pt is  encouraged as she wants to avoid any kind of surgery.  Reports overall pain sensitivity down today tolerating reciprocal ue movement without limitation.  Goals ongoing      From initial evaluation: Patient is a 77 y.o. f who was seen today for physical therapy evaluation and treatment for Joint pain left hip, shoulders, and knees. 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 She presents today with main complaint of shoulder pain.  She had PRP which did not help.  She is unable to wash under her arms without a brush.  She reports she has  all over body pain daily all day and is just recovering from a 2 week stint with covid, feeling very week. She does live alone. She will benefit from skilled physical therapy to improve ROM and strength of extremities, decrease pain and Improve her functional  mobility and ADL's.  Will initially see her in aquatics to return her to her PLOF prior to covid then transition to land based intervention. She was going to the [facility] 3 x week for water exercise when feeling well.   She has not yet decided on having shoulder surgery.  OBJECTIVE IMPAIRMENTS: Abnormal gait, decreased activity tolerance, decreased balance, decreased mobility, decreased ROM, decreased strength, impaired flexibility, and pain.   ACTIVITY LIMITATIONS: carrying, lifting, bending, sitting, standing, squatting, stairs, bathing, dressing, reach over head, and hygiene/grooming  PARTICIPATION LIMITATIONS: cleaning, laundry, shopping, and community activity  PERSONAL FACTORS: Age and Time since onset of injury/illness/exacerbation are also affecting patient's functional outcome.   REHAB POTENTIAL: Good  CLINICAL DECISION MAKING: Unstable/unpredictable  EVALUATION COMPLEXITY: High   GOALS: Goals reviewed with patient? Yes  SHORT TERM GOALS: Target date: 01/05/23  Pt will tolerate full aquatic sessions  consistently without increase in pain and with improving function to demonstrate good toleration and effectiveness of intervention.  Baseline: Goal status: INITIAL  2.  Pt will amb to and from setting along with toleration full aquatic session to demonstrate progressing toleration to activity Baseline:  Goal status: INITIAL  3.  Pt will report return to grocery shopping Baseline: pick up or delivery Goal status: INITIAL  4.  Pt will complete tandem and SLS submerged x 20s unsupported Baseline:land based 3-4s  Goal status: INITIAL  5.  Pt will improve on 5 X STS test to <or= 15s to demonstrate improving functional lower extremity strength, transitional movements, and balance Baseline: 19.55 Goal status: INITIAL    LONG TERM GOALS: Target date: 01/28/23  Pt to meet stated Foto Goal Baseline:  Goal status: INITIAL  2.  Pt will be able to wash under arms (personal goal) without brush Baseline: unable without brush Goal status: INITIAL  3.  Pt will improve left shoulder Flex and abd towards 90d for improved ability with ADLs Baseline:  Goal status: INITIAL  4.  Pt will improve left hip strength to contralateral side to improve gait pattern Baseline:  Goal status: INITIAL  5.  Pt will return to water aerobics at [facility] 3 x week Baseline: not going Goal status: INITIAL  6.  Pt will be indep with final HEP's (land and aquatic as appropriate) for continued management of condition Baseline:  Goal status: INITIAL  PLAN:  PT FREQUENCY: 1-2x/week  PT DURATION: 8 weeks  PLANNED INTERVENTIONS: Therapeutic exercises, Therapeutic activity, Neuromuscular re-education, Balance training, Gait training, Patient/Family education, Self Care, Joint mobilization, Joint manipulation, Stair training, Orthotic/Fit training, DME instructions, Aquatic Therapy, Dry Needling, Electrical stimulation, Cryotherapy, Moist heat, scar mobilization, Splintting, Taping, Ionotophoresis 4mg /ml  Dexamethasone, Manual therapy, and Re-evaluation.  PLAN FOR NEXT SESSION: Aquatics pain relief, gentle movement/ROM and stretching LE, core and shoulders.  [address] [place]) Helene Bernstein MPT 12/21/22 6:02 PM [facility] [address] [place], [place], [address] Phone: [phone]   Fax:  [phone]

## 2022-12-23 ENCOUNTER — Encounter (HOSPITAL_BASED_OUTPATIENT_CLINIC_OR_DEPARTMENT_OTHER): Payer: Self-pay | Admitting: Physical Therapy

## 2022-12-23 ENCOUNTER — Encounter (HOSPITAL_BASED_OUTPATIENT_CLINIC_OR_DEPARTMENT_OTHER): Payer: Medicare Other | Admitting: Physical Medicine and Rehabilitation

## 2022-12-23 ENCOUNTER — Ambulatory Visit (HOSPITAL_BASED_OUTPATIENT_CLINIC_OR_DEPARTMENT_OTHER): Payer: Medicare Other | Admitting: Physical Therapy

## 2022-12-23 DIAGNOSIS — G8929 Other chronic pain: Secondary | ICD-10-CM

## 2022-12-23 DIAGNOSIS — M25562 Pain in left knee: Secondary | ICD-10-CM | POA: Diagnosis not present

## 2022-12-23 DIAGNOSIS — R5383 Other fatigue: Secondary | ICD-10-CM

## 2022-12-23 DIAGNOSIS — M25561 Pain in right knee: Secondary | ICD-10-CM | POA: Diagnosis not present

## 2022-12-23 DIAGNOSIS — M25512 Pain in left shoulder: Secondary | ICD-10-CM | POA: Diagnosis not present

## 2022-12-23 DIAGNOSIS — R262 Difficulty in walking, not elsewhere classified: Secondary | ICD-10-CM | POA: Diagnosis not present

## 2022-12-23 DIAGNOSIS — M25511 Pain in right shoulder: Secondary | ICD-10-CM | POA: Diagnosis not present

## 2022-12-23 MED ORDER — VITAMIN D (ERGOCALCIFEROL) 1.25 MG (50000 UNIT) PO CAPS: 50000.0000 [IU] | ORAL_CAPSULE | ORAL | 0 refills | Status: DC

## 2022-12-23 NOTE — Therapy (Signed)
OUTPATIENT PHYSICAL THERAPY THORACOLUMBAR TREATMENT   Patient Name: Morgan Reed MRN: [id number] DOB:Jan 12, 1946, 77 y.o., female Today's Date: 12/23/2022  END OF SESSION:  PT End of Session - 12/23/22 1704     Visit Number 5    Number of Visits 12    Date for PT Re-Evaluation 01/28/23    Authorization Type MCR    Progress Note Due on Visit 10    PT Start Time 1701    PT Stop Time 1745    PT Time Calculation (min) 44 min    Activity Tolerance Patient tolerated treatment well    Behavior During Therapy WFL for tasks assessed/performed              Past Medical History:  Diagnosis Date   Arthritis    knees, hands   Past Surgical History:  Procedure Laterality Date   ABDOMINAL HYSTERECTOMY  1995   complication with bowel tear   BREAST BIOPSY     right breast benign approx 1990   BREAST EXCISIONAL BIOPSY Right    right breast benign 1990   BUNIONECTOMY     EYE SURGERY Bilateral 2014   REPLACEMENT TOTAL KNEE Right 2013   TONSILLECTOMY     as a child   TOTAL KNEE REVISION Right 12/01/2020   Procedure: RIGHT TOTAL KNEE REVISION;  Surgeon: Gean Birchwood, MD;  Location: [facility];  Service: Orthopedics;  Laterality: Right;   Patient Active Problem List   Diagnosis Date Noted   Chronic pain of both shoulders 07/21/2022   DJD of both shoulders 07/21/2022   S/P revision of total knee, 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   Lupus anticoagulant positive 11/07/2020   Osteoarthritis 10/31/2020   Hyperlipidemia 10/31/2020   Obesity, Class I, BMI 30-34.9 10/31/2020   Osteopenia 10/31/2020   Insomnia 10/31/2020   Abnormal coagulation profile 10/31/2020   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) 10/31/2020    PCP: Wilfrid Lund, PA   REFERRING PROVIDER: Gearldine Bienenstock, PA-C   REFERRING DIAG:   Trochanteric bursitis, left hip  M25.561,M25.562,G89.29 (ICD-10-CM) - Chronic pain of both knees  M25.511,G89.29,M25.512 (ICD-10-CM) - Chronic pain of both shoulders    Rationale  for Evaluation and Treatment: Rehabilitation  THERAPY DIAG:  Chronic pain of both shoulders  Chronic pain of both knees  ONSET DATE: multiped years  SUBJECTIVE:                                                                                                                                                                                           SUBJECTIVE STATEMENT: "Shoulder pain increased to 5/10 after last session.  Was better  in am. A little sore today I think because I have been aggravated"  From initial evaluation:  Main problem is the pain in my shoulders. Doctors say they need to be replaced.  I feel so weak and tired they keep telling me my tests are just hints of Lupus but I have all the symptoms.  Had the PRP but only worked a few weeks in the shoulder. I was sick recently for 2 weeks had covid. I have arthritis everywhere. It takes me a couple hours to get going in the morning. I go to [employer] 3 x week with Erskine Squibb and the advanced arthritis program. Thought heated pool would help.  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 Fundamentally she needs shoulder replacement. She would like to avoid surgery and is looking for some other options. She had steroid injections about 6 weeks ago at [facility] orthopedics which only lasted a week or 2.   Rheumatologist: positive ANA and lupus anticoagulant positive   PAIN:  Are you having pain? No : NPRS scale: 0/10 at rest  Shoulder: current 5/10   PRECAUTIONS: Shoulder and Fall  RED FLAGS: None   WEIGHT BEARING RESTRICTIONS: No  FALLS:  Has patient fallen in last 6 months? No  LIVING ENVIRONMENT: Lives with: lives alone Lives in: House/apartment Stairs: No Has following equipment at home: None  OCCUPATION: retired  PLOF: Independent  PATIENT GOALS: decrease pain, increase energy  NEXT MD VISIT: oct  OBJECTIVE:   DIAGNOSTIC FINDINGS:  None recent in  chart  PATIENT SURVEYS:  Foto   COGNITION: Overall cognitive status: Within functional limits for tasks assessed     SENSATION: WFL   POSTURE: rounded shoulders, forward head, increased thoracic kyphosis, and flexed trunk   PALPATION: TTP and muscle tightness throughout cervical spine and upper/middle traps    UPPER EXTREMITY ROM:  Active ROM Right eval Left eval  Shoulder flexion 90 45  Shoulder extension    Shoulder abduction 90 60  Shoulder adduction    Shoulder extension Pt refused due to pain Pt refused due to pain  Shoulder internal rotation FT to hips FT to hips  Shoulder external rotation Hand to shoulder Hand to shoulder  Elbow flexion    Elbow extension    Wrist flexion    Wrist extension    Wrist ulnar deviation    Wrist radial deviation    Wrist pronation    Wrist supination     (Blank rows = not tested)   LUMBAR ROM:   AROM eval  Flexion FT below patella  Extension neutral  Right lateral flexion 25%  Left lateral flexion 25%  Right rotation   Left rotation    (Blank rows = not tested)  LOWER EXTREMITY ROM:     Knee ROM WFL Left hip flex 100d  LOWER EXTREMITY MMT:    MMT Right eval Left eval  Hip flexion 4- 3+  Hip extension    Hip abduction 4- 4-  Hip adduction 4- 4-  Hip internal rotation    Hip external rotation    Knee flexion 4- 4-  Knee extension 4 4  Ankle dorsiflexion 5 5  Ankle plantarflexion 5 5  Ankle inversion    Ankle eversion     (Blank rows = not tested)    FUNCTIONAL TESTS:  5 times sit to stand: 17.31 Timed up and go (TUG): 19.55 4 stage: Passed stage 1&2  Stage 3: tandem  x 4s Stage 4: SLS x3s  GAIT: Distance walked: 400 ft Assistive device utilized: None Level of assistance: Complete Independence Comments: no arm swing, cadence slowed, shortened step length, posture guarded, of loading towards right  TODAY'S TREATMENT:                                                                                                                               Pt seen for aquatic therapy today.  Treatment took place in water 3.5-4.75 ft in depth at the [facility] pool. Temp of water was 91.  Pt entered/exited the pool via stairs in step-to pattern independently with bilat rail.   * unsupported - walking forward / backward with arm swing *side stepping x 4 widths with shoulder add/abd * L stretch for Lat stretch x 3 holding 10sec   *staggered stance with horizontal abd x 10 * tricep push down at side with hollow rainbow HB (difficult) x 5 reps * bilat elbow flex/ ext; shoulder add/abd and flex/ext 0-90 wide stance x 5 *Seated on lift: cycling; hip add/abd; LAQ; SLR 2 x 20 reps * return to walking forward / backward with reciprocal arm swing between exercises for recovery.   Pt requires the buoyancy and hydrostatic pressure of water for support, and to offload joints by unweighting joint load by at least 50 % in navel deep water and by at least 75-80% in chest to neck deep water.  Viscosity of the water is needed for resistance of strengthening. Water current perturbations provides challenge to standing balance requiring increased core activation.     PATIENT EDUCATION:  Education details: intro to aquatic therapy  Person educated: Patient Education method: Explanation Education comprehension: verbalized understanding  HOME EXERCISE PROGRAM: Pt goes to water aerobic (Less pain with Erskine Squibb) at [facility] x 3 weekly  (hasn't rejoined yet since illness)  ASSESSMENT:  CLINICAL IMPRESSION: Pt with increase pain sensitivity today decreasing toleration to exercises.  She does put forth good effort and reports reduction in pain with gentle movement as session progresses.  Pt unable to tolerate much core engagement due to her UE not tolerating (hold noodle or HB).  Able to work LE in sitting. After session pt in [place] with gentle UE movements.      From initial evaluation: Patient is a 77 y.o. f  who was seen today for physical therapy evaluation and treatment for Joint pain left hip, shoulders, and knees. 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 She presents today with main complaint of shoulder pain.  She had PRP which did not help.  She is unable to wash under her arms without a brush.  She reports she has all over body pain daily all day and is just recovering from a 2 week stint with covid, feeling very week. She does live alone. She will benefit from skilled physical therapy to improve ROM  and strength of extremities, decrease pain and Improve her functional  mobility and ADL's.  Will initially see her in aquatics to return her to her PLOF prior to covid then transition to land based intervention. She was going to the [facility] 3 x week for water exercise when feeling well.   She has not yet decided on having shoulder surgery.  OBJECTIVE IMPAIRMENTS: Abnormal gait, decreased activity tolerance, decreased balance, decreased mobility, decreased ROM, decreased strength, impaired flexibility, and pain.   ACTIVITY LIMITATIONS: carrying, lifting, bending, sitting, standing, squatting, stairs, bathing, dressing, reach over head, and hygiene/grooming  PARTICIPATION LIMITATIONS: cleaning, laundry, shopping, and community activity  PERSONAL FACTORS: Age and Time since onset of injury/illness/exacerbation are also affecting patient's functional outcome.   REHAB POTENTIAL: Good  CLINICAL DECISION MAKING: Unstable/unpredictable  EVALUATION COMPLEXITY: High   GOALS: Goals reviewed with patient? Yes  SHORT TERM GOALS: Target date: 01/05/23  Pt will tolerate full aquatic sessions consistently without increase in pain and with improving function to demonstrate good toleration and effectiveness of intervention.  Baseline: Goal status: INITIAL  2.  Pt will amb to and from setting along with toleration  full aquatic session to demonstrate progressing toleration to activity Baseline:  Goal status: INITIAL  3.  Pt will report return to grocery shopping Baseline: pick up or delivery Goal status: INITIAL  4.  Pt will complete tandem and SLS submerged x 20s unsupported Baseline:land based 3-4s  Goal status: INITIAL  5.  Pt will improve on 5 X STS test to <or= 15s to demonstrate improving functional lower extremity strength, transitional movements, and balance Baseline: 19.55 Goal status: INITIAL    LONG TERM GOALS: Target date: 01/28/23  Pt to meet stated Foto Goal Baseline:  Goal status: INITIAL  2.  Pt will be able to wash under arms (personal goal) without brush Baseline: unable without brush Goal status: INITIAL  3.  Pt will improve left shoulder Flex and abd towards 90d for improved ability with ADLs Baseline:  Goal status: INITIAL  4.  Pt will improve left hip strength to contralateral side to improve gait pattern Baseline:  Goal status: INITIAL  5.  Pt will return to water aerobics at [facility] 3 x week Baseline: not going Goal status: INITIAL  6.  Pt will be indep with final HEP's (land and aquatic as appropriate) for continued management of condition Baseline:  Goal status: INITIAL  PLAN:  PT FREQUENCY: 1-2x/week  PT DURATION: 8 weeks  PLANNED INTERVENTIONS: Therapeutic exercises, Therapeutic activity, Neuromuscular re-education, Balance training, Gait training, Patient/Family education, Self Care, Joint mobilization, Joint manipulation, Stair training, Orthotic/Fit training, DME instructions, Aquatic Therapy, Dry Needling, Electrical stimulation, Cryotherapy, Moist heat, scar mobilization, Splintting, Taping, Ionotophoresis 4mg /ml Dexamethasone, Manual therapy, and Re-evaluation.  PLAN FOR NEXT SESSION: Aquatics pain relief, gentle movement/ROM and stretching LE, core and shoulders.  Corrie Dandy [place]) Shaleen Talamantez MPT 12/23/22 5:43 PM [facility]  [facility] [address] [place], [place], [address] Phone: [phone]   Fax:  [phone]

## 2022-12-27 NOTE — Progress Notes (Signed)
Subjective:    Patient ID: Morgan Reed, female    DOB: [birth year], 77 y.o.   MRN: [id number]  HPI Morgan Reed is a 77 year old woman who presents with diffuse pain.  1) Diffuse pain -has been present for a couple of years -her ANA was positive -she gets  a rash on her face and was told it is not rosacea -she was checked for lupus -she was never she told she has fibromyalgia -she takes 25mg  topamax for weight loss but it has not helped her pain- she takes this during the day, she would be willing to try an increased dose  2) 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   3) Fatigue: -she takes 25mg  daily topamax -she saw her vitamin D level was normal  Pain Inventory Average Pain 6 Pain Right Now 6 My pain is constant, sharp, dull, stabbing, tingling, and aching  In the last 24 hours, has pain interfered with the following? General activity 7 Relation with others 7 Enjoyment of life 7 What TIME of day is your pain at its worst? morning , daytime, evening, and night Sleep (in general) Fair  Pain is worse with: walking, standing, and some activites Pain improves with: heat/ice and therapy/exercise Relief from Meds: 5  walk without assistance how many minutes can you walk? 15 ability to climb steps?  yes do you drive?  yes  retired I need assistance with the following:  feeding, dressing, bathing, and household duties  weakness tremor  New pt  New pt    Family History  Problem Relation Age of Onset   Cancer Mother        leukemia   Cancer Brother        liver cancer   Healthy Son    Healthy Daughter    Social History   Socioeconomic History   Marital status: Widowed    Spouse name: Not on file   Number of children: Not on file   Years of education: Not on file   Highest education level: Not on file  Occupational  History   Not on file  Tobacco Use   Smoking status: Former    Current packs/day: 0.00    Types: Cigarettes    Quit date: 59    Years since quitting: 46.8    Passive exposure: Never   Smokeless tobacco: Never  Vaping Use   Vaping status: Never Used  Substance and Sexual Activity   Alcohol use: Yes    Comment: rare   Drug use: Never   Sexual activity: Not on file  Other Topics Concern   Not on file  Social History Narrative   Not on file   Social Determinants of Health   Financial Resource Strain: Not on file  Food Insecurity: Not on file  Transportation Needs: Not on file  Physical Activity: Not on file  Stress: Not on file  Social Connections: Not on file   Past Surgical History:  Procedure Laterality Date   ABDOMINAL HYSTERECTOMY  1995   complication with bowel tear   BREAST BIOPSY     right breast benign approx 1990   BREAST EXCISIONAL BIOPSY Right    right breast benign 1990   BUNIONECTOMY     EYE SURGERY Bilateral 2014   REPLACEMENT TOTAL KNEE Right 2013   TONSILLECTOMY  as a child   TOTAL KNEE REVISION Right 12/01/2020   Procedure: RIGHT TOTAL KNEE REVISION;  Surgeon: Gean Birchwood, MD;  Location: [facility];  Service: Orthopedics;  Laterality: Right;   Past Medical History:  Diagnosis Date   Arthritis    knees, hands   There were no vitals taken for this visit.  Opioid Risk Score:   Fall Risk Score:  `1  Depression screen PHQ 2/9     12/20/2022    1:16 PM  Depression screen PHQ 2/9  Decreased Interest 2  Down, Depressed, Hopeless 2  PHQ - 2 Score 4  Altered sleeping 1  Tired, decreased energy 3  Change in appetite 0  Feeling bad or failure about yourself  2  Trouble concentrating 0  Moving slowly or fidgety/restless 0  Suicidal thoughts 0  PHQ-9 Score 10  Difficult doing work/chores Somewhat difficult     Review of Systems  Neurological:  Positive for tremors and weakness.  All other systems reviewed and are negative.       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  -Discussed benefits of exercise in reducing pain. -start aquatherapy, discussed that she finds the warmth of the water comforting, discussed that exercise is one of the most beneficial treatments for fibromyalgia -discussed changing topamax to 50mg  HS -food allergy testing ordered -discussed that she does not feel depression but feels discussed with her body -Discussed following foods that may reduce pain: 1) Ginger (especially studied for arthritis)- reduce leukotriene production to decrease inflammation 2) Blueberries- high in phytonutrients that decrease inflammation 3) Salmon- marine omega-3s reduce joint swelling and pain 4) Pumpkin seeds- reduce inflammation 5) dark chocolate- reduces inflammation 6) turmeric- reduces inflammation 7) tart cherries - reduce pain and stiffness 8) extra virgin olive oil - its compound olecanthal helps to block prostaglandins  9) chili peppers- can be eaten or applied topically via capsaicin 10) mint- helpful for headache, muscle aches, joint pain, and itching 11) garlic- reduces inflammation  Link to further information on diet for chronic pain: http://www.bray.com/   Turmeric to reduce inflammation--can be used in cooking or taken as a supplement.  Benefits of turmeric:  -Highly anti-inflammatory  -Increases antioxidants  -Improves memory, attention, brain disease  -Lowers risk of heart  disease  -May help prevent cancer  -Decreases pain  -Alleviates depression  -Delays aging and decreases risk of chronic disease  -Consume with black pepper to increase absorption    Turmeric Milk Recipe:  1 cup milk  1 tsp turmeric  1 tsp cinnamon  1 tsp grated ginger (optional)  Black pepper (boosts the anti-inflammatory properties of turmeric).  1 tsp honey   2) s/p right TKA revision: -discussed that she continues to have pain here  3) Bilateral shoulder pain: -discussed that she been told she would benefit from shoulder replacements  4) Insomnia: -prescribed topamax 25mg  HS -Try to go outside near sunrise -Get exercise during the day.  -Turn off all devices an hour before bedtime.  -Teas that can benefit: chamomile, valerian root, Brahmi (Bacopa) -Can consider over the counter melatonin,  magnesium, and/or L-theanine. Melatonin is an anti-oxidant with multiple health benefits. Magnesium is involved in greater than 300 enzymatic reactions in the body and most of [place] are deficient as our soil is often depleted. There are 7 different types of magnesium- Bioptemizer's is a supplement with all 7 types, and each has unique benefits. Magnesium can also help with constipation and anxiety.  -Pistachios naturally increase the production of melatonin -Cozy Earth bamboo bed sheets are free from toxic chemicals.  -Tart cherry juice or a tart cherry supplement can improve sleep and soreness post-workout  5) Fatigue:  -continue vitamin D supplement -recommended high dose ergocalciferol 50,000U once per week on top of this as vitamin D level is in supoptimal range, discussed changing topamax to 50mg  at night  6 minutes spent in discussion of vitamin D level, recommending ergocalciferol 50,000U once per week and changing topamax to 50mg  at night to help with pain

## 2022-12-28 ENCOUNTER — Ambulatory Visit (HOSPITAL_BASED_OUTPATIENT_CLINIC_OR_DEPARTMENT_OTHER): Payer: Medicare Other | Admitting: Physical Therapy

## 2022-12-28 ENCOUNTER — Encounter (HOSPITAL_BASED_OUTPATIENT_CLINIC_OR_DEPARTMENT_OTHER): Payer: Self-pay | Admitting: Physical Therapy

## 2022-12-28 DIAGNOSIS — M25512 Pain in left shoulder: Secondary | ICD-10-CM | POA: Diagnosis not present

## 2022-12-28 DIAGNOSIS — M25511 Pain in right shoulder: Secondary | ICD-10-CM | POA: Diagnosis not present

## 2022-12-28 DIAGNOSIS — G8929 Other chronic pain: Secondary | ICD-10-CM | POA: Diagnosis not present

## 2022-12-28 DIAGNOSIS — M25562 Pain in left knee: Secondary | ICD-10-CM | POA: Diagnosis not present

## 2022-12-28 DIAGNOSIS — M6281 Muscle weakness (generalized): Secondary | ICD-10-CM

## 2022-12-28 DIAGNOSIS — M25561 Pain in right knee: Secondary | ICD-10-CM | POA: Diagnosis not present

## 2022-12-28 DIAGNOSIS — R262 Difficulty in walking, not elsewhere classified: Secondary | ICD-10-CM

## 2022-12-28 NOTE — Therapy (Signed)
OUTPATIENT PHYSICAL THERAPY THORACOLUMBAR TREATMENT   Patient Name: Morgan Reed MRN: [id number] DOB:05-06-1945, 77 y.o., female Today's Date: 12/28/2022  END OF SESSION:  PT End of Session - 12/28/22 1620     Visit Number 6    Number of Visits 12    Date for PT Re-Evaluation 01/28/23    Authorization Type MCR    Progress Note Due on Visit 10    PT Start Time 1618    PT Stop Time 1656    PT Time Calculation (min) 38 min    Activity Tolerance Patient tolerated treatment well    Behavior During Therapy WFL for tasks assessed/performed              Past Medical History:  Diagnosis Date   Arthritis    knees, hands   Past Surgical History:  Procedure Laterality Date   ABDOMINAL HYSTERECTOMY  1995   complication with bowel tear   BREAST BIOPSY     right breast benign approx 1990   BREAST EXCISIONAL BIOPSY Right    right breast benign 1990   BUNIONECTOMY     EYE SURGERY Bilateral 2014   REPLACEMENT TOTAL KNEE Right 2013   TONSILLECTOMY     as a child   TOTAL KNEE REVISION Right 12/01/2020   Procedure: RIGHT TOTAL KNEE REVISION;  Surgeon: Gean Birchwood, MD;  Location: [facility];  Service: Orthopedics;  Laterality: Right;   Patient Active Problem List   Diagnosis Date Noted   Chronic pain of both shoulders 07/21/2022   DJD of both shoulders 07/21/2022   S/P revision of total knee, 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   Lupus anticoagulant positive 11/07/2020   Osteoarthritis 10/31/2020   Hyperlipidemia 10/31/2020   Obesity, Class I, BMI 30-34.9 10/31/2020   Osteopenia 10/31/2020   Insomnia 10/31/2020   Abnormal coagulation profile 10/31/2020   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) 10/31/2020    PCP: Wilfrid Lund, PA   REFERRING PROVIDER: Gearldine Bienenstock, PA-C   REFERRING DIAG:   Trochanteric bursitis, left hip  M25.561,M25.562,G89.29 (ICD-10-CM) - Chronic pain of both knees  M25.511,G89.29,M25.512 (ICD-10-CM) - Chronic pain of both shoulders    Rationale  for Evaluation and Treatment: Rehabilitation  THERAPY DIAG:  Chronic pain of both shoulders  Chronic pain of both knees  Difficulty in walking, not elsewhere classified  Muscle weakness (generalized)  ONSET DATE: multiped years  SUBJECTIVE:                                                                                                                                                                                           SUBJECTIVE STATEMENT:  Pt reports she voted yesterday and stood for 1 hr, "but I made it".  Pt has been using ice pack/ heating pad and pain medication for relief. "I'm so weak I have trouble holding my cup of coffee".   From initial evaluation:  Main problem is the pain in my shoulders. Doctors say they need to be replaced.  I feel so weak and tired they keep telling me my tests are just hints of Lupus but I have all the symptoms.  Had the PRP but only worked a few weeks in the shoulder. I was sick recently for 2 weeks had covid. I have arthritis everywhere. It takes me a couple hours to get going in the morning. I go to [employer] 3 x week with Erskine Squibb and the advanced arthritis program. Thought heated pool would help.  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 Fundamentally she needs shoulder replacement. She would like to avoid surgery and is looking for some other options. She had steroid injections about 6 weeks ago at [facility] orthopedics which only lasted a week or 2.   Rheumatologist: positive ANA and lupus anticoagulant positive   PAIN:  Are you having pain? yes : NPRS scale: 5/10 Location: bilat shoulders, feet Description: sharp    PRECAUTIONS: Shoulder and Fall  RED FLAGS: None   WEIGHT BEARING RESTRICTIONS: No  FALLS:  Has patient fallen in last 6 months? No  LIVING ENVIRONMENT: Lives with: lives alone Lives in: House/apartment Stairs: No Has following equipment at home:  None  OCCUPATION: retired  PLOF: Independent  PATIENT GOALS: decrease pain, increase energy  NEXT MD VISIT: oct  OBJECTIVE:   DIAGNOSTIC FINDINGS:  None recent in chart  PATIENT SURVEYS:  Foto   COGNITION: Overall cognitive status: Within functional limits for tasks assessed     SENSATION: WFL   POSTURE: rounded shoulders, forward head, increased thoracic kyphosis, and flexed trunk   PALPATION: TTP and muscle tightness throughout cervical spine and upper/middle traps    UPPER EXTREMITY ROM:  Active ROM Right eval Left eval  Shoulder flexion 90 45  Shoulder extension    Shoulder abduction 90 60  Shoulder adduction    Shoulder extension Pt refused due to pain Pt refused due to pain  Shoulder internal rotation FT to hips FT to hips  Shoulder external rotation Hand to shoulder Hand to shoulder  Elbow flexion    Elbow extension    Wrist flexion    Wrist extension    Wrist ulnar deviation    Wrist radial deviation    Wrist pronation    Wrist supination     (Blank rows = not tested)   LUMBAR ROM:   AROM eval  Flexion FT below patella  Extension neutral  Right lateral flexion 25%  Left lateral flexion 25%  Right rotation   Left rotation    (Blank rows = not tested)  LOWER EXTREMITY ROM:     Knee ROM WFL Left hip flex 100d  LOWER EXTREMITY MMT:    MMT Right eval Left eval  Hip flexion 4- 3+  Hip extension    Hip abduction 4- 4-  Hip adduction 4- 4-  Hip internal rotation    Hip external rotation    Knee flexion 4- 4-  Knee extension 4 4  Ankle dorsiflexion 5 5  Ankle plantarflexion 5 5  Ankle inversion    Ankle eversion     (Blank rows = not  tested)    FUNCTIONAL TESTS:  5 times sit to stand: 17.31 Timed up and go (TUG): 19.55 4 stage: Passed stage 1&2  Stage 3: tandem x 4s Stage 4: SLS x3s  GAIT: Distance walked: 400 ft Assistive device utilized: None Level of assistance: Complete Independence Comments: no arm swing,  cadence slowed, shortened step length, posture guarded, of loading towards right  TODAY'S TREATMENT:                                                                                                                              * NuStep L2-4, UE(@9 )/ LE  x 3:30 min for ROM warm up * bilat shoulder ext with cane (limited range) x 10 * supine bench press with cane x 8; bilat flex overhead x 4 (painful) * seated table slide into bilat shoulder flexion x 5 * seated shoulder ext and shoulder blade squeeze, sliding cane on thighs x 10 * seated shoulder IR/ER (limited range) holding cane x 6 * standing bicep curls with cane x 6 * L stretch at counter x 2 reps      PATIENT EDUCATION:  Education details: exercise rationale/ modifications Person educated: Patient Education method: Explanation Education comprehension: verbalized understanding  HOME EXERCISE PROGRAM: Pt goes to water aerobic (Less pain with Erskine Squibb) at [facility] x 3 weekly  (hasn't rejoined yet since illness)  ASSESSMENT:  CLINICAL IMPRESSION: Pt with increased pain sensitivity today, decreasing toleration to exercises.  She does put forth good effort throughout, however pain continues to be a limiting barrier.  Verbally assigned table slides into shoulder flexion and bilat shoulder ext while sliding cane on thighs, with scap squeeze.  Goals are ongoing.  Therapist to assess STG as able next visit.    From initial evaluation: Patient is a 77 y.o. f who was seen today for physical therapy evaluation and treatment for Joint pain left hip, shoulders, and knees. 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 She presents today with main complaint of shoulder pain.  She had PRP which did not help.  She is unable to wash under her arms without a brush.  She reports she has all over body pain daily all day and is just recovering from a 2 week stint with  covid, feeling very week. She does live alone. She will benefit from skilled physical therapy to improve ROM and strength of extremities, decrease pain and Improve her functional  mobility and ADL's.  Will initially see her in aquatics to return her to her PLOF prior to covid then transition to land based intervention. She was going to the [facility] 3 x week for water exercise when feeling well.   She has not yet decided on having shoulder surgery.  OBJECTIVE IMPAIRMENTS: Abnormal gait, decreased activity tolerance, decreased balance, decreased mobility, decreased ROM, decreased strength, impaired flexibility, and pain.   ACTIVITY LIMITATIONS: carrying, lifting, bending,  sitting, standing, squatting, stairs, bathing, dressing, reach over head, and hygiene/grooming  PARTICIPATION LIMITATIONS: cleaning, laundry, shopping, and community activity  PERSONAL FACTORS: Age and Time since onset of injury/illness/exacerbation are also affecting patient's functional outcome.   REHAB POTENTIAL: Good  CLINICAL DECISION MAKING: Unstable/unpredictable  EVALUATION COMPLEXITY: High   GOALS: Goals reviewed with patient? Yes  SHORT TERM GOALS: Target date: 01/05/23  Pt will tolerate full aquatic sessions consistently without increase in pain and with improving function to demonstrate good toleration and effectiveness of intervention.  Baseline: Goal status: INITIAL  2.  Pt will amb to and from setting along with toleration full aquatic session to demonstrate progressing toleration to activity Baseline:  Goal status: INITIAL  3.  Pt will report return to grocery shopping Baseline: pick up or delivery Goal status: INITIAL  4.  Pt will complete tandem and SLS submerged x 20s unsupported Baseline:land based 3-4s  Goal status: INITIAL  5.  Pt will improve on 5 X STS test to <or= 15s to demonstrate improving functional lower extremity strength, transitional movements, and balance Baseline: 19.55 Goal  status: INITIAL    LONG TERM GOALS: Target date: 01/28/23  Pt to meet stated Foto Goal Baseline:  Goal status: INITIAL  2.  Pt will be able to wash under arms (personal goal) without brush Baseline: unable without brush Goal status: INITIAL  3.  Pt will improve left shoulder Flex and abd towards 90d for improved ability with ADLs Baseline:  Goal status: INITIAL  4.  Pt will improve left hip strength to contralateral side to improve gait pattern Baseline:  Goal status: INITIAL  5.  Pt will return to water aerobics at [facility] 3 x week Baseline: not going Goal status: INITIAL  6.  Pt will be indep with final HEP's (land and aquatic as appropriate) for continued management of condition Baseline:  Goal status: INITIAL  PLAN:  PT FREQUENCY: 1-2x/week  PT DURATION: 8 weeks  PLANNED INTERVENTIONS: Therapeutic exercises, Therapeutic activity, Neuromuscular re-education, Balance training, Gait training, Patient/Family education, Self Care, Joint mobilization, Joint manipulation, Stair training, Orthotic/Fit training, DME instructions, Aquatic Therapy, Dry Needling, Electrical stimulation, Cryotherapy, Moist heat, scar mobilization, Splintting, Taping, Ionotophoresis 4mg /ml Dexamethasone, Manual therapy, and Re-evaluation.  PLAN FOR NEXT SESSION: Aquatics pain relief, gentle movement/ROM and stretching LE, core and shoulders.  Mayer Camel, PTA 12/28/22 5:11 PM [facility] [address] [place], [place], [address] Phone: [phone]   Fax:  [phone]

## 2022-12-30 ENCOUNTER — Ambulatory Visit (HOSPITAL_BASED_OUTPATIENT_CLINIC_OR_DEPARTMENT_OTHER): Payer: Medicare Other | Admitting: Physical Therapy

## 2022-12-30 ENCOUNTER — Other Ambulatory Visit: Payer: Self-pay | Admitting: Physical Medicine and Rehabilitation

## 2022-12-30 ENCOUNTER — Encounter (HOSPITAL_BASED_OUTPATIENT_CLINIC_OR_DEPARTMENT_OTHER): Payer: Self-pay | Admitting: Physical Therapy

## 2022-12-30 DIAGNOSIS — M25511 Pain in right shoulder: Secondary | ICD-10-CM | POA: Diagnosis not present

## 2022-12-30 DIAGNOSIS — R262 Difficulty in walking, not elsewhere classified: Secondary | ICD-10-CM

## 2022-12-30 DIAGNOSIS — M25562 Pain in left knee: Secondary | ICD-10-CM | POA: Diagnosis not present

## 2022-12-30 DIAGNOSIS — G8929 Other chronic pain: Secondary | ICD-10-CM

## 2022-12-30 DIAGNOSIS — R7303 Prediabetes: Secondary | ICD-10-CM | POA: Diagnosis not present

## 2022-12-30 DIAGNOSIS — M25512 Pain in left shoulder: Secondary | ICD-10-CM | POA: Diagnosis not present

## 2022-12-30 DIAGNOSIS — E663 Overweight: Secondary | ICD-10-CM | POA: Diagnosis not present

## 2022-12-30 DIAGNOSIS — M1990 Unspecified osteoarthritis, unspecified site: Secondary | ICD-10-CM | POA: Diagnosis not present

## 2022-12-30 DIAGNOSIS — M25561 Pain in right knee: Secondary | ICD-10-CM | POA: Diagnosis not present

## 2022-12-30 DIAGNOSIS — M6281 Muscle weakness (generalized): Secondary | ICD-10-CM

## 2022-12-30 DIAGNOSIS — Z6827 Body mass index (BMI) 27.0-27.9, adult: Secondary | ICD-10-CM | POA: Diagnosis not present

## 2022-12-30 MED ORDER — TOPIRAMATE 50 MG PO TABS: 50.0000 mg | ORAL_TABLET | Freq: Every evening | ORAL | 3 refills | Status: DC

## 2022-12-30 MED ORDER — VITAMIN D (ERGOCALCIFEROL) 1.25 MG (50000 UNIT) PO CAPS: 50000.0000 [IU] | ORAL_CAPSULE | ORAL | 0 refills | Status: DC

## 2022-12-30 NOTE — Therapy (Signed)
OUTPATIENT PHYSICAL THERAPY THORACOLUMBAR TREATMENT   Patient Name: Morgan Reed MRN: [id number] DOB:[birth year], 77 y.o., female Today's Date: 12/30/2022  END OF SESSION:  PT End of Session - 12/30/22 1709     Visit Number 7    Number of Visits 12    Date for PT Re-Evaluation 01/28/23    Authorization Type MCR    Progress Note Due on Visit 10    PT Start Time 1700    PT Stop Time 1740    PT Time Calculation (min) 40 min    Activity Tolerance Patient limited by pain    Behavior During Therapy WFL for tasks assessed/performed              Past Medical History:  Diagnosis Date   Arthritis    knees, hands   Past Surgical History:  Procedure Laterality Date   ABDOMINAL HYSTERECTOMY  1995   complication with bowel tear   BREAST BIOPSY     right breast benign approx 1990   BREAST EXCISIONAL BIOPSY Right    right breast benign 1990   BUNIONECTOMY     EYE SURGERY Bilateral 2014   REPLACEMENT TOTAL KNEE Right 2013   TONSILLECTOMY     as a child   TOTAL KNEE REVISION Right 12/01/2020   Procedure: RIGHT TOTAL KNEE REVISION;  Surgeon: Gean Birchwood, MD;  Location: [facility];  Service: Orthopedics;  Laterality: Right;   Patient Active Problem List   Diagnosis Date Noted   Chronic pain of both shoulders 07/21/2022   DJD of both shoulders 07/21/2022   S/P revision of total knee, 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   Lupus anticoagulant positive 11/07/2020   Osteoarthritis 10/31/2020   Hyperlipidemia 10/31/2020   Obesity, Class I, BMI 30-34.9 10/31/2020   Osteopenia 10/31/2020   Insomnia 10/31/2020   Abnormal coagulation profile 10/31/2020   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) 10/31/2020    PCP: Wilfrid Lund, PA   REFERRING PROVIDER: Gearldine Bienenstock, PA-C   REFERRING DIAG:   Trochanteric bursitis, left hip  M25.561,M25.562,G89.29 (ICD-10-CM) - Chronic pain of both knees  M25.511,G89.29,M25.512 (ICD-10-CM) - Chronic pain of both shoulders    Rationale for  Evaluation and Treatment: Rehabilitation  THERAPY DIAG:  Chronic pain of both shoulders  Chronic pain of both knees  Difficulty in walking, not elsewhere classified  Muscle weakness (generalized)  ONSET DATE: multiped years  SUBJECTIVE:                                                                                                                                                                                           SUBJECTIVE STATEMENT: "  I feel like I've regressed since last session". Pt reports she has consultation with orthopedic surgeon on 11/18 regarding Lt TSA.   From initial evaluation:  Main problem is the pain in my shoulders. Doctors say they need to be replaced.  I feel so weak and tired they keep telling me my tests are just hints of Lupus but I have all the symptoms.  Had the PRP but only worked a few weeks in the shoulder. I was sick recently for 2 weeks had covid. I have arthritis everywhere. It takes me a couple hours to get going in the morning. I go to [employer] 3 x week with Erskine Squibb and the advanced arthritis program. Thought heated pool would help.  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 Fundamentally she needs shoulder replacement. She would like to avoid surgery and is looking for some other options. She had steroid injections about 6 weeks ago at [facility] orth[facility] which only lasted a week or 2.   Rheumatologist: positive ANA and lupus anticoagulant positive   PAIN:  Are you having pain? yes : NPRS scale: 9/10 Location: neck down to feet  Description: sharp    PRECAUTIONS: Shoulder and Fall  RED FLAGS: None   WEIGHT BEARING RESTRICTIONS: No  FALLS:  Has patient fallen in last 6 months? No  LIVING ENVIRONMENT: Lives with: lives alone Lives in: House/apartment Stairs: No Has following equipment at home: None  OCCUPATION: retired  PLOF: Independent  PATIENT GOALS: decrease pain,  increase energy  NEXT MD VISIT: oct  OBJECTIVE:   DIAGNOSTIC FINDINGS:  None recent in chart  PATIENT SURVEYS:  Foto   COGNITION: Overall cognitive status: Within functional limits for tasks assessed     SENSATION: WFL   POSTURE: rounded shoulders, forward head, increased thoracic kyphosis, and flexed trunk   PALPATION: TTP and muscle tightness throughout cervical spine and upper/middle traps    UPPER EXTREMITY ROM:  Active ROM Right eval Left eval  Shoulder flexion 90 45  Shoulder extension    Shoulder abduction 90 60  Shoulder adduction    Shoulder extension Pt refused due to pain Pt refused due to pain  Shoulder internal rotation FT to hips FT to hips  Shoulder external rotation Hand to shoulder Hand to shoulder  Elbow flexion    Elbow extension    Wrist flexion    Wrist extension    Wrist ulnar deviation    Wrist radial deviation    Wrist pronation    Wrist supination     (Blank rows = not tested)   LUMBAR ROM:   AROM eval  Flexion FT below patella  Extension neutral  Right lateral flexion 25%  Left lateral flexion 25%  Right rotation   Left rotation    (Blank rows = not tested)  LOWER EXTREMITY ROM:     Knee ROM WFL Left hip flex 100d  LOWER EXTREMITY MMT:    MMT Right eval Left eval  Hip flexion 4- 3+  Hip extension    Hip abduction 4- 4-  Hip adduction 4- 4-  Hip internal rotation    Hip external rotation    Knee flexion 4- 4-  Knee extension 4 4  Ankle dorsiflexion 5 5  Ankle plantarflexion 5 5  Ankle inversion    Ankle eversion     (Blank rows = not tested)    FUNCTIONAL TESTS:  5 times sit to stand: 17.31 Timed up and  go (TUG): 19.55 4 stage: Passed stage 1&2  Stage 3: tandem x 4s Stage 4: SLS x3s  GAIT: Distance walked: 400 ft Assistive device utilized: None Level of assistance: Complete Independence Comments: no arm swing, cadence slowed, shortened step length, posture guarded, of loading towards  right  TODAY'S TREATMENT:                                                                                                                              Pt seen for aquatic therapy today.  Treatment took place in water 3.5-4.75 ft in depth at the [facility] pool. Temp of water was 91.  Pt entered/exited the pool via stairs in step-to pattern independently with bilat rail.    * unsupported - walking forward / backward with arm swing, multiple laps  *side stepping x 4 widths with shoulder add/abd * marching with relaxed arms at side  *standard stance with elbow flex/ext 10; staggered stance with bilat horizontal abd x 10 * forward walking kicks 1 lap * L stretch for Lat stretch x 3 holding 10sec   * tricep push down at side with hollow short noodle x 5 reps each side * marching forward / backward with row motion with shoulders (hands on short hollow noodle) * solid noodle under arms (behind back) and cycling LEs, cross country ski  *Seated on lift: cycling; hip add/abd; LAQ; SLR 2 x 20 reps * return to walking forward / backward with reciprocal arm swing and side stepping with arm addct/ abdct * Tandem stance and SLS at 4 ft x 15s each * tandem gait forward/ backward      Pt requires the buoyancy and hydrostatic pressure of water for support, and to offload joints by unweighting joint load by at least 50 % in navel deep water and by at least 75-80% in chest to neck deep water.  Viscosity of the water is needed for resistance of strengthening. Water current perturbations provides challenge to standing balance requiring increased core activation.     PATIENT EDUCATION:  Education details: exercise rationale/ modifications Person educated: Patient Education method: Explanation Education comprehension: verbalized understanding  HOME EXERCISE PROGRAM: Pt goes to water aerobic (Less pain with Erskine Squibb) at [facility] x 3 weekly  (hasn't rejoined yet since illness)  ASSESSMENT:  CLINICAL  IMPRESSION: Pt unable to tolerate completion of land exercises given at home due to increased pain.  Pt reported gradual reduction of overall pain to 6/10 during session, with reduction of tightness.   SLS and tandem stance in 4 ft appear to not be a challenge; plan to trial these in shallower water next visit.   Therapist to assess STG as able next visit.    From initial evaluation: Patient is a 77 y.o. f who was seen today for physical therapy evaluation and treatment for Joint pain left hip, shoulders, and knees. She has symptoms of Lupus/autoimmune dysfunction (not fully dx), and bilateral shoulder Oa (pt trying  to avoid total shoulder replacements). She presents today with main complaint of shoulder pain.  She had PRP which did not help.  She is unable to wash under her arms without a brush.  She reports she has all over body pain daily all day and is just recovering from a 2 week stint with covid, feeling very week. She does live alone. She will benefit from skilled physical therapy to improve ROM and strength of extremities, decrease pain and Improve her functional  mobility and ADL's.  Will initially see her in aquatics to return her to her PLOF prior to covid then transition to land based intervention. She was going to the [facility] 3 x week for water exercise when feeling well.   She has not yet decided on having shoulder surgery.  OBJECTIVE IMPAIRMENTS: Abnormal gait, decreased activity tolerance, decreased balance, decreased mobility, decreased ROM, decreased strength, impaired flexibility, and pain.   ACTIVITY LIMITATIONS: carrying, lifting, bending, sitting, standing, squatting, stairs, bathing, dressing, reach over head, and hygiene/grooming  PARTICIPATION LIMITATIONS: cleaning, laundry, shopping, and community activity  PERSONAL FACTORS: Age and Time since onset of injury/illness/exacerbation are also affecting patient's functional outcome.   REHAB POTENTIAL: Good  CLINICAL DECISION  MAKING: Unstable/unpredictable  EVALUATION COMPLEXITY: High   GOALS: Goals reviewed with patient? Yes  SHORT TERM GOALS: Target date: 01/05/23  Pt will tolerate full aquatic sessions consistently without increase in pain and with improving function to demonstrate good toleration and effectiveness of intervention.  Baseline: Goal status: MET - 12/30/22  2.  Pt will amb to and from setting along with toleration full aquatic session to demonstrate progressing toleration to activity Baseline:  Goal status: INITIAL  3.  Pt will report return to grocery shopping Baseline: pick up or delivery Goal status: INITIAL  4.  Pt will complete tandem and SLS submerged x 20s unsupported Baseline:land based 3-4s  Goal status: INITIAL  5.  Pt will improve on 5 X STS test to <or= 15s to demonstrate improving functional lower extremity strength, transitional movements, and balance Baseline: 19.55 Goal status: INITIAL    LONG TERM GOALS: Target date: 01/28/23  Pt to meet stated Foto Goal Baseline:  Goal status: INITIAL  2.  Pt will be able to wash under arms (personal goal) without brush Baseline: unable without brush Goal status: INITIAL  3.  Pt will improve left shoulder Flex and abd towards 90d for improved ability with ADLs Baseline:  Goal status: INITIAL  4.  Pt will improve left hip strength to contralateral side to improve gait pattern Baseline:  Goal status: INITIAL  5.  Pt will return to water aerobics at [facility] 3 x week Baseline: not going Goal status: INITIAL  6.  Pt will be indep with final HEP's (land and aquatic as appropriate) for continued management of condition Baseline:  Goal status: INITIAL  PLAN:  PT FREQUENCY: 1-2x/week  PT DURATION: 8 weeks  PLANNED INTERVENTIONS: Therapeutic exercises, Therapeutic activity, Neuromuscular re-education, Balance training, Gait training, Patient/Family education, Self Care, Joint mobilization, Joint manipulation, Stair  training, Orthotic/Fit training, DME instructions, Aquatic Therapy, Dry Needling, Electrical stimulation, Cryotherapy, Moist heat, scar mobilization, Splintting, Taping, Ionotophoresis 4mg /ml Dexamethasone, Manual therapy, and Re-evaluation.  PLAN FOR NEXT SESSION: Aquatics pain relief, gentle movement/ROM and stretching LE, core and shoulders.  Mayer Camel, PTA 12/30/22 6:00 PM [facility] [address] [place], [place], [address] Phone: [phone]   Fax:  [phone]

## 2023-01-04 ENCOUNTER — Ambulatory Visit (HOSPITAL_BASED_OUTPATIENT_CLINIC_OR_DEPARTMENT_OTHER): Payer: Medicare Other | Admitting: Physical Therapy

## 2023-01-04 ENCOUNTER — Encounter (HOSPITAL_BASED_OUTPATIENT_CLINIC_OR_DEPARTMENT_OTHER): Payer: Self-pay | Admitting: Physical Therapy

## 2023-01-04 DIAGNOSIS — M25562 Pain in left knee: Secondary | ICD-10-CM | POA: Diagnosis not present

## 2023-01-04 DIAGNOSIS — R262 Difficulty in walking, not elsewhere classified: Secondary | ICD-10-CM | POA: Diagnosis not present

## 2023-01-04 DIAGNOSIS — M25511 Pain in right shoulder: Secondary | ICD-10-CM | POA: Diagnosis not present

## 2023-01-04 DIAGNOSIS — M25512 Pain in left shoulder: Secondary | ICD-10-CM | POA: Diagnosis not present

## 2023-01-04 DIAGNOSIS — M6281 Muscle weakness (generalized): Secondary | ICD-10-CM

## 2023-01-04 DIAGNOSIS — M25561 Pain in right knee: Secondary | ICD-10-CM | POA: Diagnosis not present

## 2023-01-04 DIAGNOSIS — G8929 Other chronic pain: Secondary | ICD-10-CM | POA: Diagnosis not present

## 2023-01-04 NOTE — Therapy (Signed)
OUTPATIENT PHYSICAL THERAPY THORACOLUMBAR TREATMENT   Patient Name: Morgan Reed MRN: [id number] DOB:1945/07/11, 77 y.o., female Today's Date: 01/04/2023  END OF SESSION:  PT End of Session - 01/04/23 1623     Visit Number 8    Number of Visits 12    Date for PT Re-Evaluation 01/28/23    Authorization Type MCR    Progress Note Due on Visit 10    PT Start Time 1618    PT Stop Time 1656    PT Time Calculation (min) 38 min              Past Medical History:  Diagnosis Date   Arthritis    knees, hands   Past Surgical History:  Procedure Laterality Date   ABDOMINAL HYSTERECTOMY  1995   complication with bowel tear   BREAST BIOPSY     right breast benign approx 1990   BREAST EXCISIONAL BIOPSY Right    right breast benign 1990   BUNIONECTOMY     EYE SURGERY Bilateral 2014   REPLACEMENT TOTAL KNEE Right 2013   TONSILLECTOMY     as a child   TOTAL KNEE REVISION Right 12/01/2020   Procedure: RIGHT TOTAL KNEE REVISION;  Surgeon: Gean Birchwood, MD;  Location: [facility];  Service: Orthopedics;  Laterality: Right;   Patient Active Problem List   Diagnosis Date Noted   Chronic pain of both shoulders 07/21/2022   DJD of both shoulders 07/21/2022   S/P revision of total knee, 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   Lupus anticoagulant positive 11/07/2020   Osteoarthritis 10/31/2020   Hyperlipidemia 10/31/2020   Obesity, Class I, BMI 30-34.9 10/31/2020   Osteopenia 10/31/2020   Insomnia 10/31/2020   Abnormal coagulation profile 10/31/2020   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) 10/31/2020    PCP: Wilfrid Lund, PA   REFERRING PROVIDER: Gearldine Bienenstock, PA-C   REFERRING DIAG:   Trochanteric bursitis, left hip  M25.561,M25.562,G89.29 (ICD-10-CM) - Chronic pain of both knees  M25.511,G89.29,M25.512 (ICD-10-CM) - Chronic pain of both shoulders    Rationale for Evaluation and Treatment: Rehabilitation  THERAPY DIAG:  Chronic pain of both shoulders  Chronic pain of  both knees  Difficulty in walking, not elsewhere classified  Muscle weakness (generalized)  ONSET DATE: multiped years  SUBJECTIVE:                                                                                                                                                                                           SUBJECTIVE STATEMENT: "I'm so tired of feeling weak and in pain" . Pt reports she has consultation with orthopedic surgeon on  11/11 regarding Lt TSA.   From initial evaluation:  Main problem is the pain in my shoulders. Doctors say they need to be replaced.  I feel so weak and tired they keep telling me my tests are just hints of Lupus but I have all the symptoms.  Had the PRP but only worked a few weeks in the shoulder. I was sick recently for 2 weeks had covid. I have arthritis everywhere. It takes me a couple hours to get going in the morning. I go to [employer] 3 x week with Erskine Squibb and the advanced arthritis program. Thought heated pool would help.  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 Fundamentally she needs shoulder replacement. She would like to avoid surgery and is looking for some other options. She had steroid injections about 6 weeks ago at [facility] orthopedics which only lasted a week or 2.   Rheumatologist: positive ANA and lupus anticoagulant positive   PAIN:  Are you having pain? yes : NPRS scale: 9/10 Location: neck down to feet  Description: sharp    PRECAUTIONS: Shoulder and Fall  RED FLAGS: None   WEIGHT BEARING RESTRICTIONS: No  FALLS:  Has patient fallen in last 6 months? No  LIVING ENVIRONMENT: Lives with: lives alone Lives in: House/apartment Stairs: No Has following equipment at home: None  OCCUPATION: retired  PLOF: Independent  PATIENT GOALS: decrease pain, increase energy  NEXT MD VISIT: oct  OBJECTIVE:   DIAGNOSTIC FINDINGS:  None recent in chart  PATIENT  SURVEYS:  Foto   COGNITION: Overall cognitive status: Within functional limits for tasks assessed     SENSATION: WFL   POSTURE: rounded shoulders, forward head, increased thoracic kyphosis, and flexed trunk   PALPATION: TTP and muscle tightness throughout cervical spine and upper/middle traps    UPPER EXTREMITY ROM:  Active ROM Right eval Left eval  Shoulder flexion 90 45  Shoulder extension    Shoulder abduction 90 60  Shoulder adduction    Shoulder extension Pt refused due to pain Pt refused due to pain  Shoulder internal rotation FT to hips FT to hips  Shoulder external rotation Hand to shoulder Hand to shoulder  Elbow flexion    Elbow extension    Wrist flexion    Wrist extension    Wrist ulnar deviation    Wrist radial deviation    Wrist pronation    Wrist supination     (Blank rows = not tested)   LUMBAR ROM:   AROM eval  Flexion FT below patella  Extension neutral  Right lateral flexion 25%  Left lateral flexion 25%  Right rotation   Left rotation    (Blank rows = not tested)  LOWER EXTREMITY ROM:     Knee ROM WFL Left hip flex 100d  LOWER EXTREMITY MMT:    MMT Right eval Left eval  Hip flexion 4- 3+  Hip extension    Hip abduction 4- 4-  Hip adduction 4- 4-  Hip internal rotation    Hip external rotation    Knee flexion 4- 4-  Knee extension 4 4  Ankle dorsiflexion 5 5  Ankle plantarflexion 5 5  Ankle inversion    Ankle eversion     (Blank rows = not tested)    FUNCTIONAL TESTS:  5 times sit to stand: 17.31 Timed up and go (TUG): 19.55 4 stage: Passed stage 1&2  Stage 3: tandem x 4s Stage 4: SLS  x3s  GAIT: Distance walked: 400 ft Assistive device utilized: None Level of assistance: Complete Independence Comments: no arm swing, cadence slowed, shortened step length, posture guarded, of loading towards right  TODAY'S TREATMENT:                                                                                                                               Pt seen for aquatic therapy today.  Treatment took place in water 3.5-4.75 ft in depth at the [facility] pool. Temp of water was 91.  Pt entered/exited the pool via stairs in step-to pattern independently with bilat rail.    * unsupported - walking forward / backward with arm swing, multiple laps  *side stepping x 4 widths with shoulder add/abd * SLS x 1 RLE, x 3 LLE up to 20s;  tandem stance up to 20 each LE *standard stance with elbow flex/ext 10; staggered stance with bilat horizontal abd x 10 * forward marching with  * L stretch for Lat stretch x 3 holding 10sec   * tricep push down at side with hollow short noodle x 5 reps each side * marching forward / backward with row motion with shoulders (hands on short hollow noodle) * solid noodle under arms (behind back) and cycling LEs, jumping jack LEs, cross country ski  * bilat overhead reach holding short hollow noodle x 5 * marching with row motion holding noodle - 2 laps * hip openers x 10 each     Pt requires the buoyancy and hydrostatic pressure of water for support, and to offload joints by unweighting joint load by at least 50 % in navel deep water and by at least 75-80% in chest to neck deep water.  Viscosity of the water is needed for resistance of strengthening. Water current perturbations provides challenge to standing balance requiring increased core activation.     PATIENT EDUCATION:  Education details: exercise rationale/ modifications Person educated: Patient Education method: Explanation Education comprehension: verbalized understanding  HOME EXERCISE PROGRAM: Pt goes to water aerobic (Less pain with Erskine Squibb) at [facility] x 3 weekly  (hasn't rejoined yet since illness)  ASSESSMENT:  CLINICAL IMPRESSION:  Pt reported gradual reduction of overall pain to 3/10 during session, with reduction of tightness. Therapist to assess goals and discharge planning. consider assigning aquatic HEP as  she plans to return to [facility] next week.    From initial evaluation: Patient is a 77 y.o. f who was seen today for physical therapy evaluation and treatment for Joint pain left hip, shoulders, and knees. 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 She presents today with main complaint of shoulder pain.  She had PRP which did not help.  She is unable to wash under her arms without a brush.  She reports she has all over body pain daily all day and is just recovering from a 2 week  stint with covid, feeling very week. She does live alone. She will benefit from skilled physical therapy to improve ROM and strength of extremities, decrease pain and Improve her functional  mobility and ADL's.  Will initially see her in aquatics to return her to her PLOF prior to covid then transition to land based intervention. She was going to the [facility] 3 x week for water exercise when feeling well.   She has not yet decided on having shoulder surgery.  OBJECTIVE IMPAIRMENTS: Abnormal gait, decreased activity tolerance, decreased balance, decreased mobility, decreased ROM, decreased strength, impaired flexibility, and pain.   ACTIVITY LIMITATIONS: carrying, lifting, bending, sitting, standing, squatting, stairs, bathing, dressing, reach over head, and hygiene/grooming  PARTICIPATION LIMITATIONS: cleaning, laundry, shopping, and community activity  PERSONAL FACTORS: Age and Time since onset of injury/illness/exacerbation are also affecting patient's functional outcome.   REHAB POTENTIAL: Good  CLINICAL DECISION MAKING: Unstable/unpredictable  EVALUATION COMPLEXITY: High   GOALS: Goals reviewed with patient? Yes  SHORT TERM GOALS: Target date: 01/05/23  Pt will tolerate full aquatic sessions consistently without increase in pain and with improving function to demonstrate good toleration and effectiveness of intervention.   Baseline: Goal status: MET - 12/30/22  2.  Pt will amb to and from setting along with toleration full aquatic session to demonstrate progressing toleration to activity Baseline:  Goal status: INITIAL  3.  Pt will report return to grocery shopping Baseline: pick up or delivery Goal status: deferred -no interested in returning to grocery shopping 01/04/23  4.  Pt will complete tandem and SLS submerged x 20s unsupported Baseline:land based 3-4s at eval;   Goal status: INITIAL  5.  Pt will improve on 5 X STS test to <or= 15s to demonstrate improving functional lower extremity strength, transitional movements, and balance Baseline: 19.55 Goal status: INITIAL    LONG TERM GOALS: Target date: 01/28/23  Pt to meet stated Foto Goal Baseline:  Goal status: INITIAL  2.  Pt will be able to wash under arms (personal goal) without brush Baseline: unable without brush Goal status: INITIAL  3.  Pt will improve left shoulder Flex and abd towards 90d for improved ability with ADLs Baseline:  Goal status: INITIAL  4.  Pt will improve left hip strength to contralateral side to improve gait pattern Baseline:  Goal status: INITIAL  5.  Pt will return to water aerobics at [facility] 3 x week Baseline: not going Goal status: INITIAL  6.  Pt will be indep with final HEP's (land and aquatic as appropriate) for continued management of condition Baseline:  Goal status: INITIAL  PLAN:  PT FREQUENCY: 1-2x/week  PT DURATION: 8 weeks  PLANNED INTERVENTIONS: Therapeutic exercises, Therapeutic activity, Neuromuscular re-education, Balance training, Gait training, Patient/Family education, Self Care, Joint mobilization, Joint manipulation, Stair training, Orthotic/Fit training, DME instructions, Aquatic Therapy, Dry Needling, Electrical stimulation, Cryotherapy, Moist heat, scar mobilization, Splintting, Taping, Ionotophoresis 4mg /ml Dexamethasone, Manual therapy, and Re-evaluation.  PLAN FOR NEXT  SESSION: Aquatics pain relief, gentle movement/ROM and stretching LE, core and shoulders.  Mayer Camel, PTA 01/04/23 5:59 PM [facility] [address] [place], [place], [address] Phone: [phone]   Fax:  [phone]

## 2023-01-06 ENCOUNTER — Encounter (HOSPITAL_BASED_OUTPATIENT_CLINIC_OR_DEPARTMENT_OTHER): Payer: Self-pay | Admitting: Physical Therapy

## 2023-01-06 ENCOUNTER — Ambulatory Visit (HOSPITAL_BASED_OUTPATIENT_CLINIC_OR_DEPARTMENT_OTHER): Payer: Medicare Other | Admitting: Physical Therapy

## 2023-01-06 DIAGNOSIS — M6281 Muscle weakness (generalized): Secondary | ICD-10-CM

## 2023-01-06 DIAGNOSIS — R262 Difficulty in walking, not elsewhere classified: Secondary | ICD-10-CM | POA: Diagnosis not present

## 2023-01-06 DIAGNOSIS — M25562 Pain in left knee: Secondary | ICD-10-CM | POA: Diagnosis not present

## 2023-01-06 DIAGNOSIS — G8929 Other chronic pain: Secondary | ICD-10-CM

## 2023-01-06 DIAGNOSIS — M25511 Pain in right shoulder: Secondary | ICD-10-CM | POA: Diagnosis not present

## 2023-01-06 DIAGNOSIS — M25561 Pain in right knee: Secondary | ICD-10-CM | POA: Diagnosis not present

## 2023-01-06 DIAGNOSIS — M25512 Pain in left shoulder: Secondary | ICD-10-CM | POA: Diagnosis not present

## 2023-01-06 NOTE — Therapy (Signed)
OUTPATIENT PHYSICAL THERAPY THORACOLUMBAR TREATMENT Progress Note Reporting Period 12/06/22 to 01/06/23  See note below for Objective Data and Assessment of Progress/Goals.      Patient Name: Morgan Reed MRN: [id number] DOB:08/17/1945, 77 y.o., female Today's Date: 01/06/2023  END OF SESSION:  PT End of Session - 01/06/23 1710     Visit Number 9    Number of Visits 12    Date for PT Re-Evaluation 01/28/23    Authorization Type MCR    Progress Note Due on Visit 10    PT Start Time 1615    PT Stop Time 1700    PT Time Calculation (min) 45 min    Activity Tolerance Patient limited by pain;Patient tolerated treatment well    Behavior During Therapy WFL for tasks assessed/performed               Past Medical History:  Diagnosis Date   Arthritis    knees, hands   Past Surgical History:  Procedure Laterality Date   ABDOMINAL HYSTERECTOMY  1995   complication with bowel tear   BREAST BIOPSY     right breast benign approx 1990   BREAST EXCISIONAL BIOPSY Right    right breast benign 1990   BUNIONECTOMY     EYE SURGERY Bilateral 2014   REPLACEMENT TOTAL KNEE Right 2013   TONSILLECTOMY     as a child   TOTAL KNEE REVISION Right 12/01/2020   Procedure: RIGHT TOTAL KNEE REVISION;  Surgeon: Gean Birchwood, MD;  Location: [facility];  Service: Orthopedics;  Laterality: Right;   Patient Active Problem List   Diagnosis Date Noted   Chronic pain of both shoulders 07/21/2022   DJD of both shoulders 07/21/2022   S/P revision of total knee, 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   Lupus anticoagulant positive 11/07/2020   Osteoarthritis 10/31/2020   Hyperlipidemia 10/31/2020   Obesity, Class I, BMI 30-34.9 10/31/2020   Osteopenia 10/31/2020   Insomnia 10/31/2020   Abnormal coagulation profile 10/31/2020   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) 10/31/2020    PCP: Wilfrid Lund, PA   REFERRING PROVIDER: Gearldine Bienenstock, PA-C   REFERRING DIAG:   Trochanteric bursitis, left  hip  M25.561,M25.562,G89.29 (ICD-10-CM) - Chronic pain of both knees  M25.511,G89.29,M25.512 (ICD-10-CM) - Chronic pain of both shoulders    Rationale for Evaluation and Treatment: Rehabilitation  THERAPY DIAG:  Chronic pain of both shoulders  Chronic pain of both knees  Difficulty in walking, not elsewhere classified  Muscle weakness (generalized)  ONSET DATE: multiped years  SUBJECTIVE:  SUBJECTIVE STATEMENT: "I think I am going to go ahead with the shoulder surgery.  I am tired of the pain"  From initial evaluation:  Main problem is the pain in my shoulders. Doctors say they need to be replaced.  I feel so weak and tired they keep telling me my tests are just hints of Lupus but I have all the symptoms.  Had the PRP but only worked a few weeks in the shoulder. I was sick recently for 2 weeks had covid. I have arthritis everywhere. It takes me a couple hours to get going in the morning. I go to [facility] 3 x week with Erskine Squibb and the advanced arthritis program. Thought heated pool would help.  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 Fundamentally she needs shoulder replacement. She would like to avoid surgery and is looking for some other options. She had steroid injections about 6 weeks ago at [facility] orthoped[facility] which only lasted a week or 2.   Rheumatologist: positive ANA and lupus anticoagulant positive   PAIN:  Are you having pain? yes : NPRS scale: 6/10 Location: neck down to feet including shoulders Description: sharp    PRECAUTIONS: Shoulder and Fall  RED FLAGS: None   WEIGHT BEARING RESTRICTIONS: No  FALLS:  Has patient fallen in last 6 months? No  LIVING ENVIRONMENT: Lives with: lives alone Lives in:  House/apartment Stairs: No Has following equipment at home: None  OCCUPATION: retired  PLOF: Independent  PATIENT GOALS: decrease pain, increase energy  NEXT MD VISIT: oct  OBJECTIVE:   DIAGNOSTIC FINDINGS:  None recent in chart  PATIENT SURVEYS:  12/06/22: Foto primary measure of 42% with goal of 59%  01/06/23: 47%  COGNITION: Overall cognitive status: Within functional limits for tasks assessed     SENSATION: WFL   POSTURE: rounded shoulders, forward head, increased thoracic kyphosis, and flexed trunk   PALPATION: TTP and muscle tightness throughout cervical spine and upper/middle traps    UPPER EXTREMITY ROM:  Active ROM Right eval Left eval R /L 01/06/23  Shoulder flexion 90 45 92 /58  Shoulder extension     Shoulder abduction 90 60 90 / 64  Shoulder adduction     Shoulder extension Pt refused due to pain Pt refused due to pain   Shoulder internal rotation FT to hips FT to hips   Shoulder external rotation Hand to shoulder Hand to shoulder   Elbow flexion     Elbow extension     Wrist flexion     Wrist extension     Wrist ulnar deviation     Wrist radial deviation     Wrist pronation     Wrist supination      (Blank rows = not tested)   LUMBAR ROM:   AROM eval 01/06/23  Flexion FT below patella   Extension neutral 25%  Right lateral flexion 25% 75%  Left lateral flexion 25% 75%  Right rotation    Left rotation     (Blank rows = not tested)  LOWER EXTREMITY ROM:     Knee ROM WFL Left hip flex 100d  LOWER EXTREMITY MMT:    MMT Right eval Left eval L / R 01/06/23  Hip flexion 4- 3+ 4+ / 4  Hip extension     Hip abduction 4- 4- 5 / 5  Hip adduction 4- 4- 5 / 5  Hip internal rotation     Hip external rotation  Knee flexion 4- 4- 4 / 4  Knee extension 4 4 5  / 5  Ankle dorsiflexion 5 5   Ankle plantarflexion 5 5   Ankle inversion     Ankle eversion      (Blank rows = not tested)    FUNCTIONAL TESTS:  5 times sit to stand:  17.31 Timed up and go (TUG): 19.55 4 stage: Passed stage 1&2  Stage 3: tandem x 4s Stage 4: SLS x3s  01/06/23: 5x STS: 13.59  GAIT: Distance walked: 400 ft Assistive device utilized: None Level of assistance: Complete Independence Comments: no arm swing, cadence slowed, shortened step length, posture guarded, of loading towards right  TODAY'S TREATMENT:         Testing  Foto 5x STS                                                                                                                       Pt seen for aquatic therapy today.  Treatment took place in water 3.5-4.75 ft in depth at the [facility] pool. Temp of water was 91.  Pt entered/exited the pool via stairs in step-to pattern independently with bilat rail.    * unsupported - walking forward / backward with arm swing, multiple laps  *side stepping x 4 widths with shoulder add/abd *standing horizontal add/and using rainbow HB; row * tricep push down at side with hollow short noodle x 5 reps each side * bilat overhead reach holding short hollow noodle x 5 * Standing ue support HB: df; pf; high knee marching; relaxed squats * hip openers x 10 each ue support on hand rails  Pt requires the buoyancy and hydrostatic pressure of water for support, and to offload joints by unweighting joint load by at least 50 % in navel deep water and by at least 75-80% in chest to neck deep water.  Viscosity of the water is needed for resistance of strengthening. Water current perturbations provides challenge to standing balance requiring increased core activation.     PATIENT EDUCATION:  Education details: exercise rationale/ modifications Person educated: Patient Education method: Explanation Education comprehension: verbalized understanding  HOME EXERCISE PROGRAM: Pt goes to water aerobic (Less pain with Erskine Squibb) at [facility] x 3 weekly  (hasn't rejoined yet since illness)  ASSESSMENT:  CLINICAL IMPRESSION: Goals addressed and PN  complete: Pt has made good progress with skilled PT intervention.  ROM of shoulder and LE strength improved as noted in chart above. Foto improved.  She has improved on 5x STS test demonstrating improved functional lower extremity strength, transitional movements, and balance.  Pt has returned to doing some shopping with improving toleration to activity but does limit herself to one activity a day. She reports continued difficulty reaching under arm to wash and continues to use brush. Has expressed frustration with level of shoulder pain and has decided to return to MD for possible progression toward surgical intervention. Pt to return tomorrow to the [facility] and complete a water program designed  to decrease pain. Plan to assign and issue aquatic HEP next session then ensure indep prior to DC. She does have reduction of pain with water therapy intervention.    From initial evaluation: Patient is a 77 y.o. f who was seen today for physical therapy evaluation and treatment for Joint pain left hip, shoulders, and knees. 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 She presents today with main complaint of shoulder pain.  She had PRP which did not help.  She is unable to wash under her arms without a brush.  She reports she has all over body pain daily all day and is just recovering from a 2 week stint with covid, feeling very week. She does live alone. She will benefit from skilled physical therapy to improve ROM and strength of extremities, decrease pain and Improve her functional  mobility and ADL's.  Will initially see her in aquatics to return her to her PLOF prior to covid then transition to land based intervention. She was going to the [facility] 3 x week for water exercise when feeling well.   She has not yet decided on having shoulder surgery.  OBJECTIVE IMPAIRMENTS: Abnormal gait, decreased activity tolerance,  decreased balance, decreased mobility, decreased ROM, decreased strength, impaired flexibility, and pain.   ACTIVITY LIMITATIONS: carrying, lifting, bending, sitting, standing, squatting, stairs, bathing, dressing, reach over head, and hygiene/grooming  PARTICIPATION LIMITATIONS: cleaning, laundry, shopping, and community activity  PERSONAL FACTORS: Age and Time since onset of injury/illness/exacerbation are also affecting patient's functional outcome.   REHAB POTENTIAL: Good  CLINICAL DECISION MAKING: Unstable/unpredictable  EVALUATION COMPLEXITY: High   GOALS: Goals reviewed with patient? Yes  SHORT TERM GOALS: Target date: 01/05/23  Pt will tolerate full aquatic sessions consistently without increase in pain and with improving function to demonstrate good toleration and effectiveness of intervention.  Baseline: Goal status: MET - 12/30/22  2.  Pt will amb to and from setting along with toleration full aquatic session to demonstrate progressing toleration to activity Baseline:  Goal status: Met 01/06/23  3.  Pt will report return to grocery shopping Baseline: pick up or delivery Goal status: deferred -no interested in returning to grocery shopping 01/04/23  4.  Pt will complete tandem and SLS submerged x 20s unsupported Baseline:land based 3-4s at eval;   Goal status: In progress 01/06/23  5.  Pt will improve on 5 X STS test to <or= 15s to demonstrate improving functional lower extremity strength, transitional movements, and balance Baseline: 19.55; 13.59 Goal status: Met 01/06/23    LONG TERM GOALS: Target date: 01/28/23  Pt to meet stated Foto Goal Baseline:  Goal status: in progress  2.  Pt will be able to wash under arms (personal goal) without brush Baseline: unable without brush Goal status: In progress 01/06/23  3.  Pt will improve left shoulder Flex and abd towards 90d for improved ability with ADLs Baseline:  Goal status: In progress 01/06/23  4.  Pt  will improve left hip strength to contralateral side to improve gait pattern Baseline:  Goal status: Met 01/06/23  5.  Pt will return to water aerobics at [facility] 3 x week Baseline: not going Goal status: In progress 01/06/23  6.  Pt will be indep with final HEP's (land and aquatic as appropriate) for continued management of condition Baseline:  Goal status: INITIAL  PLAN:  PT FREQUENCY: 1-2x/week  PT DURATION: 8 weeks  PLANNED INTERVENTIONS: Therapeutic  exercises, Therapeutic activity, Neuromuscular re-education, Balance training, Gait training, Patient/Family education, Self Care, Joint mobilization, Joint manipulation, Stair training, Orthotic/Fit training, DME instructions, Aquatic Therapy, Dry Needling, Electrical stimulation, Cryotherapy, Moist heat, scar mobilization, Splintting, Taping, Ionotophoresis 4mg /ml Dexamethasone, Manual therapy, and Re-evaluation.  PLAN FOR NEXT SESSION: Aquatics pain relief, gentle movement/ROM and stretching LE, core and shoulders.  [address] [place]) Oliwia Berzins MPT 01/06/23 5:11 PM [facility] [address] [place], [place], [address] Phone: [phone]   Fax:  [phone]

## 2023-01-11 ENCOUNTER — Encounter (HOSPITAL_BASED_OUTPATIENT_CLINIC_OR_DEPARTMENT_OTHER): Payer: Medicare Other | Admitting: Physical Therapy

## 2023-01-13 ENCOUNTER — Encounter (HOSPITAL_BASED_OUTPATIENT_CLINIC_OR_DEPARTMENT_OTHER): Payer: Self-pay | Admitting: Physical Therapy

## 2023-01-13 ENCOUNTER — Ambulatory Visit (HOSPITAL_BASED_OUTPATIENT_CLINIC_OR_DEPARTMENT_OTHER): Payer: Medicare Other | Attending: Physician Assistant | Admitting: Physical Therapy

## 2023-01-13 DIAGNOSIS — M6281 Muscle weakness (generalized): Secondary | ICD-10-CM | POA: Diagnosis not present

## 2023-01-13 DIAGNOSIS — M25512 Pain in left shoulder: Secondary | ICD-10-CM | POA: Insufficient documentation

## 2023-01-13 DIAGNOSIS — M25561 Pain in right knee: Secondary | ICD-10-CM | POA: Insufficient documentation

## 2023-01-13 DIAGNOSIS — M25511 Pain in right shoulder: Secondary | ICD-10-CM | POA: Diagnosis not present

## 2023-01-13 DIAGNOSIS — M25562 Pain in left knee: Secondary | ICD-10-CM | POA: Diagnosis not present

## 2023-01-13 DIAGNOSIS — R262 Difficulty in walking, not elsewhere classified: Secondary | ICD-10-CM | POA: Diagnosis not present

## 2023-01-13 DIAGNOSIS — G8929 Other chronic pain: Secondary | ICD-10-CM | POA: Diagnosis not present

## 2023-01-13 NOTE — Therapy (Signed)
OUTPATIENT PHYSICAL THERAPY THORACOLUMBAR TREATMENT  PHYSICAL THERAPY DISCHARGE SUMMARY  Visits from Start of Care: 10  Current functional level related to goals / functional outcomes: Limited due to chronic shoulder pain   Remaining deficits: Shoulder dysfunction   Education / Equipment: Management of condition/ HEP   Patient agrees to discharge. Patient goals were partially met. Patient is being discharged due to maximized rehab potential.      Patient Name: Morgan Reed MRN: [id number] DOB:08/18/1945, 77 y.o., female Today's Date: 01/13/2023  END OF SESSION:  PT End of Session - 01/13/23 1609     Visit Number 10    Number of Visits 12    Date for PT Re-Evaluation 01/28/23    Authorization Type MCR    Progress Note Due on Visit 19    PT Start Time 1615    PT Stop Time 1700    PT Time Calculation (min) 45 min    Activity Tolerance Patient limited by pain;Patient tolerated treatment well    Behavior During Therapy WFL for tasks assessed/performed               Past Medical History:  Diagnosis Date   Arthritis    knees, hands   Past Surgical History:  Procedure Laterality Date   ABDOMINAL HYSTERECTOMY  1995   complication with bowel tear   BREAST BIOPSY     right breast benign approx 1990   BREAST EXCISIONAL BIOPSY Right    right breast benign 1990   BUNIONECTOMY     EYE SURGERY Bilateral 2014   REPLACEMENT TOTAL KNEE Right 2013   TONSILLECTOMY     as a child   TOTAL KNEE REVISION Right 12/01/2020   Procedure: RIGHT TOTAL KNEE REVISION;  Surgeon: Gean Birchwood, MD;  Location: [facility];  Service: Orthopedics;  Laterality: Right;   Patient Active Problem List   Diagnosis Date Noted   Chronic pain of both shoulders 07/21/2022   DJD of both shoulders 07/21/2022   S/P revision of total knee, 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   Lupus anticoagulant positive 11/07/2020   Osteoarthritis 10/31/2020   Hyperlipidemia 10/31/2020   Obesity, Class I, BMI 30-34.9 10/31/2020    Osteopenia 10/31/2020   Insomnia 10/31/2020   Abnormal coagulation profile 10/31/2020   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) 10/31/2020    PCP: Wilfrid Lund, PA   REFERRING PROVIDER: Gearldine Bienenstock, PA-C   REFERRING DIAG:   Trochanteric bursitis, left hip  M25.561,M25.562,G89.29 (ICD-10-CM) - Chronic pain of both knees  M25.511,G89.29,M25.512 (ICD-10-CM) - Chronic pain of both shoulders    Rationale for Evaluation and Treatment: Rehabilitation  THERAPY DIAG:  Chronic pain of both shoulders  Chronic pain of both knees  Difficulty in walking, not elsewhere classified  Muscle weakness (generalized)  ONSET DATE: multiped years  SUBJECTIVE:  SUBJECTIVE STATEMENT: "See shoulder MD next week.  Will have shoulder surgery as soon as he can do it"  From initial evaluation:  Main problem is the pain in my shoulders. Doctors say they need to be replaced.  I feel so weak and tired they keep telling me my tests are just hints of Lupus but I have all the symptoms.  Had the PRP but only worked a few weeks in the shoulder. I was sick recently for 2 weeks had covid. I have arthritis everywhere. It takes me a couple hours to get going in the morning. I go to [facility] 3 x week with Erskine Squibb and the advanced arthritis program. Thought heated pool would help.  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 Fundamentally she needs shoulder replacement. She would like to avoid surgery and is looking for some other options. She had steroid injections about 6 weeks ago at [facility] orthopedics which only lasted a week or 2.   Rheumatologist: positive ANA and lupus anticoagulant positive   PAIN:  Are you having pain? yes : NPRS scale:  6/10 Location: neck down to feet including shoulders Description: sharp    PRECAUTIONS: Shoulder and Fall  RED FLAGS: None   WEIGHT BEARING RESTRICTIONS: No  FALLS:  Has patient fallen in last 6 months? No  LIVING ENVIRONMENT: Lives with: lives alone Lives in: House/apartment Stairs: No Has following equipment at home: None  OCCUPATION: retired  PLOF: Independent  PATIENT GOALS: decrease pain, increase energy  NEXT MD VISIT: oct  OBJECTIVE:   DIAGNOSTIC FINDINGS:  None recent in chart  PATIENT SURVEYS:  12/06/22: Foto primary measure of 42% with goal of 59%  01/06/23: 47%  COGNITION: Overall cognitive status: Within functional limits for tasks assessed     SENSATION: WFL   POSTURE: rounded shoulders, forward head, increased thoracic kyphosis, and flexed trunk   PALPATION: TTP and muscle tightness throughout cervical spine and upper/middle traps    UPPER EXTREMITY ROM:  Active ROM Right eval Left eval R /L 01/06/23  Shoulder flexion 90 45 92 /58  Shoulder extension     Shoulder abduction 90 60 90 / 64  Shoulder adduction     Shoulder extension Pt refused due to pain Pt refused due to pain   Shoulder internal rotation FT to hips FT to hips   Shoulder external rotation Hand to shoulder Hand to shoulder   Elbow flexion     Elbow extension     Wrist flexion     Wrist extension     Wrist ulnar deviation     Wrist radial deviation     Wrist pronation     Wrist supination      (Blank rows = not tested)   LUMBAR ROM:   AROM eval 01/06/23  Flexion FT below patella   Extension neutral 25%  Right lateral flexion 25% 75%  Left lateral flexion 25% 75%  Right rotation    Left rotation     (Blank rows = not tested)  LOWER EXTREMITY ROM:     Knee ROM WFL Left hip flex 100d  LOWER EXTREMITY MMT:    MMT Right eval Left eval L / R 01/06/23  Hip flexion 4- 3+ 4+ / 4  Hip extension     Hip abduction 4- 4- 5 / 5  Hip adduction 4- 4- 5 / 5   Hip internal rotation     Hip external rotation  Knee flexion 4- 4- 4 / 4  Knee extension 4 4 5  / 5  Ankle dorsiflexion 5 5   Ankle plantarflexion 5 5   Ankle inversion     Ankle eversion      (Blank rows = not tested)    FUNCTIONAL TESTS:  5 times sit to stand: 17.31 Timed up and go (TUG): 19.55 4 stage: Passed stage 1&2  Stage 3: tandem x 4s Stage 4: SLS x3s  01/06/23: 5x STS: 13.59  GAIT: Distance walked: 400 ft Assistive device utilized: None Level of assistance: Complete [place] Comments: no arm swing, cadence slowed, shortened step length, posture guarded, of loading towards right  TODAY'S TREATMENT:                                                                                                                             Pt seen for aquatic therapy today.  Treatment took place in water 3.5-4.75 ft in depth at the [facility] pool. Temp of water was 91.  Pt entered/exited the pool via stairs in step-to pattern independently with bilat rail.    Exercises - Walking  - Side Stepping  - Standing Shoulder Horizontal Abduction with Resistance   - Warrior I in [employer] with Pool Noodle  - Standing Hip Abduction Adduction at [employer]   - Standing Hip Flexion Extension at [employer]   - Heel Toe Raises at [employer]  - Standing March at [facility]   - Single Leg Stance    Pt requires the buoyancy and hydrostatic pressure of water for support, and to offload joints by unweighting joint load by at least 50 % in navel deep water and by at least 75-80% in chest to neck deep water.  Viscosity of the water is needed for resistance of strengthening. Water current perturbations provides challenge to standing balance requiring increased core activation.     PATIENT EDUCATION:  Education details: exercise rationale/ modifications Person educated: Patient Education method: Explanation Education comprehension: verbalized understanding  HOME EXERCISE  PROGRAM: Pt goes to water aerobic (Less pain with Erskine Squibb) at [facility] x 3 weekly  (hasn't rejoined yet since illness)  Access Code: [email or web address] URL: [email or web address] Date: 01/13/2023 Prepared by: Geni Bers  Exercises - Walking  - Side Stepping  - Standing Shoulder Horizontal Abduction with Resistance  - 1 x daily - 1-3 x weekly - 1-2 sets - 10 reps - Warrior I in [employer] with [employer]  - 1 x daily - 1-3 x weekly - 1-2 sets - 5 reps - Standing Hip Abduction Adduction at [employer]  - 1 x daily - 3 sets - 10 reps - Standing Hip Flexion Extension at [employer]  - 1 x daily - 1-3 x weekly - 3 sets - 5-10 reps - Heel Toe Raises at Pool Wall  - 1 x daily - 1-3 x weekly - 1-2 sets - 5-10 reps - Standing March at [facility]  -  1 x daily - 1-3 x weekly - 3 sets - 10 reps - Single Leg Stance  - 1 x daily - 1-3 x weekly  ASSESSMENT:  CLINICAL IMPRESSION: Pt able to complete created HEP indep.  She has water aerobic class she has returned to at the [facility] that she is able to complete indep and by memory.  She reports avoiding shoulder  movements due to increase in pain.  She has decided to cancel last aquatic session due to continued pain in shoulders with decision to have shoulder replacement made.  Not all goals have been met due to chronic nature of OA.  She has reached her max potential in setting and is ready for DC      From initial evaluation: Patient is a 77 y.o. f who was seen today for physical therapy evaluation and treatment for Joint pain left hip, shoulders, and knees. 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 She presents today with main complaint of shoulder pain.  She had PRP which did not help.  She is unable to wash under her arms without a brush.  She reports she has all over body pain daily all day and is just recovering from a 2 week stint with covid, feeling  very week. She does live alone. She will benefit from skilled physical therapy to improve ROM and strength of extremities, decrease pain and Improve her functional  mobility and ADL's.  Will initially see her in aquatics to return her to her PLOF prior to covid then transition to land based intervention. She was going to the [facility] 3 x week for water exercise when feeling well.   She has not yet decided on having shoulder surgery.  OBJECTIVE IMPAIRMENTS: Abnormal gait, decreased activity tolerance, decreased balance, decreased mobility, decreased ROM, decreased strength, impaired flexibility, and pain.   ACTIVITY LIMITATIONS: carrying, lifting, bending, sitting, standing, squatting, stairs, bathing, dressing, reach over head, and hygiene/grooming  PARTICIPATION LIMITATIONS: cleaning, laundry, shopping, and community activity  PERSONAL FACTORS: Age and Time since onset of injury/illness/exacerbation are also affecting patient's functional outcome.   REHAB POTENTIAL: Good  CLINICAL DECISION MAKING: Unstable/unpredictable  EVALUATION COMPLEXITY: High   GOALS: Goals reviewed with patient? Yes  SHORT TERM GOALS: Target date: 01/05/23  Pt will tolerate full aquatic sessions consistently without increase in pain and with improving function to demonstrate good toleration and effectiveness of intervention.  Baseline: Goal status: MET - 12/30/22  2.  Pt will amb to and from setting along with toleration full aquatic session to demonstrate progressing toleration to activity Baseline:  Goal status: Met 01/06/23  3.  Pt will report return to grocery shopping Baseline: pick up or delivery Goal status: deferred -no interested in returning to grocery shopping 01/04/23  4.  Pt will complete tandem and SLS submerged x 20s unsupported Baseline:land based 3-4s at eval;   Goal status: In progress 01/06/23; Not met 01/13/23  5.  Pt will improve on 5 X STS test to <or= 15s to demonstrate improving  functional lower extremity strength, transitional movements, and balance Baseline: 19.55; 13.59 Goal status: Met 01/06/23    LONG TERM GOALS: Target date: 01/28/23  Pt to meet stated Foto Goal Baseline:  Goal status: in progress/ Not met 01/13/23  2.  Pt will be able to wash under arms (personal goal) without brush Baseline: unable without brush Goal status: In progress 01/06/23; Not met 01/13/23  3.  Pt  will improve left shoulder Flex and abd towards 90d for improved ability with ADLs Baseline:  Goal status: In progress 01/06/23; Not met 01/13/23  4.  Pt will improve left hip strength to contralateral side to improve gait pattern Baseline:  Goal status: Met 01/06/23  5.  Pt will return to water aerobics at [facility] 3 x week Baseline: not going Goal status: In progress 01/06/23/ Met 01/13/23  6.  Pt will be indep with final HEP's (land and aquatic as appropriate) for continued management of condition Baseline:  Goal status: Met 01/13/23  PLAN:  PT FREQUENCY: 1-2x/week  PT DURATION: 8 weeks  PLANNED INTERVENTIONS: Therapeutic exercises, Therapeutic activity, Neuromuscular re-education, Balance training, Gait training, Patient/Family education, Self Care, Joint mobilization, Joint manipulation, Stair training, Orthotic/Fit training, DME instructions, Aquatic Therapy, Dry Needling, Electrical stimulation, Cryotherapy, Moist heat, scar mobilization, Splintting, Taping, Ionotophoresis 4mg /ml Dexamethasone, Manual therapy, and Re-evaluation.  PLAN FOR NEXT SESSION: Aquatics pain relief, gentle movement/ROM and stretching LE, core and shoulders.  Corrie Dandy [place]) Miko Markwood MPT 01/13/23 5:14 PM [facility] [address] [place], [place], [address] Phone: [phone]   Fax:  [phone]

## 2023-01-17 ENCOUNTER — Encounter (HOSPITAL_BASED_OUTPATIENT_CLINIC_OR_DEPARTMENT_OTHER): Payer: Self-pay | Admitting: Physical Therapy

## 2023-01-17 DIAGNOSIS — R7303 Prediabetes: Secondary | ICD-10-CM | POA: Diagnosis not present

## 2023-01-17 DIAGNOSIS — M8580 Other specified disorders of bone density and structure, unspecified site: Secondary | ICD-10-CM | POA: Diagnosis not present

## 2023-01-17 DIAGNOSIS — Z0000 Encounter for general adult medical examination without abnormal findings: Secondary | ICD-10-CM | POA: Diagnosis not present

## 2023-01-17 DIAGNOSIS — M1990 Unspecified osteoarthritis, unspecified site: Secondary | ICD-10-CM | POA: Diagnosis not present

## 2023-01-17 DIAGNOSIS — E663 Overweight: Secondary | ICD-10-CM | POA: Diagnosis not present

## 2023-01-17 DIAGNOSIS — G4700 Insomnia, unspecified: Secondary | ICD-10-CM | POA: Diagnosis not present

## 2023-01-17 DIAGNOSIS — F324 Major depressive disorder, single episode, in partial remission: Secondary | ICD-10-CM | POA: Diagnosis not present

## 2023-01-17 DIAGNOSIS — E785 Hyperlipidemia, unspecified: Secondary | ICD-10-CM | POA: Diagnosis not present

## 2023-01-17 DIAGNOSIS — M19112 Post-traumatic osteoarthritis, left shoulder: Secondary | ICD-10-CM | POA: Diagnosis not present

## 2023-01-18 ENCOUNTER — Encounter (HOSPITAL_BASED_OUTPATIENT_CLINIC_OR_DEPARTMENT_OTHER): Payer: Medicare Other | Admitting: Physical Therapy

## 2023-01-21 ENCOUNTER — Ambulatory Visit (HOSPITAL_BASED_OUTPATIENT_CLINIC_OR_DEPARTMENT_OTHER): Payer: Medicare Other | Admitting: Physical Therapy

## 2023-01-21 ENCOUNTER — Encounter: Payer: Self-pay | Admitting: Physical Medicine and Rehabilitation

## 2023-01-31 ENCOUNTER — Telehealth: Payer: Self-pay | Admitting: *Deleted

## 2023-01-31 NOTE — Telephone Encounter (Signed)
-----   Message from Horton Chin sent at 01/31/2023 11:58 AM EST ----- Hi April, I received missed call from this patient, if you can please let her know I am off this week and to call the office rather than my cell phone if she needs anything, she may be calling for clearance for her surgery and that needs to come from her PCP (I have indicated on her form), if it is regards to another issue, please schedule phone visit next week, thank you!

## 2023-01-31 NOTE — Telephone Encounter (Signed)
Morgan Reed called because she is needing orthopedic surgery and they need clearance regarding pain medication from Dr Carlis Abbott. I have faxed to [facility] attn Dr Ave Filter and surgical scheduler Darel Hong, that per our policy she can be prescribed narcotics during the treatment and post op period and then she will return to [place] and resume our treatment.

## 2023-02-09 ENCOUNTER — Encounter (HOSPITAL_COMMUNITY): Payer: Self-pay

## 2023-02-09 NOTE — Progress Notes (Signed)
Orders requested via in basket fro preop

## 2023-02-09 NOTE — Progress Notes (Addendum)
PCP - [facility] Horton Marshall PA Brooke hester PA Cardiologist - no Rheumatologist-Taylor Amada [place] Vicente Males MD)  PPM/ICD -  Device Orders -  Rep Notified -   Chest x-ray -  EKG -  Stress Test -  ECHO -  Cardiac Cath -   Sleep Study -  CPAP -   Fasting Blood Sugar -  Checks Blood Sugar _____ times a day  Blood Thinner Instructions: Aspirin Instructions: 81 mg  does not have to stop per surgeon  ERAS Protcol - PRE-SURGERY  G2-    COVID vaccine -yes  Activity--Able to complete ADL's with no CP or SOB  Anesthesia review: ongoing testing for Lupus undefined  wants anesthesia to be aware of this due to knee replacement delayed for one month in 2022 do to ANA test being positive and still undefined Surgeon is aware., pre DM   Patient denies shortness of breath, fever, cough and chest pain at PAT appointment   All instructions explained to the patient, with a verbal understanding of the material. Patient agrees to go over the instructions while at home for a better understanding. Patient also instructed to self quarantine after being tested for COVID-19. The opportunity to ask questions was provided.

## 2023-02-09 NOTE — Patient Instructions (Addendum)
SURGICAL WAITING ROOM VISITATION  Patients having surgery or a procedure may have no more than 2 support people in the waiting area - these visitors may rotate.    Children under the age of 55 must have an adult with them who is not the patient.   If the patient needs to stay at the hospital during part of their recovery, the visitor guidelines for inpatient rooms apply. Pre-op nurse will coordinate an appropriate time for 1 support person to accompany patient in pre-op.  This support person may not rotate.    Please refer to the [facility] website for the visitor guidelines for Inpatients (after your surgery is over and you are in a regular room).       Your procedure is scheduled on: 02-17-23   Report to [facility] Main Entrance    Report to admitting at     0710 AM   Call this number if you have problems the morning of surgery [phone]   Do not eat food :After Midnight.   After Midnight you may have the following liquids until __0725____ AM/  DAY OF SURGERY   then nothing by mouth  Water Non-Citrus Juices (without pulp, NO RED-Apple, White grape, White cranberry) Black Coffee (NO MILK/CREAM OR CREAMERS, sugar ok)  Clear Tea (NO MILK/CREAM OR CREAMERS, sugar ok) regular and decaf                             Plain Jell-O (NO RED)                                           Fruit ices (not with fruit pulp, NO RED)                                     Popsicles (NO RED)                                                               Sports drinks like Gatorade (NO RED)                   The day of surgery:  Drink ONE (1) Pre-Surgery Clear G2 at  0715  AM the morning of surgery. Drink in one sitting. Do not sip.  This drink was given to you during your hospital  pre-op appointment visit. Nothing else to drink after completing the  Pre-Surgery Clear G2   by 0725 am.          If you have questions, please contact your surgeon's office.   FOLLOW  ANY ADDITIONAL PRE  OP INSTRUCTIONS YOU RECEIVED FROM YOUR SURGEON'S OFFICE!!!     Oral Hygiene is also important to reduce your risk of infection.                                    Remember - BRUSH YOUR TEETH THE MORNING OF SURGERY WITH YOUR REGULAR TOOTHPASTE  DENTURES WILL BE REMOVED PRIOR TO  SURGERY PLEASE DO NOT APPLY "Poly grip" OR ADHESIVES!!!   Do NOT smoke after Midnight   Stop all vitamins and herbal supplements 7 days before surgery.   Take these medicines the morning of surgery with A SIP OF WATER: rosuvastatin, wellbutrin, tylenol or tramadol if needed  DO NOT TAKE ANY ORAL DIABETIC MEDICATIONS DAY OF YOUR SURGERY  Bring CPAP mask and tubing day of surgery.                              You may not have any metal on your body including hair pins, jewelry, and body piercing             Do not wear make-up, lotions, powders, perfumes/cologne, or deodorant  Do not wear nail polish including gel and S&S, artificial/acrylic nails, or any other type of covering on natural nails including finger and toenails. If you have artificial nails, gel coating, etc. that needs to be removed by a nail salon please have this removed prior to surgery or surgery may need to be canceled/ delayed if the surgeon/ anesthesia feels like they are unable to be safely monitored.   Do not shave  5 days prior to surgery.             Do not bring valuables to the hospital. [facility] IS NOT             RESPONSIBLE   FOR VALUABLES.   Contacts, glasses, dentures or bridgework may not be worn into surgery.   Bring small overnight bag day of surgery.   DO NOT BRING YOUR HOME MEDICATIONS TO THE HOSPITAL. PHARMACY WILL DISPENSE MEDICATIONS LISTED ON YOUR MEDICATION LIST TO YOU DURING YOUR ADMISSION IN THE HOSPITAL!    Patients discharged on the day of surgery will not be allowed to drive home.  Someone NEEDS to stay with you for the first 24 hours after anesthesia.   Special Instructions: Bring a copy of your healthcare  power of attorney and living will documents the day of surgery if you haven't scanned them before.              Please read over the following fact sheets you were given: IF YOU HAVE QUESTIONS ABOUT YOUR PRE-OP INSTRUCTIONS PLEASE CALL [phone]   If you test positive for Covid or have been in contact with anyone that has tested positive in the last 10 days please notify you surgeon.   [place]- Preparing for Total Shoulder Arthroplasty    Before surgery, you can play an important role. Because skin is not sterile, your skin needs to be as free of germs as possible. You can reduce the number of germs on your skin by using the following products.  Benzoyl Peroxide Gel Reduces the number of germs present on the skin Applied twice a day to shoulder area starting two days before surgery    ==================================================================  Please follow these instructions carefully:  BENZOYL PEROXIDE 5% GEL  Please do not use if you have an allergy to benzoyl peroxide.   If your skin becomes reddened/irritated stop using the benzoyl peroxide.  Starting two days before surgery, apply as follows: Apply benzoyl peroxide in the morning and at night. Apply after taking a shower. If you are not taking a shower clean entire shoulder front, back, and side along with the armpit with a clean wet washcloth.  Place a quarter-sized dollop on your shoulder and rub  in thoroughly, making sure to cover the front, back, and side of your shoulder, along with the armpit.   2 days before ____ AM   ____ PM              1 day before ____ AM   ____ PM                         Do this twice a day for two days.  (Last application is the night before surgery, AFTER using the CHG soap as described below).  Do NOT apply benzoyl peroxide gel on the day of surgery.      Pre-operative 5 CHG Bath Instructions   You can play a key role in reducing the risk of infection after surgery. Your skin  needs to be as free of germs as possible. You can reduce the number of germs on your skin by washing with CHG (chlorhexidine gluconate) soap before surgery. CHG is an antiseptic soap that kills germs and continues to kill germs even after washing.   DO NOT use if you have an allergy to chlorhexidine/CHG or antibacterial soaps. If your skin becomes reddened or irritated, stop using the CHG and notify one of our RNs at [phone].   Please shower with the CHG soap starting 4 days before surgery using the following schedule:     Please keep in mind the following:  DO NOT shave, including legs and underarms, starting the day of your first shower.   You may shave your face at any point before/day of surgery.  Place clean sheets on your bed the day you start using CHG soap. Use a clean washcloth (not used since being washed) for each shower. DO NOT sleep with pets once you start using the CHG.   CHG Shower Instructions:  If you choose to wash your hair and private area, wash first with your normal shampoo/soap.  After you use shampoo/soap, rinse your hair and body thoroughly to remove shampoo/soap residue.  Turn the water OFF and apply about 3 tablespoons (45 ml) of CHG soap to a CLEAN washcloth.  Apply CHG soap ONLY FROM YOUR NECK DOWN TO YOUR TOES (washing for 3-5 minutes)  DO NOT use CHG soap on face, private areas, open wounds, or sores.  Pay special attention to the area where your surgery is being performed.  If you are having back surgery, having someone wash your back for you may be helpful. Wait 2 minutes after CHG soap is applied, then you may rinse off the CHG soap.  Pat dry with a clean towel  Put on clean clothes/pajamas   If you choose to wear lotion, please use ONLY the CHG-compatible lotions on the back of this paper.     Additional instructions for the day of surgery: DO NOT APPLY any lotions, deodorants, cologne, or perfumes.   Put on clean/comfortable clothes.  Brush  your teeth.  Ask your nurse before applying any prescription medications to the skin.      CHG Compatible Lotions   Aveeno Moisturizing lotion  Cetaphil Moisturizing Cream  Cetaphil Moisturizing Lotion  Clairol Herbal Essence Moisturizing Lotion, Dry Skin  Clairol Herbal Essence Moisturizing Lotion, Extra Dry Skin  Clairol Herbal Essence Moisturizing Lotion, Normal Skin  Curel Age Defying Therapeutic Moisturizing Lotion with Alpha Hydroxy  Curel Extreme Care Body Lotion  Curel Soothing Hands Moisturizing Hand Lotion  Curel Therapeutic Moisturizing Cream, Fragrance-Free  Curel Therapeutic Moisturizing Lotion, Fragrance-Free  Curel  Therapeutic Moisturizing Lotion, Original Formula  Eucerin Daily Replenishing Lotion  Eucerin Dry Skin Therapy Plus Alpha Hydroxy Crme  Eucerin Dry Skin Therapy Plus Alpha Hydroxy Lotion  Eucerin Original Crme  Eucerin Original Lotion  Eucerin Plus Crme Eucerin Plus Lotion  Eucerin TriLipid Replenishing Lotion  Keri Anti-Bacterial Hand Lotion  Keri Deep Conditioning Original Lotion Dry Skin Formula Softly Scented  Keri Deep Conditioning Original Lotion, Fragrance Free Sensitive Skin Formula  Keri Lotion Fast Absorbing Fragrance Free Sensitive Skin Formula  Keri Lotion Fast Absorbing Softly Scented Dry Skin Formula  Keri Original Lotion  Keri Skin Renewal Lotion Keri Silky Smooth Lotion  Keri Silky Smooth Sensitive Skin Lotion  Nivea Body Creamy Conditioning Oil  Nivea Body Extra Enriched Lotion  Nivea Body Original Lotion  Nivea Body Sheer Moisturizing Lotion Nivea Crme  Nivea Skin Firming Lotion  NutraDerm 30 Skin Lotion  NutraDerm Skin Lotion  NutraDerm Therapeutic Skin Cream  NutraDerm Therapeutic Skin Lotion  ProShield Protective Hand Cream   Incentive Spirometer  An incentive spirometer is a tool that can help keep your lungs clear and active. This tool measures how well you are filling your lungs with each breath. Taking long deep  breaths may help reverse or decrease the chance of developing breathing (pulmonary) problems (especially infection) following: A long period of time when you are unable to move or be active. BEFORE THE PROCEDURE  If the spirometer includes an indicator to show your best effort, your nurse or respiratory therapist will set it to a desired goal. If possible, sit up straight or lean slightly forward. Try not to slouch. Hold the incentive spirometer in an upright position. INSTRUCTIONS FOR USE  Sit on the edge of your bed if possible, or sit up as far as you can in bed or on a chair. Hold the incentive spirometer in an upright position. Breathe out normally. Place the mouthpiece in your mouth and seal your lips tightly around it. Breathe in slowly and as deeply as possible, raising the piston or the ball toward the top of the column. Hold your breath for 3-5 seconds or for as long as possible. Allow the piston or ball to fall to the bottom of the column. Remove the mouthpiece from your mouth and breathe out normally. Rest for a few seconds and repeat Steps 1 through 7 at least 10 times every 1-2 hours when you are awake. Take your time and take a few normal breaths between deep breaths. The spirometer may include an indicator to show your best effort. Use the indicator as a goal to work toward during each repetition. After each set of 10 deep breaths, practice coughing to be sure your lungs are clear. If you have an incision (the cut made at the time of surgery), support your incision when coughing by placing a pillow or rolled up towels firmly against it. Once you are able to get out of bed, walk around indoors and cough well. You may stop using the incentive spirometer when instructed by your caregiver.  RISKS AND COMPLICATIONS Take your time so you do not get dizzy or light-headed. If you are in pain, you may need to take or ask for pain medication before doing incentive spirometry. It is harder  to take a deep breath if you are having pain. AFTER USE Rest and breathe slowly and easily. It can be helpful to keep track of a log of your progress. Your caregiver can provide you with a simple table to help with  this. If you are using the spirometer at home, follow these instructions: SEEK MEDICAL CARE IF:  You are having difficultly using the spirometer. You have trouble using the spirometer as often as instructed. Your pain medication is not giving enough relief while using the spirometer. You develop fever of 100.5 F (38.1 C) or higher. SEEK IMMEDIATE MEDICAL CARE IF:  You cough up bloody sputum that had not been present before. You develop fever of 102 F (38.9 C) or greater. You develop worsening pain at or near the incision site. MAKE SURE YOU:  Understand these instructions. Will watch your condition. Will get help right away if you are not doing well or get worse. Document Released: 07/05/2006 Document Revised: 05/17/2011 Document Reviewed: 09/05/2006 [facility] Patient Information 2014 [place], [place].   ________________________________________________________________________

## 2023-02-10 ENCOUNTER — Other Ambulatory Visit: Payer: Self-pay | Admitting: Orthopedic Surgery

## 2023-02-10 NOTE — Progress Notes (Signed)
Second request for pre op orders: Left voicemail for Morgan Reed.

## 2023-02-14 ENCOUNTER — Encounter (HOSPITAL_COMMUNITY)
Admission: RE | Admit: 2023-02-14 | Discharge: 2023-02-14 | Disposition: A | Discharge status: Home / Self Care | Payer: Medicare Other | Source: Ambulatory Visit | Attending: Orthopedic Surgery | Admitting: Orthopedic Surgery

## 2023-02-14 ENCOUNTER — Encounter (HOSPITAL_COMMUNITY): Payer: Self-pay

## 2023-02-14 ENCOUNTER — Other Ambulatory Visit: Payer: Self-pay

## 2023-02-14 ENCOUNTER — Ambulatory Visit (HOSPITAL_COMMUNITY)
Admission: RE | Admit: 2023-02-14 | Discharge: 2023-02-14 | Disposition: A | Discharge status: Home / Self Care | Payer: Medicare Other | Source: Ambulatory Visit | Attending: Orthopedic Surgery | Admitting: Orthopedic Surgery

## 2023-02-14 VITALS — BP 132/88 | HR 93 | Temp 98.2°F | Resp 16 | Ht <= 58 in | Wt 132.0 lb

## 2023-02-14 DIAGNOSIS — Z01818 Encounter for other preprocedural examination: Secondary | ICD-10-CM | POA: Insufficient documentation

## 2023-02-14 HISTORY — DX: Raised antibody titer: R76.0

## 2023-02-14 HISTORY — DX: Prediabetes: R73.03

## 2023-02-14 HISTORY — DX: Insomnia, unspecified: G47.00

## 2023-02-14 HISTORY — DX: Other specified disorders of bone density and structure, unspecified site: M85.80

## 2023-02-14 HISTORY — DX: Fibromyalgia: M79.7

## 2023-02-14 LAB — CBC
HCT: 39.7 % (ref 36.0–46.0)
Hemoglobin: 13 g/dL (ref 12.0–15.0)
MCH: 31 pg (ref 26.0–34.0)
MCHC: 32.7 g/dL (ref 30.0–36.0)
MCV: 94.5 fL (ref 80.0–100.0)
Platelets: 284 10*3/uL (ref 150–400)
RBC: 4.2 MIL/uL (ref 3.87–5.11)
RDW: 12.2 % (ref 11.5–15.5)
WBC: 6.6 10*3/uL (ref 4.0–10.5)
nRBC: 0 % (ref 0.0–0.2)

## 2023-02-14 LAB — SURGICAL PCR SCREEN
MRSA, PCR: NEGATIVE
Staphylococcus aureus: NEGATIVE

## 2023-02-17 ENCOUNTER — Observation Stay (HOSPITAL_COMMUNITY)
Admission: RE | Admit: 2023-02-17 | Discharge: 2023-02-18 | Disposition: A | Discharge status: Home / Self Care | Payer: Medicare Other | Source: Ambulatory Visit | Attending: Orthopedic Surgery | Admitting: Orthopedic Surgery

## 2023-02-17 ENCOUNTER — Encounter (HOSPITAL_COMMUNITY)
Admission: RE | Disposition: A | Discharge status: Home / Self Care | Payer: Self-pay | Source: Ambulatory Visit | Attending: Orthopedic Surgery

## 2023-02-17 ENCOUNTER — Other Ambulatory Visit (HOSPITAL_COMMUNITY): Payer: Self-pay

## 2023-02-17 ENCOUNTER — Other Ambulatory Visit: Payer: Self-pay

## 2023-02-17 ENCOUNTER — Ambulatory Visit (HOSPITAL_COMMUNITY): Payer: Medicare Other | Admitting: Registered Nurse

## 2023-02-17 ENCOUNTER — Encounter (HOSPITAL_COMMUNITY): Payer: Self-pay | Admitting: Orthopedic Surgery

## 2023-02-17 ENCOUNTER — Ambulatory Visit (HOSPITAL_COMMUNITY): Payer: Self-pay | Admitting: Physician Assistant

## 2023-02-17 DIAGNOSIS — Z79899 Other long term (current) drug therapy: Secondary | ICD-10-CM | POA: Insufficient documentation

## 2023-02-17 DIAGNOSIS — M19012 Primary osteoarthritis, left shoulder: Secondary | ICD-10-CM

## 2023-02-17 DIAGNOSIS — M19011 Primary osteoarthritis, right shoulder: Secondary | ICD-10-CM | POA: Diagnosis not present

## 2023-02-17 DIAGNOSIS — Z87891 Personal history of nicotine dependence: Secondary | ICD-10-CM | POA: Insufficient documentation

## 2023-02-17 DIAGNOSIS — Z96612 Presence of left artificial shoulder joint: Principal | ICD-10-CM

## 2023-02-17 DIAGNOSIS — Z7982 Long term (current) use of aspirin: Secondary | ICD-10-CM | POA: Insufficient documentation

## 2023-02-17 DIAGNOSIS — M75102 Unspecified rotator cuff tear or rupture of left shoulder, not specified as traumatic: Secondary | ICD-10-CM | POA: Insufficient documentation

## 2023-02-17 DIAGNOSIS — Z96651 Presence of right artificial knee joint: Secondary | ICD-10-CM | POA: Diagnosis not present

## 2023-02-17 DIAGNOSIS — G8918 Other acute postprocedural pain: Secondary | ICD-10-CM | POA: Diagnosis not present

## 2023-02-17 HISTORY — PX: REVERSE SHOULDER ARTHROPLASTY: SHX5054

## 2023-02-17 SURGERY — ARTHROPLASTY, SHOULDER, TOTAL, REVERSE
Anesthesia: General | Site: Shoulder | Laterality: Left

## 2023-02-17 MED ORDER — OXYCODONE HCL 5 MG PO TABS
10.0000 mg | ORAL_TABLET | ORAL | Status: DC | PRN
  Administered 2023-02-17 – 2023-02-18 (×2): 10 mg via ORAL
  Filled 2023-02-17: qty 2

## 2023-02-17 MED ORDER — PROPOFOL 10 MG/ML IV BOLUS
INTRAVENOUS | Status: DC | PRN
  Administered 2023-02-17: 100 mg via INTRAVENOUS

## 2023-02-17 MED ORDER — OXYCODONE HCL 5 MG PO TABS
5.0000 mg | ORAL_TABLET | ORAL | 0 refills | Status: DC | PRN
  Filled 2023-02-17: qty 30, 5d supply, fill #0

## 2023-02-17 MED ORDER — BUPIVACAINE LIPOSOME 1.3 % IJ SUSP
INTRAMUSCULAR | Status: DC | PRN
  Administered 2023-02-17: 10 mL via PERINEURAL

## 2023-02-17 MED ORDER — BISACODYL 5 MG PO TBEC: 5.0000 mg | DELAYED_RELEASE_TABLET | Freq: Every day | ORAL | Status: DC | PRN

## 2023-02-17 MED ORDER — ORAL CARE MOUTH RINSE: 15.0000 mL | Freq: Once | OROMUCOSAL | Status: AC

## 2023-02-17 MED ORDER — ROCURONIUM BROMIDE 10 MG/ML (PF) SYRINGE
PREFILLED_SYRINGE | INTRAVENOUS | Status: DC | PRN
  Administered 2023-02-17: 50 mg via INTRAVENOUS

## 2023-02-17 MED ORDER — CHLORHEXIDINE GLUCONATE 0.12 % MT SOLN
15.0000 mL | Freq: Once | OROMUCOSAL | Status: AC
  Administered 2023-02-17: 15 mL via OROMUCOSAL

## 2023-02-17 MED ORDER — FENTANYL CITRATE PF 50 MCG/ML IJ SOSY
50.0000 ug | PREFILLED_SYRINGE | INTRAMUSCULAR | Status: DC
  Administered 2023-02-17: 50 ug via INTRAVENOUS
  Filled 2023-02-17: qty 2

## 2023-02-17 MED ORDER — ASPIRIN 81 MG PO TBEC
81.0000 mg | DELAYED_RELEASE_TABLET | Freq: Two times a day (BID) | ORAL | Status: DC
  Administered 2023-02-18: 81 mg via ORAL
  Filled 2023-02-17: qty 1

## 2023-02-17 MED ORDER — ONDANSETRON HCL 4 MG PO TABS: 4.0000 mg | ORAL_TABLET | Freq: Four times a day (QID) | ORAL | Status: DC | PRN

## 2023-02-17 MED ORDER — FENTANYL CITRATE (PF) 100 MCG/2ML IJ SOLN
INTRAMUSCULAR | Status: AC
  Filled 2023-02-17: qty 2

## 2023-02-17 MED ORDER — METHOCARBAMOL 1000 MG/10ML IJ SOLN: 500.0000 mg | Freq: Four times a day (QID) | INTRAMUSCULAR | Status: DC | PRN

## 2023-02-17 MED ORDER — WATER FOR IRRIGATION, STERILE IR SOLN
Status: DC | PRN
  Administered 2023-02-17: 1000 mL

## 2023-02-17 MED ORDER — PHENOL 1.4 % MT LIQD: 1.0000 | OROMUCOSAL | Status: DC | PRN

## 2023-02-17 MED ORDER — OXYCODONE HCL 5 MG PO TABS
5.0000 mg | ORAL_TABLET | ORAL | Status: DC | PRN
  Administered 2023-02-17 – 2023-02-18 (×3): 10 mg via ORAL
  Filled 2023-02-17 (×4): qty 2

## 2023-02-17 MED ORDER — PHENYLEPHRINE HCL-NACL 20-0.9 MG/250ML-% IV SOLN
INTRAVENOUS | Status: DC | PRN
  Administered 2023-02-17: 25 ug/min via INTRAVENOUS

## 2023-02-17 MED ORDER — HYDROMORPHONE HCL 1 MG/ML IJ SOLN: 0.5000 mg | INTRAMUSCULAR | Status: DC | PRN

## 2023-02-17 MED ORDER — FENTANYL CITRATE (PF) 100 MCG/2ML IJ SOLN
INTRAMUSCULAR | Status: DC | PRN
  Administered 2023-02-17: 50 ug via INTRAVENOUS

## 2023-02-17 MED ORDER — SUGAMMADEX SODIUM 200 MG/2ML IV SOLN
INTRAVENOUS | Status: DC | PRN
  Administered 2023-02-17: 150 mg via INTRAVENOUS

## 2023-02-17 MED ORDER — POLYETHYLENE GLYCOL 3350 17 G PO PACK: 17.0000 g | PACK | Freq: Every day | ORAL | Status: DC | PRN

## 2023-02-17 MED ORDER — ONDANSETRON HCL 4 MG/2ML IJ SOLN
INTRAMUSCULAR | Status: DC | PRN
  Administered 2023-02-17: 4 mg via INTRAVENOUS

## 2023-02-17 MED ORDER — CEFAZOLIN SODIUM-DEXTROSE 2-4 GM/100ML-% IV SOLN
2.0000 g | INTRAVENOUS | Status: AC
  Administered 2023-02-17: 2 g via INTRAVENOUS
  Filled 2023-02-17: qty 100

## 2023-02-17 MED ORDER — DEXAMETHASONE SODIUM PHOSPHATE 10 MG/ML IJ SOLN
INTRAMUSCULAR | Status: AC
  Filled 2023-02-17: qty 1

## 2023-02-17 MED ORDER — TRAZODONE HCL 50 MG PO TABS
50.0000 mg | ORAL_TABLET | Freq: Every day | ORAL | Status: DC
  Administered 2023-02-17: 50 mg via ORAL
  Filled 2023-02-17: qty 1

## 2023-02-17 MED ORDER — ACETAMINOPHEN 325 MG PO TABS
325.0000 mg | ORAL_TABLET | Freq: Four times a day (QID) | ORAL | Status: DC | PRN
  Administered 2023-02-18: 650 mg via ORAL
  Filled 2023-02-17: qty 2

## 2023-02-17 MED ORDER — BUPIVACAINE-EPINEPHRINE (PF) 0.5% -1:200000 IJ SOLN
INTRAMUSCULAR | Status: DC | PRN
  Administered 2023-02-17: 15 mL via PERINEURAL

## 2023-02-17 MED ORDER — ROCURONIUM BROMIDE 10 MG/ML (PF) SYRINGE
PREFILLED_SYRINGE | INTRAVENOUS | Status: AC
  Filled 2023-02-17: qty 10

## 2023-02-17 MED ORDER — ACETAMINOPHEN 500 MG PO TABS
1000.0000 mg | ORAL_TABLET | Freq: Four times a day (QID) | ORAL | Status: AC
  Administered 2023-02-17 – 2023-02-18 (×4): 1000 mg via ORAL
  Filled 2023-02-17 (×4): qty 2

## 2023-02-17 MED ORDER — 0.9 % SODIUM CHLORIDE (POUR BTL) OPTIME
TOPICAL | Status: DC | PRN
  Administered 2023-02-17: 1000 mL

## 2023-02-17 MED ORDER — BUPROPION HCL ER (XL) 300 MG PO TB24
300.0000 mg | ORAL_TABLET | Freq: Every morning | ORAL | Status: DC
  Administered 2023-02-18: 300 mg via ORAL
  Filled 2023-02-17: qty 1

## 2023-02-17 MED ORDER — ALUM & MAG HYDROXIDE-SIMETH 200-200-20 MG/5ML PO SUSP: 30.0000 mL | ORAL | Status: DC | PRN

## 2023-02-17 MED ORDER — SODIUM CHLORIDE 0.9 % IV SOLN: INTRAVENOUS | Status: AC

## 2023-02-17 MED ORDER — DIPHENHYDRAMINE HCL 12.5 MG/5ML PO ELIX: 12.5000 mg | ORAL_SOLUTION | ORAL | Status: DC | PRN

## 2023-02-17 MED ORDER — METHOCARBAMOL 500 MG PO TABS
500.0000 mg | ORAL_TABLET | Freq: Four times a day (QID) | ORAL | Status: DC | PRN
  Administered 2023-02-18 (×2): 500 mg via ORAL
  Filled 2023-02-17 (×2): qty 1

## 2023-02-17 MED ORDER — LIDOCAINE 2% (20 MG/ML) 5 ML SYRINGE
INTRAMUSCULAR | Status: DC | PRN
  Administered 2023-02-17: 40 mg via INTRAVENOUS

## 2023-02-17 MED ORDER — TRANEXAMIC ACID-NACL 1000-0.7 MG/100ML-% IV SOLN
1000.0000 mg | INTRAVENOUS | Status: AC
  Administered 2023-02-17: 1000 mg via INTRAVENOUS
  Filled 2023-02-17: qty 100

## 2023-02-17 MED ORDER — LACTATED RINGERS IV SOLN
INTRAVENOUS | Status: AC
Start: 2023-02-17 — End: 2023-02-18

## 2023-02-17 MED ORDER — DEXAMETHASONE SODIUM PHOSPHATE 10 MG/ML IJ SOLN
INTRAMUSCULAR | Status: DC | PRN
  Administered 2023-02-17: 8 mg via INTRAVENOUS

## 2023-02-17 MED ORDER — DOCUSATE SODIUM 100 MG PO CAPS
100.0000 mg | ORAL_CAPSULE | Freq: Two times a day (BID) | ORAL | Status: DC
  Administered 2023-02-17 – 2023-02-18 (×2): 100 mg via ORAL
  Filled 2023-02-17 (×2): qty 1

## 2023-02-17 MED ORDER — TOPIRAMATE 25 MG PO TABS
50.0000 mg | ORAL_TABLET | Freq: Every day | ORAL | Status: DC
  Administered 2023-02-17: 50 mg via ORAL
  Filled 2023-02-17: qty 2

## 2023-02-17 MED ORDER — PROPOFOL 10 MG/ML IV BOLUS
INTRAVENOUS | Status: AC
Start: 2023-02-17 — End: ?
  Filled 2023-02-17: qty 20

## 2023-02-17 MED ORDER — FENTANYL CITRATE PF 50 MCG/ML IJ SOSY: 25.0000 ug | PREFILLED_SYRINGE | INTRAMUSCULAR | Status: DC | PRN

## 2023-02-17 MED ORDER — ONDANSETRON HCL 4 MG/2ML IJ SOLN: 4.0000 mg | Freq: Four times a day (QID) | INTRAMUSCULAR | Status: DC | PRN

## 2023-02-17 MED ORDER — MENTHOL 3 MG MT LOZG
1.0000 | LOZENGE | OROMUCOSAL | Status: DC | PRN
  Filled 2023-02-17: qty 9

## 2023-02-17 MED ORDER — FLEET ENEMA RE ENEM: 1.0000 | ENEMA | Freq: Once | RECTAL | Status: DC | PRN

## 2023-02-17 MED ORDER — SODIUM CHLORIDE 0.9 % IR SOLN
Status: DC | PRN
  Administered 2023-02-17: 1000 mL

## 2023-02-17 MED ORDER — ONDANSETRON HCL 4 MG/2ML IJ SOLN
INTRAMUSCULAR | Status: AC
  Filled 2023-02-17: qty 2

## 2023-02-17 MED ORDER — ROSUVASTATIN CALCIUM 20 MG PO TABS
20.0000 mg | ORAL_TABLET | Freq: Every day | ORAL | Status: DC
  Administered 2023-02-18: 20 mg via ORAL
  Filled 2023-02-17: qty 1

## 2023-02-17 SURGICAL SUPPLY — 68 items
BAG COUNTER SPONGE SURGICOUNT (BAG) IMPLANT
BAG ZIPLOCK 12X15 (MISCELLANEOUS) ×1 IMPLANT
BASEPLATE P2 COATD GLND 6.5X30 (Shoulder) IMPLANT
BIT DRILL 1.6MX128 (BIT) IMPLANT
BIT DRILL 2.5 DIA 127 CALI (BIT) IMPLANT
BIT DRILL 4 DIA CALIBRATED (BIT) IMPLANT
BLADE SAW SGTL 73X25 THK (BLADE) ×1 IMPLANT
BOOTIES KNEE HIGH SLOAN (MISCELLANEOUS) ×2 IMPLANT
COOLER ICEMAN CLASSIC (MISCELLANEOUS) ×1 IMPLANT
COVER BACK TABLE 60X90IN (DRAPES) ×1 IMPLANT
COVER SURGICAL LIGHT HANDLE (MISCELLANEOUS) ×1 IMPLANT
DRAPE INCISE IOBAN 66X45 STRL (DRAPES) ×1 IMPLANT
DRAPE POUCH INSTRU U-SHP 10X18 (DRAPES) ×1 IMPLANT
DRAPE SHEET LG 3/4 BI-LAMINATE (DRAPES) ×1 IMPLANT
DRAPE SURG 17X11 SM STRL (DRAPES) ×1 IMPLANT
DRAPE SURG ORHT 6 SPLT 77X108 (DRAPES) ×2 IMPLANT
DRAPE TOP 10253 STERILE (DRAPES) ×1 IMPLANT
DRAPE U-SHAPE 47X51 STRL (DRAPES) ×1 IMPLANT
DRSG AQUACEL AG ADV 3.5X 6 (GAUZE/BANDAGES/DRESSINGS) ×1 IMPLANT
DURAPREP 26ML APPLICATOR (WOUND CARE) ×2 IMPLANT
ELECT BLADE TIP CTD 4 INCH (ELECTRODE) ×1 IMPLANT
ELECT REM PT RETURN 15FT ADLT (MISCELLANEOUS) ×1 IMPLANT
FACESHIELD WRAPAROUND (MASK) ×1 IMPLANT
FACESHIELD WRAPAROUND OR TEAM (MASK) ×1 IMPLANT
GLENOSPHERE RSA 32 -6 W/SCRW (Shoulder) IMPLANT
GLOVE BIO SURGEON STRL SZ7.5 (GLOVE) ×1 IMPLANT
GLOVE BIOGEL PI IND STRL 6.5 (GLOVE) ×1 IMPLANT
GLOVE BIOGEL PI IND STRL 8 (GLOVE) ×1 IMPLANT
GLOVE SURG SS PI 6.5 STRL IVOR (GLOVE) ×1 IMPLANT
GOWN STRL REUS W/ TWL LRG LVL3 (GOWN DISPOSABLE) ×1 IMPLANT
GOWN STRL REUS W/ TWL XL LVL3 (GOWN DISPOSABLE) ×1 IMPLANT
HOOD PEEL AWAY T7 (MISCELLANEOUS) ×3 IMPLANT
INSERT SMALL SOCKET 32MM NEU (Insert) IMPLANT
KIT BASIN OR (CUSTOM PROCEDURE TRAY) ×1 IMPLANT
KIT TURNOVER KIT A (KITS) IMPLANT
MANIFOLD NEPTUNE II (INSTRUMENTS) ×1 IMPLANT
NDL TROCAR POINT SZ 2 1/2 (NEEDLE) IMPLANT
NEEDLE TROCAR POINT SZ 2 1/2 (NEEDLE) IMPLANT
NS IRRIG 1000ML POUR BTL (IV SOLUTION) ×1 IMPLANT
P2 COATDE GLNOID BSEPLT 6.5X30 (Shoulder) ×1 IMPLANT
PACK SHOULDER (CUSTOM PROCEDURE TRAY) ×1 IMPLANT
PAD COLD SHLDR WRAP-ON (PAD) ×1 IMPLANT
PROTECTOR NERVE ULNAR (MISCELLANEOUS) IMPLANT
RESTRAINT HEAD UNIVERSAL NS (MISCELLANEOUS) ×1 IMPLANT
RETRIEVER SUT HEWSON (MISCELLANEOUS) IMPLANT
SCREW BONE LOCKING RSP 5.0X14 (Screw) ×2 IMPLANT
SCREW BONE LOCKING RSP 5.0X30 (Screw) ×2 IMPLANT
SCREW BONE RSP LOCK 5X14 (Screw) IMPLANT
SCREW BONE RSP LOCK 5X30 (Screw) IMPLANT
SET HNDPC FAN SPRY TIP SCT (DISPOSABLE) ×1 IMPLANT
SLING ARM IMMOBILIZER LRG (SOFTGOODS) IMPLANT
SLING ARM IMMOBILIZER MED (SOFTGOODS) IMPLANT
SLING ARM IMMOBILIZER SM (MISCELLANEOUS) IMPLANT
STEM HUMERAL 8X48 SHOULDER (Miscellaneous) IMPLANT
STRIP CLOSURE SKIN 1/2X4 (GAUZE/BANDAGES/DRESSINGS) ×1 IMPLANT
SUCTION TUBE FRAZIER 10FR DISP (SUCTIONS) IMPLANT
SUPPORT WRAP ARM LG (MISCELLANEOUS) ×1 IMPLANT
SUT ETHIBOND 2 V 37 (SUTURE) IMPLANT
SUT FIBERWIRE #2 38 REV NDL BL (SUTURE)
SUT MNCRL AB 4-0 PS2 18 (SUTURE) ×1 IMPLANT
SUT VIC AB 2-0 CT1 TAPERPNT 27 (SUTURE) ×2 IMPLANT
SUTURE FIBERWR#2 38 REV NDL BL (SUTURE) IMPLANT
TAP SURG THRD DJ 6.5 (ORTHOPEDIC DISPOSABLE SUPPLIES) IMPLANT
TAPE LABRALWHITE 1.5X36 (TAPE) IMPLANT
TAPE SUT LABRALTAP WHT/BLK (SUTURE) IMPLANT
TOWEL OR 17X26 10 PK STRL BLUE (TOWEL DISPOSABLE) ×1 IMPLANT
TOWEL OR NON WOVEN STRL DISP B (DISPOSABLE) ×1 IMPLANT
WATER STERILE IRR 1000ML POUR (IV SOLUTION) ×1 IMPLANT

## 2023-02-17 NOTE — Anesthesia Procedure Notes (Signed)
Anesthesia Regional Block: Interscalene brachial plexus block   Pre-Anesthetic Checklist: , timeout performed,  Correct Patient, Correct Site, Correct Laterality,  Correct Procedure, Correct Position, site marked,  Risks and benefits discussed,  Pre-op evaluation,  At surgeon's request and post-op pain management  Laterality: Left  Prep: Maximum Sterile Barrier Precautions used, chloraprep       Needles:  Injection technique: Single-shot  Needle Type: Echogenic Stimulator Needle     Needle Length: 5cm  Needle Gauge: 22     Additional Needles:   Procedures:,,,, ultrasound used (permanent image in chart),,    Narrative:  Start time: 02/17/2023 8:38 AM End time: 02/17/2023 8:48 AM Injection made incrementally with aspirations every 5 mL.  Performed by: Personally  Anesthesiologist: Gaynelle Adu, MD

## 2023-02-17 NOTE — Op Note (Signed)
Procedure(s): REVERSE SHOULDER ARTHROPLASTY Procedure Note  Morgan Reed female 77 y.o. 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 female  With endstage left shoulder arthritis with rotator cuff disease. Pain and dysfunction interfered with quality of life and nonoperative treatment with activity modification, NSAIDS and injections failed.     Surgeon: Glennon Hamilton   Assistants: Fredia Sorrow PA-C Amber was present and scrubbed throughout the procedure and was essential in positioning, retraction, exposure, and closure)  Anesthesia: General endotracheal anesthesia with preoperative interscalene block given by the attending anesthesiologist     Procedure Detail  REVERSE SHOULDER ARTHROPLASTY   Estimated Blood Loss:  200 mL         Drains: none  Blood Given: none          Specimens: none        Complications:  * No complications entered in OR log *         Disposition: PACU - hemodynamically stable.         Condition: stable      OPERATIVE FINDINGS:  A DJO Altivate pressfit reverse total shoulder arthroplasty was placed with a  size 8 stem, a 32-6 glenosphere, and a standard-mm poly insert. The base plate  fixation was excellent.  PROCEDURE: The patient was identified in the preoperative holding area  where I personally marked the operative site after verifying site, side,  and procedure with the patient. An interscalene block given by  the attending anesthesiologist in the holding area and the patient was taken back to the operating room where all extremities were  carefully padded in position after general anesthesia was induced. She  was placed in a beach-chair position and the operative upper extremity was  prepped and draped in a standard sterile  fashion. An approximately 10-  cm incision was made from the tip of the coracoid process to the center  point of the humerus at the level of the axilla. Dissection was carried  down through subcutaneous tissues to the level of the cephalic vein  which was taken laterally with the deltoid. The pectoralis major was  retracted medially. The subdeltoid space was developed and the lateral  edge of the conjoined tendon was identified. The undersurface of  conjoined tendon was palpated and the musculocutaneous nerve was not in  the field. Retractor was placed underneath the conjoined and second  retractor was placed lateral into the deltoid. The circumflex humeral  artery and vessels were identified and clamped and coagulated. The  biceps tendon was tenotomized.  The subscapularis was taken down as a peel with the underlying capsule.  The  joint was then gently externally rotated while the capsule was released  from the humeral neck around to just beyond the 6 o'clock position. At  this point, the joint was dislocated and the humeral head was presented  into the wound. The excessive osteophyte formation was removed with a  large rongeur.  The cutting guide was used to make the appropriate  head cut and the head was saved for potentially bone grafting.  The glenoid was exposed with the arm in an  abducted extended position. The anterior and posterior labrum were  completely excised and the capsule was released circumferentially to  allow for exposure of the glenoid for preparation. The 2.5 mm drill was  placed using the guide in 5-10 inferior angulation and the tap was then advanced in the same hole. Small and large reamers were then used. The tap was then removed and the Metaglene was then screwed in with excellent purchase.  The peripheral guide was then used to drilled measured and filled peripheral locking screws. The size 32-6 glenosphere was then impacted on the [facility] taper and the central screw  was placed. The humerus was then again exposed and the diaphyseal reamers were used followed by the metaphyseal reamers. The final broach was left in place in the proximal trial was placed. The joint was reduced and with this implant it was felt that soft tissue tensioning was appropriate with excellent stability and excellent range of motion. Therefore, final humeral stem was placed press-fit with bone graft.  And then the trial polyethylene inserts were tested again and the above implant was felt to be the most appropriate for final insertion. The joint was reduced taken through full range of motion and felt to be stable. Soft tissue tension was appropriate.  The joint was then copiously irrigated with pulse  lavage and the wound was then closed. The subscapularis was repaired with one #2 FiberWire through the lesser tuberosity.  Skin was closed with 2-0 Vicryl in a deep dermal layer and 4-0  Monocryl for skin closure. Steri-Strips were applied. Sterile  dressings were then applied as well as a sling. The patient was allowed  to awaken from general anesthesia, transferred to stretcher, and taken  to recovery room in stable condition.   POSTOPERATIVE PLAN: The patient will be kept in the hospital postoperatively  for pain control and observation.

## 2023-02-17 NOTE — Anesthesia Preprocedure Evaluation (Addendum)
Anesthesia Evaluation  Patient identified by MRN, date of birth, ID band Patient awake    Reviewed: Allergy & Precautions, H&P , NPO status , Patient's Chart, lab work & pertinent test results  Airway Mallampati: II  TM Distance: >3 FB Neck ROM: Full    Dental no notable dental hx. (+) Teeth Intact, Dental Advisory Given   Pulmonary former smoker   Pulmonary exam normal breath sounds clear to auscultation       Cardiovascular negative cardio ROS  Rhythm:Regular Rate:Normal     Neuro/Psych   Anxiety     negative neurological ROS     GI/Hepatic negative GI ROS, Neg liver ROS,,,  Endo/Other  negative endocrine ROS    Renal/GU negative Renal ROS  negative genitourinary   Musculoskeletal  (+) Arthritis , Osteoarthritis,  Fibromyalgia -  Abdominal   Peds  Hematology negative hematology ROS (+)   Anesthesia Other Findings   Reproductive/Obstetrics negative OB ROS                             Anesthesia Physical Anesthesia Plan  ASA: 2  Anesthesia Plan: General   Post-op Pain Management: Regional block*   Induction: Intravenous  PONV Risk Score and Plan: 4 or greater and Ondansetron, Dexamethasone and Treatment may vary due to age or medical condition  Airway Management Planned: Oral ETT  Additional Equipment:   Intra-op Plan:   Post-operative Plan: Extubation in OR  Informed Consent: I have reviewed the patients History and Physical, chart, labs and discussed the procedure including the risks, benefits and alternatives for the proposed anesthesia with the patient or authorized representative who has indicated his/her understanding and acceptance.     Dental advisory given  Plan Discussed with: CRNA  Anesthesia Plan Comments:        Anesthesia Quick Evaluation

## 2023-02-17 NOTE — H&P (Signed)
Morgan Reed is an 77 y.o. female.   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  Pain interferes with sleep and quality of life.   Past Medical History:  Diagnosis Date   Anxiety    Arthritis    knees, hands  osteoarthritis   Fibromyalgia    Insomnia    Lupus anticoagulant positive    undefined after extensive testing since 2022  abnormal coagulation profile   Osteopenia    Pre-diabetes     Past Surgical History:  Procedure Laterality Date   ABDOMINAL HYSTERECTOMY  1995   complication with bowel tear   BREAST BIOPSY     right breast benign approx 1990   BREAST EXCISIONAL BIOPSY Right    right breast benign 1990   BUNIONECTOMY  2000   EYE SURGERY Bilateral 2014   cataracts   REPLACEMENT TOTAL KNEE Right 2013   TONSILLECTOMY     as a child   TOTAL KNEE REVISION Right 12/01/2020   Procedure: RIGHT TOTAL KNEE REVISION;  Surgeon: Gean Birchwood, MD;  Location: [facility];  Service: Orthopedics;  Laterality: Right;    Family History  Problem Relation Age of Onset   Cancer Mother        leukemia   Cancer Brother        liver cancer   Healthy Son    Healthy Daughter    Social History:  reports that she quit smoking about 46 years ago. Her smoking use included cigarettes. She has never been exposed to tobacco smoke. She has never used smokeless tobacco. She reports that she does not currently use alcohol. She reports that she does not currently use drugs.  Allergies:  Allergies  Allergen Reactions   Atorvastatin Itching    Medications Prior to Admission  Medication Sig Dispense Refill   acetaminophen (TYLENOL) 650 MG CR tablet Take 1,300 mg by mouth 2 (two) times daily.     aspirin EC 81 MG tablet Take 1 tablet (81 mg total) by mouth 2 (two) times daily. 60 tablet 0   buPROPion (WELLBUTRIN XL) 300 MG 24 hr tablet Take 300 mg by mouth every  morning.     CALCIUM-VITAMIN D PO Take 1 tablet by mouth daily.     clotrimazole (LOTRIMIN) 1 % cream Apply 1 Application topically daily as needed (irritation).     Glucosamine-Chondroitin-MSM (GLUCOSAMINE CHONDROIT MSM DS PO) Take 1 tablet by mouth in the morning and at bedtime.     HYDROCORTISONE EX Apply 1 Application topically daily as needed (itching).     Multiple Vitamin (MULTIVITAMIN WITH MINERALS) TABS tablet Take 1 tablet by mouth daily.     Polyethyl Glycol-Propyl Glycol (SYSTANE OP) Place 1 drop into both eyes 2 (two) times daily as needed (dry eyes).     Probiotic Product (PROBIOTIC PO) Take 1 capsule by mouth daily.     rosuvastatin (CRESTOR) 20 MG tablet Take 20 mg by mouth daily.     TART CHERRY PO Take 1 capsule by mouth daily.     tiZANidine (ZANAFLEX) 2 MG tablet Take 1 tablet (2 mg total) by mouth every 6 (six) hours as needed. (Patient taking differently: Take 2 mg by mouth every 6 (six) hours as needed for muscle spasms.) 60 tablet 0   topiramate (TOPAMAX) 50 MG tablet Take 1 tablet (50 mg total) by mouth at bedtime. 90 tablet 3   traMADol (ULTRAM) 50  MG tablet Take 50 mg by mouth 2 (two) times daily as needed for pain.     traZODone (DESYREL) 50 MG tablet Take 50 mg by mouth at bedtime.     Vitamin D, Ergocalciferol, (DRISDOL) 1.25 MG (50000 UNIT) CAPS capsule TAKE 1 CAPSULE BY MOUTH EVERY 7 DAYS 7 capsule 0   hydrOXYzine (ATARAX) 10 MG tablet Take 10 mg by mouth 3 (three) times daily as needed for itching.     Vitamin D, Ergocalciferol, (DRISDOL) 1.25 MG (50000 UNIT) CAPS capsule Take 1 capsule (50,000 Units total) by mouth every 7 (seven) days. (Patient not taking: Reported on 02/11/2023) 7 capsule 0    No results found for this or any previous visit (from the past 48 hours). No results found.  Review of Systems  All other systems reviewed and are negative.   Blood pressure 119/81, pulse 69, temperature 97.8 F (36.6 C), temperature source Oral, resp. rate 18,  height 4' 9.5" (1.461 m), weight 59.9 kg, SpO2 96%. Physical Exam HENT:     Head: Atraumatic.  Eyes:     Extraocular Movements: Extraocular movements intact.  Cardiovascular:     Pulses: Normal pulses.  Pulmonary:     Effort: Pulmonary effort is normal.  Musculoskeletal:     Comments: Left shoulder pain with limited range of motion.  Neurological:     Mental Status: She is alert.      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  Pain interferes with sleep and quality of life. Plan L reverse TSA Risks / benefits of surgery discussed Consent on chart  NPO for OR Preop antibiotics   Glennon Hamilton, MD 02/17/2023, 8:30 AM

## 2023-02-17 NOTE — Discharge Instructions (Signed)
Discharge Instructions after Reverse Total Shoulder Arthroplasty   A sling has been provided for you. You are to wear this at all times (except for bathing and dressing), until your first post operative visit with Dr. Ave Filter. Please also wear while sleeping at night. While you bath and dress, let the arm/elbow extend straight down to stretch your elbow. Wiggle your fingers and pump your first while your in the sling to prevent hand swelling. Use ice on the shoulder intermittently over the first 48 hours after surgery. Continue to use ice or and ice machine as needed after 48 hours for pain control/swelling.  Pain medicine has been prescribed for you.  Use your medicine liberally over the first 48 hours, and then you can begin to taper your use. You may take Extra Strength Tylenol or Tylenol only in place of the pain pills. DO NOT take ANY nonsteroidal anti-inflammatory pain medications: Advil, Motrin, Ibuprofen, Aleve, Naproxen or Naprosyn.  Take one aspirin a day for 2 weeks after surgery, unless you have an aspirin sensitivity/allergy or asthma.  Leave your dressing on until your first follow up visit.  You may shower with the dressing.  Hold your arm as if you still have your sling on while you shower. Simply allow the water to wash over the site and then pat dry. Make sure your axilla (armpit) is completely dry after showering. Do not place fingers in loops in sling!    Please call [phone] during normal business hours or [phone] after hours for any problems. Including the following:  - excessive redness of the incisions - drainage for more than 4 days - fever of more than 101.5 F  *Please note that pain medications will not be refilled after hours or on weekends.  Dental Antibiotics:  In most cases prophylactic antibiotics for Dental procdeures after total joint surgery are not necessary.  Exceptions are as follows:  1. History of prior total joint infection  2. Severely  immunocompromised (Organ Transplant, cancer chemotherapy, Rheumatoid biologic meds such as Humera)  3. Poorly controlled diabetes (A1C &gt; 8.0, blood glucose over 200)  If you have one of these conditions, contact your surgeon for an antibiotic prescription, prior to your dental procedure.

## 2023-02-17 NOTE — Anesthesia Procedure Notes (Signed)
Procedure Name: Intubation Date/Time: 02/17/2023 9:14 AM  Performed by: Elisabeth Cara, CRNAPre-anesthesia Checklist: Patient identified, Emergency Drugs available, Suction available, Patient being monitored and Timeout performed Patient Re-evaluated:Patient Re-evaluated prior to induction Oxygen Delivery Method: Circle system utilized Preoxygenation: Pre-oxygenation with 100% oxygen Induction Type: IV induction Ventilation: Mask ventilation without difficulty Laryngoscope Size: Mac and 4 Grade View: Grade I Tube type: Oral Tube size: 7.0 mm Number of attempts: 1 Airway Equipment and Method: Stylet Placement Confirmation: ETT inserted through vocal cords under direct vision, positive ETCO2 and breath sounds checked- equal and bilateral Secured at: 22 cm Tube secured with: Tape Dental Injury: Teeth and Oropharynx as per pre-operative assessment

## 2023-02-17 NOTE — Anesthesia Postprocedure Evaluation (Signed)
Anesthesia Post Note  Patient: ALIANNAH DEVALL  Procedure(s) Performed: REVERSE SHOULDER ARTHROPLASTY (Left: Shoulder)     Patient location during evaluation: PACU Anesthesia Type: General and Regional Level of consciousness: awake and alert Pain management: pain level controlled Vital Signs Assessment: post-procedure vital signs reviewed and stable Respiratory status: spontaneous breathing, nonlabored ventilation and respiratory function stable Cardiovascular status: blood pressure returned to baseline and stable Postop Assessment: no apparent nausea or vomiting Anesthetic complications: no  No notable events documented.  Last Vitals:  Vitals:   02/17/23 1145 02/17/23 1200  BP: 125/83 127/79  Pulse: 70 70  Resp: 15 16  Temp: 36.6 C   SpO2: 96% 96%    Last Pain:  Vitals:   02/17/23 1200  TempSrc:   PainSc: 0-No pain                 Jozelyn Kuwahara,W. EDMOND

## 2023-02-17 NOTE — Transfer of Care (Signed)
Immediate Anesthesia Transfer of Care Note  Patient: Morgan Reed  Procedure(s) Performed: REVERSE SHOULDER ARTHROPLASTY (Left: Shoulder)  Patient Location: PACU  Anesthesia Type:General  Level of Consciousness: awake, alert , oriented, and patient cooperative  Airway & Oxygen Therapy: Patient Spontanous Breathing and Patient connected to face mask oxygen  Post-op Assessment: Report given to RN and Post -op Vital signs reviewed and stable  Post vital signs: Reviewed and stable  Last Vitals:  Vitals Value Taken Time  BP 147/71 02/17/23 1030  Temp    Pulse 72 02/17/23 1031  Resp 17 02/17/23 1031  SpO2 100 % 02/17/23 1031  Vitals shown include unfiled device data.  Last Pain:  Vitals:   02/17/23 0900  TempSrc:   PainSc: 0-No pain      Patients Stated Pain Goal: 6 (02/17/23 0454)  Complications: No notable events documented.

## 2023-02-18 ENCOUNTER — Other Ambulatory Visit (HOSPITAL_COMMUNITY): Payer: Self-pay

## 2023-02-18 DIAGNOSIS — Z7982 Long term (current) use of aspirin: Secondary | ICD-10-CM | POA: Diagnosis not present

## 2023-02-18 DIAGNOSIS — M75102 Unspecified rotator cuff tear or rupture of left shoulder, not specified as traumatic: Secondary | ICD-10-CM | POA: Diagnosis not present

## 2023-02-18 DIAGNOSIS — Z96651 Presence of right artificial knee joint: Secondary | ICD-10-CM | POA: Diagnosis not present

## 2023-02-18 DIAGNOSIS — Z79899 Other long term (current) drug therapy: Secondary | ICD-10-CM | POA: Diagnosis not present

## 2023-02-18 DIAGNOSIS — M19012 Primary osteoarthritis, left shoulder: Secondary | ICD-10-CM | POA: Diagnosis not present

## 2023-02-18 DIAGNOSIS — Z87891 Personal history of nicotine dependence: Secondary | ICD-10-CM | POA: Diagnosis not present

## 2023-02-18 NOTE — Plan of Care (Signed)
  Problem: Coping: Goal: Level of anxiety will decrease Outcome: Progressing   Problem: Elimination: Goal: Will not experience complications related to urinary retention Outcome: Progressing   Problem: Safety: Goal: Ability to remain free from injury will improve Outcome: Progressing   Problem: Pain Management: Goal: Pain level will decrease with appropriate interventions Outcome: Progressing

## 2023-02-18 NOTE — Evaluation (Signed)
Occupational Therapy Evaluation Patient Details Name: Morgan Reed MRN: [id number] DOB: 1945/08/01 Today's Date: 02/18/2023   History of Present Illness Morgan Reed is a 77 yr old female who is s/p a L reverse shoulder arthroplasty on 02-17-23, due to end stage OA and significant rotator cuff disease.   Clinical Impression   Pt is s/p shoulder replacement of left non-dominant upper extremity on 02-17-23. Therapist provided education and instruction to patient with regards to ROM/exercises, post-op precautions, UE and sling positioning, donning upper extremity clothing, recommendations for bathing while maintaining shoulder precautions, use of ice for pain and edema management, correct use of ice machine, NWB status, sling wear schedule, and correctly donning/doffing sling. Patient presented with fair+ recall and teach back abilities. Patient needed assistance to donn shirt,  pants, socks and shoes, with instruction provided on compensatory strategies to perform ADLs. OT will continue to follow the pt for further instruction and education in the acute care setting. Patient to follow up with MD for further post-acute care therapy needs.         If plan is discharge home, recommend the following: Assistance with cooking/housework;Assist for transportation;A little help with bathing/dressing/bathroom    Functional Status Assessment  Patient has had a recent decline in their functional status and demonstrates the ability to make significant improvements in function in a reasonable and predictable amount of time.  Equipment Recommendations  Tub/shower seat    Recommendations for Other Services       Precautions / Restrictions Precautions Precautions: Shoulder Shoulder Interventions: Shoulder sling/immobilizer Precaution Booklet Issued: Yes (comment) Required Braces or Orthoses: Sling Restrictions Weight Bearing Restrictions Per Provider Order: Yes LUE Weight Bearing Per Provider Order: Non  weight bearing Other Position/Activity Restrictions: Okay to perform L UE elbow, wrist, and hand ROM. Sling to worn at all times except ADLs/exercise, no shoulder ROM      Mobility Bed Mobility    General bed mobility comments: Pt was received seated EOB    Transfers Overall transfer level: Independent Equipment used: None      Balance     Sitting balance-Leahy Scale: Good       Standing balance-Leahy Scale: Good         ADL either performed or assessed with clinical judgement       Pertinent Vitals/Pain Pain Assessment Pain Assessment: 0-10 Pain Score: 8  Pain Location: L UE Pain Intervention(s): Limited activity within patient's tolerance, Monitored during session, Patient requesting pain meds-RN notified     Extremity/Trunk Assessment Upper Extremity Assessment Upper Extremity Assessment: Right hand dominant           Communication Communication Communication: No apparent difficulties   Cognition Arousal: Alert Behavior During Therapy: WFL for tasks assessed/performed Overall Cognitive Status: Within Functional Limits for tasks assessed        General Comments: Oriented x4, able to follow commands           Shoulder Instructions Shoulder Instructions Donning/doffing shirt without moving shoulder: Moderate assistance Method for sponge bathing under operated UE: Supervision/safety (Pt required min cues to recall) Donning/doffing sling/immobilizer: Moderate assistance Correct positioning of sling/immobilizer: Moderate assistance Pendulum exercises (written home exercise program):  (N/A) ROM for elbow, wrist and digits of operated UE:  (Pt verbalized understanding) Sling wearing schedule (on at all times/off for ADL's):  (Pt correctly verbalized) Proper positioning of operated UE when showering:  (Pt verbalized understanding) Dressing change:  (Pt verbalized understanding) Positioning of UE while sleeping:  (Pt verbalized understanding)    Home  Living Family/patient expects to be discharged to:: Private residence Living Arrangements: Alone. She stated she will have hired assistance as long as needed for ~4 hours starting on Saturday. She also has several friends who can assist her as needed.  Available Help at Discharge: Friend(s);Neighbor Type of Home: Apartment       Home Layout: One level     Bathroom Shower/Tub: Walk-in shower         Home Equipment: Grab bars - tub/shower;Cane - single point          Prior Functioning/Environment Prior Level of Function : Independent/Modified Independent;Driving             Mobility Comments: She was independent with ambulation. ADLs Comments: She was modified independent to independent with ADLs and household IADLs. She drives.        OT Problem List: Impaired UE functional use;Pain;Decreased range of motion;Decreased knowledge of precautions      OT Treatment/Interventions: Self-care/ADL training;Therapeutic exercise;Energy conservation;DME and/or AE instruction;Patient/family education;Therapeutic activities;Balance training    OT Goals(Current goals can be found in the care plan section) Acute Rehab OT Goals OT Goal Formulation: With patient Time For Goal Achievement: 03/04/23 Potential to Achieve Goals: Good  OT Frequency: Min 1X/week       AM-PAC OT "6 Clicks" Daily Activity     Outcome Measure Help from another person eating meals?: None Help from another person taking care of personal grooming?: A Little Help from another person toileting, which includes using toliet, bedpan, or urinal?: A Little Help from another person bathing (including washing, rinsing, drying)?: A Lot Help from another person to put on and taking off regular upper body clothing?: A Lot Help from another person to put on and taking off regular lower body clothing?: A Little 6 Click Score: 17   End of Session Equipment Utilized During Treatment: Other (comment) (N/A) Nurse  Communication: Other (comment) (shoulder education completed)  Activity Tolerance: Patient tolerated treatment well Patient left: with call Ruelas/phone within reach  OT Visit Diagnosis: Muscle weakness (generalized) (M62.81)                Time: 1018-1100 OT Time Calculation (min): 42 min Charges:  OT General Charges $OT Visit: 1 Visit OT Evaluation $OT Eval Moderate Complexity: 1 Mod OT Treatments $Self Care/Home Management : 8-22 mins   Reuben Likes, OTR/L 02/18/2023, 12:21 PM

## 2023-02-18 NOTE — Progress Notes (Signed)
PATIENT ID: Morgan Reed  MRN: [id number]  DOB/AGE:  May 17, 1945 / 77 y.o.  1 Day Post-Op Procedure(s) (LRB): REVERSE SHOULDER ARTHROPLASTY (Left)  Subjective: Patient reports that she has had more pain this morning as her nerve block had worn off. No SOB or chest pain. Voiding well. Positive flatus.   Objective: Vital signs in last 24 hours: Temp:  [97.2 F (36.2 C)-98.1 F (36.7 C)] 97.9 F (36.6 C) (12/13 0554) Pulse Rate:  [67-80] 77 (12/13 0554) Resp:  [14-19] 18 (12/13 0554) BP: (100-147)/(63-83) 100/63 (12/13 0554) SpO2:  [96 %-100 %] 99 % (12/13 0554) Weight:  [59.9 kg] 59.9 kg (12/12 0738)  Intake/Output from previous day: 12/12 0701 - 12/13 0700 In: 1846.1 [P.O.:830; I.V.:816.1; IV Piggyback:200] Out: 100 [Blood:100]  Physical Exam: Neurologically intact Sensation intact distally Intact pulses distally Incision: dressing C/D/I No cellulitis present Able to move hand and wrist well LUE  Assessment/Plan: 1 Day Post-Op Procedure(s) (LRB): REVERSE SHOULDER ARTHROPLASTY (Left)   Advance diet Up with therapy Discharge home after OT Non Weight Bearing (NWB) LUE  VTE prophylaxis:  resume aspirin 81mg  daily  Discharge home today after OT. Discussed discharge instructions. Rx sent to pharmacy for pain. Follow up in office in 2 weeks.    Morgan Milliner L. Porterfield, PA-C 02/18/2023, 7:33 AM

## 2023-02-24 NOTE — Discharge Summary (Signed)
Patient ID: Morgan Reed MRN: [id number] DOB/AGE: [birth year] 77 y.o.  Admit date: 02/17/2023 Discharge date: 02/18/2023  Admission Diagnoses:  Principal Problem:   S/P reverse total shoulder arthroplasty, left   Discharge Diagnoses:  Same  Past Medical History:  Diagnosis Date   Anxiety    Arthritis    knees, hands  osteoarthritis   Fibromyalgia    Insomnia    Lupus anticoagulant positive    undefined after extensive testing since 2022  abnormal coagulation profile   Osteopenia    Pre-diabetes     Surgeries: Procedure(s): REVERSE SHOULDER ARTHROPLASTY on 02/17/2023   Consultants:   Discharged Condition: Improved  Hospital Course: Morgan Reed is an 77 y.o. female who was admitted 02/17/2023 for operative treatment ofS/P reverse total shoulder arthroplasty, left. Patient has severe unremitting pain that affects sleep, daily activities, and work/hobbies. After pre-op clearance the patient was taken to the operating room on 02/17/2023 and underwent  Procedure(s): REVERSE SHOULDER ARTHROPLASTY.    Patient was given perioperative antibiotics:  Anti-infectives (From admission, onward)    Start     Dose/Rate Route Frequency Ordered Stop   02/17/23 0745  ceFAZolin (ANCEF) IVPB 2g/100 mL premix        2 g 200 mL/hr over 30 Minutes Intravenous On call to O.R. 02/17/23 0730 02/17/23 0945        Patient was given sequential compression devices, early ambulation, and chemoprophylaxis to prevent DVT.  Patient benefited maximally from hospital stay and there were no complications.      Discharge Medications:   Allergies as of 02/18/2023       Reactions   Atorvastatin Itching        Medication List     STOP taking these medications    traMADol 50 MG tablet Commonly known as: ULTRAM       TAKE these medications    acetaminophen 650 MG CR tablet Commonly known as: TYLENOL Take 1,300 mg by mouth 2 (two) times daily.   aspirin EC 81 MG tablet Take 1  tablet (81 mg total) by mouth 2 (two) times daily.   buPROPion 300 MG 24 hr tablet Commonly known as: WELLBUTRIN XL Take 300 mg by mouth every morning.   CALCIUM-VITAMIN D PO Take 1 tablet by mouth daily.   clotrimazole 1 % cream Commonly known as: LOTRIMIN Apply 1 Application topically daily as needed (irritation).   GLUCOSAMINE CHONDROIT MSM DS PO Take 1 tablet by mouth in the morning and at bedtime.   HYDROCORTISONE EX Apply 1 Application topically daily as needed (itching).   hydrOXYzine 10 MG tablet Commonly known as: ATARAX Take 10 mg by mouth 3 (three) times daily as needed for itching.   multivitamin with minerals Tabs tablet Take 1 tablet by mouth daily.   oxyCODONE 5 MG immediate release tablet Commonly known as: Roxicodone Take 1 tablet (5 mg total) by mouth every 4 (four) hours as needed.   PROBIOTIC PO Take 1 capsule by mouth daily.   rosuvastatin 20 MG tablet Commonly known as: CRESTOR Take 20 mg by mouth daily.   SYSTANE OP Place 1 drop into both eyes 2 (two) times daily as needed (dry eyes).   TART CHERRY PO Take 1 capsule by mouth daily.   tiZANidine 2 MG tablet Commonly known as: ZANAFLEX Take 1 tablet (2 mg total) by mouth every 6 (six) hours as needed. What changed: reasons to take this   topiramate 50 MG tablet Commonly known as: Topamax Take 1 tablet (  50 mg total) by mouth at bedtime.   traZODone 50 MG tablet Commonly known as: DESYREL Take 50 mg by mouth at bedtime.   Vitamin D (Ergocalciferol) 1.25 MG (50000 UNIT) Caps capsule Commonly known as: DRISDOL Take 1 capsule (50,000 Units total) by mouth every 7 (seven) days.   Vitamin D (Ergocalciferol) 1.25 MG (50000 UNIT) Caps capsule Commonly known as: DRISDOL TAKE 1 CAPSULE BY MOUTH EVERY 7 DAYS        Diagnostic Studies: DG Chest 2 View Result Date: 02/21/2023 CLINICAL DATA:  77 year old female with preoperative chest x-ray EXAM: CHEST - 2 VIEW COMPARISON:  10/24/2020  FINDINGS: Cardiomediastinal silhouette unchanged in size and contour. No evidence of central vascular congestion. No interlobular septal thickening. No pneumothorax or pleural effusion. Coarsened interstitial markings, with no confluent airspace disease. No acute displaced fracture. Degenerative changes of the spine. IMPRESSION: No active cardiopulmonary disease. Electronically Signed   By: Gilmer Mor D.O.   On: 02/21/2023 10:24    Disposition: Discharge disposition: 01-Home or Self Care          Follow-up Information     Porterfield, Ryiah Bellissimo, PA-C. Schedule an appointment as soon as possible for a visit on 03/04/2023.   Specialty: Orthopedic Surgery Contact information: [address] [place] [address] [phone]                  Signed: Joice Lofts L. Porterfield, PA-C 02/24/2023, 7:33 AM

## 2023-03-04 DIAGNOSIS — M25612 Stiffness of left shoulder, not elsewhere classified: Secondary | ICD-10-CM | POA: Diagnosis not present

## 2023-03-04 DIAGNOSIS — R531 Weakness: Secondary | ICD-10-CM | POA: Diagnosis not present

## 2023-03-04 DIAGNOSIS — M25512 Pain in left shoulder: Secondary | ICD-10-CM | POA: Diagnosis not present

## 2023-03-04 DIAGNOSIS — M19011 Primary osteoarthritis, right shoulder: Secondary | ICD-10-CM | POA: Diagnosis not present

## 2023-03-04 DIAGNOSIS — M19012 Primary osteoarthritis, left shoulder: Secondary | ICD-10-CM | POA: Diagnosis not present

## 2023-03-08 DIAGNOSIS — M25612 Stiffness of left shoulder, not elsewhere classified: Secondary | ICD-10-CM | POA: Diagnosis not present

## 2023-03-08 DIAGNOSIS — R531 Weakness: Secondary | ICD-10-CM | POA: Diagnosis not present

## 2023-03-08 DIAGNOSIS — M25512 Pain in left shoulder: Secondary | ICD-10-CM | POA: Diagnosis not present

## 2023-03-14 DIAGNOSIS — R531 Weakness: Secondary | ICD-10-CM | POA: Diagnosis not present

## 2023-03-14 DIAGNOSIS — M25512 Pain in left shoulder: Secondary | ICD-10-CM | POA: Diagnosis not present

## 2023-03-14 DIAGNOSIS — M25612 Stiffness of left shoulder, not elsewhere classified: Secondary | ICD-10-CM | POA: Diagnosis not present

## 2023-03-15 DIAGNOSIS — E663 Overweight: Secondary | ICD-10-CM | POA: Diagnosis not present

## 2023-03-15 DIAGNOSIS — Z6827 Body mass index (BMI) 27.0-27.9, adult: Secondary | ICD-10-CM | POA: Diagnosis not present

## 2023-03-15 DIAGNOSIS — M1990 Unspecified osteoarthritis, unspecified site: Secondary | ICD-10-CM | POA: Diagnosis not present

## 2023-03-15 DIAGNOSIS — R7303 Prediabetes: Secondary | ICD-10-CM | POA: Diagnosis not present

## 2023-03-21 DIAGNOSIS — M25612 Stiffness of left shoulder, not elsewhere classified: Secondary | ICD-10-CM | POA: Diagnosis not present

## 2023-03-21 DIAGNOSIS — M25512 Pain in left shoulder: Secondary | ICD-10-CM | POA: Diagnosis not present

## 2023-03-21 DIAGNOSIS — R531 Weakness: Secondary | ICD-10-CM | POA: Diagnosis not present

## 2023-03-22 ENCOUNTER — Encounter: Payer: Self-pay | Admitting: Physical Medicine and Rehabilitation

## 2023-03-22 ENCOUNTER — Encounter
Payer: Medicare Other | Attending: Physical Medicine and Rehabilitation | Admitting: Physical Medicine and Rehabilitation

## 2023-03-22 VITALS — BP 114/75 | HR 69 | Ht <= 58 in | Wt 134.4 lb

## 2023-03-22 DIAGNOSIS — M25511 Pain in right shoulder: Secondary | ICD-10-CM | POA: Diagnosis not present

## 2023-03-22 DIAGNOSIS — G4700 Insomnia, unspecified: Secondary | ICD-10-CM | POA: Insufficient documentation

## 2023-03-22 DIAGNOSIS — G8929 Other chronic pain: Secondary | ICD-10-CM | POA: Insufficient documentation

## 2023-03-22 DIAGNOSIS — M25512 Pain in left shoulder: Secondary | ICD-10-CM | POA: Insufficient documentation

## 2023-03-22 DIAGNOSIS — M797 Fibromyalgia: Secondary | ICD-10-CM | POA: Insufficient documentation

## 2023-03-22 MED ORDER — TOPIRAMATE 25 MG PO TABS: 25.0000 mg | ORAL_TABLET | Freq: Two times a day (BID) | ORAL | 3 refills | Status: DC

## 2023-03-22 NOTE — Patient Instructions (Signed)
 Foods that may reduce pain: 1) Ginger (especially studied for arthritis)- reduce leukotriene production to decrease inflammation 2) Blueberries- high in phytonutrients that decrease inflammation 3) Salmon- marine omega-3s reduce joint swelling and pain 4) Pumpkin seeds- reduce inflammation 5) dark chocolate- reduces inflammation 6) turmeric- reduces inflammation 7) tart cherries - reduce pain and stiffness 8) extra virgin olive oil - its compound olecanthal helps to block prostaglandins  9) chili peppers- can be eaten or applied topically via capsaicin 10) mint- helpful for headache, muscle aches, joint pain, and itching 11) garlic- reduces inflammation  Link to further information on diet for chronic pain: [email or web address]    Insomnia: -Try to go outside near sunrise -Get exercise during the day.  -Turn off all devices an hour before bedtime.  -Teas that can benefit: chamomile, valerian root, Brahmi (Bacopa) -Can consider over the counter melatonin, magnesium, and/or L-theanine. Melatonin is an anti-oxidant with multiple health benefits. Magnesium is involved in greater than 300 enzymatic reactions in the body and most of [place] are deficient as our soil is often depleted. There are 7 different types of magnesium- Bioptemizer's is a supplement with all 7 types, and each has unique benefits. Magnesium can also help with constipation and anxiety.  -Pistachios naturally increase the production of melatonin -Cozy Earth bamboo bed sheets are free from toxic chemicals.  -Tart cherry juice or a tart cherry supplement can improve sleep and soreness post-workout

## 2023-03-22 NOTE — Progress Notes (Signed)
 Subjective:    Patient ID: Morgan Reed, female    DOB: 05-11-1945, 78 y.o.   MRN: [id number]  HPI Morgan Reed is a 78 year old woman who presents with diffuse pain.  1) Diffuse pain -has been present for a couple of years -her ANA was positive -she gets  a rash on her face and was told it is not rosacea -she was checked for lupus -she was never she told she has fibromyalgia -she takes 25mg  topamax  for weight loss but it has not helped her pain- she takes this during the day, she would be willing to try an increased dose  2) 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   3) Fatigue: -she takes 25mg  daily topamax  -she saw her vitamin D  level was normal  4) Left shoulder pain: -improved since surgery -weather worsens the pain  5) Right shoulder pain: -hurting because it is working overtime and needs the same surgery  Pain Inventory Average Pain 6 Pain Right Now 6 My pain is constant, sharp, dull, stabbing, tingling, and aching  In the last 24 hours, has pain interfered with the following? General activity 7 Relation with others 7 Enjoyment of life 7 What TIME of day is your pain at its worst? morning , daytime, evening, and night Sleep (in general) Fair  Pain is worse with: walking, standing, and some activites Pain improves with: heat/ice and therapy/exercise Relief from Meds: 5   Family History  Problem Relation Age of Onset   Cancer Mother        leukemia   Cancer Brother        liver cancer   Healthy Son    Healthy Daughter    Social History   Socioeconomic History   Marital status: Widowed    Spouse name: Not on file   Number of children: Not on file   Years of education: Not on file   Highest education level: Not on file  Occupational History   Not on file  Tobacco Use   Smoking status: Former    Current  packs/day: 0.00    Types: Cigarettes    Quit date: 58    Years since quitting: 47.0    Passive exposure: Never   Smokeless tobacco: Never  Vaping Use   Vaping status: Never Used  Substance and Sexual Activity   Alcohol  use: Not Currently    Comment: rare   none in a long time   Drug use: Not Currently   Sexual activity: Not Currently  Other Topics Concern   Not on file  Social History Narrative   Not on file   Social Drivers of Health   Financial Resource Strain: Not on file  Food Insecurity: No Food Insecurity (02/17/2023)   Hunger Vital Sign    Worried About Running Out of Food in the Last Year: Never true    Ran Out of Food in the Last Year: Never true  Transportation Needs: No Transportation Needs (02/17/2023)   PRAPARE - Administrator, Civil Service (Medical): No    Lack of Transportation (Non-Medical): No  Physical Activity: Not on file  Stress: Not on file  Social Connections: Not on file   Past Surgical History:  Procedure Laterality Date   ABDOMINAL HYSTERECTOMY  1995   complication with bowel tear  BREAST BIOPSY     right breast benign approx 1990   BREAST EXCISIONAL BIOPSY Right    right breast benign 1990   BUNIONECTOMY  2000   EYE SURGERY Bilateral 2014   cataracts   REPLACEMENT TOTAL KNEE Right 2013   REVERSE SHOULDER ARTHROPLASTY Left 02/17/2023   Procedure: REVERSE SHOULDER ARTHROPLASTY;  Surgeon: Dozier Soulier, MD;  Location: [facility];  Service: Orthopedics;  Laterality: Left;   TONSILLECTOMY     as a child   TOTAL KNEE REVISION Right 12/01/2020   Procedure: RIGHT TOTAL KNEE REVISION;  Surgeon: Liam Lerner, MD;  Location: [facility];  Service: Orthopedics;  Laterality: Right;   Past Medical History:  Diagnosis Date   Anxiety    Arthritis    knees, hands  osteoarthritis   Fibromyalgia    Insomnia    Lupus anticoagulant positive    undefined after extensive testing since 2022  abnormal coagulation profile   Osteopenia     Pre-diabetes    BP 114/75   Pulse 69   Ht 4' 9.5 (1.461 m)   Wt 134 lb 6.4 oz (61 kg)   SpO2 96%   BMI 28.58 kg/m   Opioid Risk Score:   Fall Risk Score:  `1  Depression screen [facility] 2/9     03/22/2023   12:56 PM 12/20/2022    1:16 PM  Depression screen PHQ 2/9  Decreased Interest 0 2  Down, Depressed, Hopeless 0 2  PHQ - 2 Score 0 4  Altered sleeping  1  Tired, decreased energy  3  Change in appetite  0  Feeling bad or failure about yourself   2  Trouble concentrating  0  Moving slowly or fidgety/restless  0  Suicidal thoughts  0  PHQ-9 Score  10  Difficult doing work/chores  Somewhat difficult     Review of Systems  Neurological:  Positive for tremors and weakness.  All other systems reviewed and are negative.      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  -Discussed benefits of exercise in reducing pain. -start aquatherapy, discussed that she finds the warmth of the water  comforting, discussed that exercise is one of the most beneficial treatments for fibromyalgia -continue topamax  50mg  HS -food allergy testing ordered -discussed that she does not feel depression but feels discussed with her body -Discussed following foods that may reduce pain: 1) Ginger (especially studied for arthritis)- reduce leukotriene production to decrease inflammation 2) Blueberries- high in phytonutrients that decrease inflammation 3) Salmon- marine omega-3s reduce joint swelling and pain 4) Pumpkin seeds- reduce inflammation 5) dark  chocolate- reduces inflammation 6) turmeric- reduces inflammation 7) tart cherries - reduce pain and stiffness 8) extra virgin olive oil - its compound olecanthal helps to block prostaglandins  9) chili peppers- can be eaten or applied topically via capsaicin 10) mint- helpful for headache, muscle aches, joint pain, and itching 11) garlic- reduces inflammation  Link to further information on diet for chronic pain: http://www.bray.com/   Turmeric to reduce inflammation--can be used in cooking or taken as a supplement.  Benefits  of turmeric:  -Highly anti-inflammatory  -Increases antioxidants  -Improves memory, attention, brain disease  -Lowers risk of heart disease  -May help prevent cancer  -Decreases pain  -Alleviates depression  -Delays aging and decreases risk of chronic disease  -Consume with black pepper to increase absorption    Turmeric Milk Recipe:  1 cup milk  1 tsp turmeric  1 tsp cinnamon  1 tsp grated ginger (optional)  Black pepper (boosts the anti-inflammatory properties of turmeric).  1 tsp honey   2) s/p right TKA revision: -discussed that she continues to have pain here  3) Bilateral shoulder pain: -discussed that she been told she would benefit from shoulder replacements  4) Insomnia: -increase topamax  to 75mg  HS -Try to go outside near sunrise -Get exercise during the day.  -Turn off all devices an hour before bedtime.  -Teas that can benefit: chamomile, valerian root, Brahmi (Bacopa) -Can consider over the counter melatonin, magnesium, and/or L-theanine. Melatonin is an anti-oxidant with multiple health benefits. Magnesium is involved in greater than 300 enzymatic reactions in the body and most of us  are deficient as our soil is often depleted. There are 7 different types of magnesium- Bioptemizer's is a supplement with all 7 types, and each has unique benefits.  Magnesium can also help with constipation and anxiety.  -Pistachios naturally increase the production of melatonin -Cozy Earth bamboo bed sheets are free from toxic chemicals.  -Tart cherry juice or a tart cherry supplement can improve sleep and soreness post-workout    5) Fatigue:  -continue vitamin D  supplement -recommended high dose ergocalciferol  50,000U once per week on top of this as vitamin D  level is in supoptimal range, discussed changing topamax  to 50mg  at night   6) s/p left shoulder surgery -discussed positive response to left shoulder surgery  7) Right shoulder pain: -continue heat  Prescribing Home Zynex NexWave Stimulator Device and supplies as needed. IFC, NMES and TENS medically necessary Treatment Rx: Daily @ 30-40 minutes per treatment PRN. Zynex NexWave only, no substitutions. Treatment Goals: 1) To reduce and/or eliminate pain 2) To improve functional capacity and Activities of daily living 3) To reduce or prevent the need for oral medications 4) To improve circulation in the injured region 5) To decrease or prevent muscle spasm and muscle atrophy 6) To provide a self-management tool to the patient The patient has not sufficiently improved with conservative care. Numerous studies indexed by Medline and PubMed.gov have shown Neuromuscular, Interferential, and TENS stimulators to reduce pain, improve function, and reduce medication use in injured patients. Continued use of this evidence based, safe, drug free treatment is both reasonable and medically necessary at this time.    Discussed benefits of sauna -discussed that she is unable to tolerate exercises shoulder  -Discussed current symptoms of pain and history of pain.  -Discussed benefits of exercise in reducing pain. -Discussed following foods that may reduce pain: 1) Ginger (especially studied for arthritis)- reduce leukotriene production to decrease inflammation 2) Blueberries- high in phytonutrients that decrease  inflammation 3) Salmon- marine omega-3s reduce joint swelling and pain 4) Pumpkin seeds- reduce inflammation 5) dark chocolate- reduces inflammation 6) turmeric- reduces inflammation 7) tart cherries - reduce pain and stiffness 8) extra virgin olive oil - its compound olecanthal helps to block prostaglandins  9) chili peppers- can be eaten or applied topically via capsaicin 10) mint- helpful for headache, muscle aches, joint pain, and itching 11) garlic- reduces inflammation  Link to further information on diet for chronic pain: http://www.bray.com/

## 2023-03-23 DIAGNOSIS — R531 Weakness: Secondary | ICD-10-CM | POA: Diagnosis not present

## 2023-03-23 DIAGNOSIS — M25612 Stiffness of left shoulder, not elsewhere classified: Secondary | ICD-10-CM | POA: Diagnosis not present

## 2023-03-23 DIAGNOSIS — M25512 Pain in left shoulder: Secondary | ICD-10-CM | POA: Diagnosis not present

## 2023-03-24 ENCOUNTER — Other Ambulatory Visit (HOSPITAL_COMMUNITY): Payer: Self-pay

## 2023-03-28 DIAGNOSIS — M25512 Pain in left shoulder: Secondary | ICD-10-CM | POA: Diagnosis not present

## 2023-03-28 DIAGNOSIS — R531 Weakness: Secondary | ICD-10-CM | POA: Diagnosis not present

## 2023-03-28 DIAGNOSIS — M25612 Stiffness of left shoulder, not elsewhere classified: Secondary | ICD-10-CM | POA: Diagnosis not present

## 2023-03-30 DIAGNOSIS — R531 Weakness: Secondary | ICD-10-CM | POA: Diagnosis not present

## 2023-03-30 DIAGNOSIS — M25612 Stiffness of left shoulder, not elsewhere classified: Secondary | ICD-10-CM | POA: Diagnosis not present

## 2023-03-30 DIAGNOSIS — M25512 Pain in left shoulder: Secondary | ICD-10-CM | POA: Diagnosis not present

## 2023-04-05 DIAGNOSIS — M25512 Pain in left shoulder: Secondary | ICD-10-CM | POA: Diagnosis not present

## 2023-04-05 DIAGNOSIS — R531 Weakness: Secondary | ICD-10-CM | POA: Diagnosis not present

## 2023-04-05 DIAGNOSIS — M25612 Stiffness of left shoulder, not elsewhere classified: Secondary | ICD-10-CM | POA: Diagnosis not present

## 2023-04-06 ENCOUNTER — Other Ambulatory Visit: Payer: Self-pay | Admitting: *Deleted

## 2023-04-06 ENCOUNTER — Telehealth: Payer: Self-pay | Admitting: Rheumatology

## 2023-04-06 DIAGNOSIS — G8929 Other chronic pain: Secondary | ICD-10-CM

## 2023-04-06 DIAGNOSIS — M7062 Trochanteric bursitis, left hip: Secondary | ICD-10-CM

## 2023-04-06 NOTE — Telephone Encounter (Signed)
Patient called requesting another referral to [facility] for aquatic therapy for her arthritis pain.

## 2023-04-06 NOTE — Telephone Encounter (Signed)
Ok to place new referral for PT at [facility]

## 2023-04-06 NOTE — Telephone Encounter (Signed)
Referral placed, I called patient.

## 2023-04-11 DIAGNOSIS — R531 Weakness: Secondary | ICD-10-CM | POA: Diagnosis not present

## 2023-04-11 DIAGNOSIS — M25612 Stiffness of left shoulder, not elsewhere classified: Secondary | ICD-10-CM | POA: Diagnosis not present

## 2023-04-11 DIAGNOSIS — M25512 Pain in left shoulder: Secondary | ICD-10-CM | POA: Diagnosis not present

## 2023-04-13 DIAGNOSIS — M25512 Pain in left shoulder: Secondary | ICD-10-CM | POA: Diagnosis not present

## 2023-04-13 DIAGNOSIS — M25612 Stiffness of left shoulder, not elsewhere classified: Secondary | ICD-10-CM | POA: Diagnosis not present

## 2023-04-13 DIAGNOSIS — R531 Weakness: Secondary | ICD-10-CM | POA: Diagnosis not present

## 2023-04-18 DIAGNOSIS — M25512 Pain in left shoulder: Secondary | ICD-10-CM | POA: Diagnosis not present

## 2023-04-18 DIAGNOSIS — M25612 Stiffness of left shoulder, not elsewhere classified: Secondary | ICD-10-CM | POA: Diagnosis not present

## 2023-04-18 DIAGNOSIS — R531 Weakness: Secondary | ICD-10-CM | POA: Diagnosis not present

## 2023-04-19 DIAGNOSIS — M1712 Unilateral primary osteoarthritis, left knee: Secondary | ICD-10-CM | POA: Diagnosis not present

## 2023-04-20 DIAGNOSIS — R531 Weakness: Secondary | ICD-10-CM | POA: Diagnosis not present

## 2023-04-20 DIAGNOSIS — M25612 Stiffness of left shoulder, not elsewhere classified: Secondary | ICD-10-CM | POA: Diagnosis not present

## 2023-04-20 DIAGNOSIS — M25512 Pain in left shoulder: Secondary | ICD-10-CM | POA: Diagnosis not present

## 2023-04-25 DIAGNOSIS — M25612 Stiffness of left shoulder, not elsewhere classified: Secondary | ICD-10-CM | POA: Diagnosis not present

## 2023-04-25 DIAGNOSIS — R531 Weakness: Secondary | ICD-10-CM | POA: Diagnosis not present

## 2023-04-25 DIAGNOSIS — M25512 Pain in left shoulder: Secondary | ICD-10-CM | POA: Diagnosis not present

## 2023-04-29 DIAGNOSIS — M25512 Pain in left shoulder: Secondary | ICD-10-CM | POA: Diagnosis not present

## 2023-04-29 DIAGNOSIS — R531 Weakness: Secondary | ICD-10-CM | POA: Diagnosis not present

## 2023-04-29 DIAGNOSIS — M25612 Stiffness of left shoulder, not elsewhere classified: Secondary | ICD-10-CM | POA: Diagnosis not present

## 2023-05-02 DIAGNOSIS — R531 Weakness: Secondary | ICD-10-CM | POA: Diagnosis not present

## 2023-05-02 DIAGNOSIS — M25512 Pain in left shoulder: Secondary | ICD-10-CM | POA: Diagnosis not present

## 2023-05-02 DIAGNOSIS — M25612 Stiffness of left shoulder, not elsewhere classified: Secondary | ICD-10-CM | POA: Diagnosis not present

## 2023-05-06 DIAGNOSIS — M25512 Pain in left shoulder: Secondary | ICD-10-CM | POA: Diagnosis not present

## 2023-05-06 DIAGNOSIS — R531 Weakness: Secondary | ICD-10-CM | POA: Diagnosis not present

## 2023-05-06 DIAGNOSIS — M25612 Stiffness of left shoulder, not elsewhere classified: Secondary | ICD-10-CM | POA: Diagnosis not present

## 2023-05-09 DIAGNOSIS — R531 Weakness: Secondary | ICD-10-CM | POA: Diagnosis not present

## 2023-05-09 DIAGNOSIS — M25512 Pain in left shoulder: Secondary | ICD-10-CM | POA: Diagnosis not present

## 2023-05-09 DIAGNOSIS — M25612 Stiffness of left shoulder, not elsewhere classified: Secondary | ICD-10-CM | POA: Diagnosis not present

## 2023-05-13 DIAGNOSIS — M25612 Stiffness of left shoulder, not elsewhere classified: Secondary | ICD-10-CM | POA: Diagnosis not present

## 2023-05-13 DIAGNOSIS — R531 Weakness: Secondary | ICD-10-CM | POA: Diagnosis not present

## 2023-05-13 DIAGNOSIS — M25512 Pain in left shoulder: Secondary | ICD-10-CM | POA: Diagnosis not present

## 2023-05-16 ENCOUNTER — Telehealth: Payer: Self-pay | Admitting: Rheumatology

## 2023-05-16 ENCOUNTER — Telehealth: Payer: Self-pay | Admitting: Physical Medicine and Rehabilitation

## 2023-05-16 DIAGNOSIS — M19011 Primary osteoarthritis, right shoulder: Secondary | ICD-10-CM | POA: Diagnosis not present

## 2023-05-16 DIAGNOSIS — M25512 Pain in left shoulder: Secondary | ICD-10-CM | POA: Diagnosis not present

## 2023-05-16 NOTE — Telephone Encounter (Signed)
 Patient called in to inform Dr Carlis Abbott that she will be having R shoulder surgery and doctor will send our office a request to take over medication care for patient.

## 2023-05-16 NOTE — Telephone Encounter (Signed)
 Pt called wanting to let Dr. Corliss Skains that she is about to have a surgery for her right rotator cuff. Pt does not know the date yet. PT saw the dr today.

## 2023-05-18 ENCOUNTER — Encounter: Payer: Self-pay | Admitting: *Deleted

## 2023-05-19 DIAGNOSIS — M1712 Unilateral primary osteoarthritis, left knee: Secondary | ICD-10-CM | POA: Diagnosis not present

## 2023-05-24 DIAGNOSIS — M25612 Stiffness of left shoulder, not elsewhere classified: Secondary | ICD-10-CM | POA: Diagnosis not present

## 2023-05-24 DIAGNOSIS — R531 Weakness: Secondary | ICD-10-CM | POA: Diagnosis not present

## 2023-05-24 DIAGNOSIS — M25512 Pain in left shoulder: Secondary | ICD-10-CM | POA: Diagnosis not present

## 2023-05-25 DIAGNOSIS — M25512 Pain in left shoulder: Secondary | ICD-10-CM | POA: Diagnosis not present

## 2023-05-25 DIAGNOSIS — M25612 Stiffness of left shoulder, not elsewhere classified: Secondary | ICD-10-CM | POA: Diagnosis not present

## 2023-05-25 DIAGNOSIS — R531 Weakness: Secondary | ICD-10-CM | POA: Diagnosis not present

## 2023-05-26 DIAGNOSIS — Z01818 Encounter for other preprocedural examination: Secondary | ICD-10-CM | POA: Diagnosis not present

## 2023-05-26 DIAGNOSIS — R7303 Prediabetes: Secondary | ICD-10-CM | POA: Diagnosis not present

## 2023-05-26 DIAGNOSIS — M1712 Unilateral primary osteoarthritis, left knee: Secondary | ICD-10-CM | POA: Diagnosis not present

## 2023-05-26 DIAGNOSIS — G8929 Other chronic pain: Secondary | ICD-10-CM | POA: Diagnosis not present

## 2023-05-26 DIAGNOSIS — E785 Hyperlipidemia, unspecified: Secondary | ICD-10-CM | POA: Diagnosis not present

## 2023-05-26 DIAGNOSIS — M25511 Pain in right shoulder: Secondary | ICD-10-CM | POA: Diagnosis not present

## 2023-05-26 DIAGNOSIS — R9431 Abnormal electrocardiogram [ECG] [EKG]: Secondary | ICD-10-CM | POA: Diagnosis not present

## 2023-05-30 ENCOUNTER — Encounter: Payer: Self-pay | Admitting: Physical Medicine and Rehabilitation

## 2023-05-30 DIAGNOSIS — M25512 Pain in left shoulder: Secondary | ICD-10-CM | POA: Diagnosis not present

## 2023-05-30 DIAGNOSIS — M25612 Stiffness of left shoulder, not elsewhere classified: Secondary | ICD-10-CM | POA: Diagnosis not present

## 2023-05-30 DIAGNOSIS — R531 Weakness: Secondary | ICD-10-CM | POA: Diagnosis not present

## 2023-05-31 ENCOUNTER — Telehealth: Payer: Self-pay

## 2023-05-31 ENCOUNTER — Other Ambulatory Visit: Payer: Self-pay | Admitting: Orthopedic Surgery

## 2023-05-31 NOTE — Telephone Encounter (Signed)
 Primary Cardiologist:None  Chart reviewed as part of pre-operative protocol coverage. Because of Glynn Freas Whyte's past medical history and time since last visit, he/she will require a follow-up visit in order to better assess preoperative cardiovascular risk.  Pre-op covering staff: - Patient has upcoming appointment with Dr. Tereso Newcomer as new pt at which time clearance can be addressed. - Please contact requesting surgeon's office via preferred method (i.e, phone, fax) to inform them of need for appointment prior to surgery.  Levi Aland, NP-C  05/31/2023, 4:35 PM [address] Office [phone] Fax [phone]

## 2023-05-31 NOTE — Telephone Encounter (Signed)
   Pre-operative Risk Assessment    Patient Name: Morgan Reed  DOB: [birth year] MRN: [id number]   Date of last office visit: NONE Date of next office visit: 06/03/23 Carolan Clines, MD   Request for Surgical Clearance    Procedure:   RIGHT REVERSE TOTAL SHOULDER ARTHROPLASTY  Date of Surgery:  Clearance 06/09/23                                Surgeon:  Ubaldo Glassing. Ave Filter, MD Surgeon's Group or Practice Name:  [facility] Phone number:  [phone] Fax number:  [phone]   Type of Clearance Requested:   - Medical  - Pharmacy:  Hold Aspirin     Type of Anesthesia:   CHOICE   Additional requests/questions:    SignedMarlow Baars   05/31/2023, 4:11 PM

## 2023-06-01 NOTE — Progress Notes (Signed)
  Cardiology Office Note:  .   Date:  06/01/2023  ID:  Morgan Reed, DOB [birth year], MRN [id number] PCP: Wilfrid Lund, PA  [facility] Providers Cardiologist:  None    History of Present Illness: .   Morgan Reed is a 78 y.o. female with history of obesity, lupus anticoagulant, fibromyalgia, no history of cardiac disease whom cardiology was asked to conduct a preoperative risk assessment prior to right reverse total shoulder arthroplasty.  She has no prior cardiovascular disease history. Her EKG was noted to have PACs per patient. She drinks caffeine.  She lost 30 pounds in a year. She had surgery for her L shoulder in December 2024 and that went well. She denies angina, dyspnea on exertion, lower extremity edema, PND or orthopnea.    ROS:  per HPI otherwise negative   Studies Reviewed: Marland Kitchen       EKG Interpretation Date/Time:  Friday June 03 2023 13:59:34 EDT Ventricular Rate:  63 PR Interval:  152 QRS Duration:  74 QT Interval:  388 QTC Calculation: 397 R Axis:   -28  Text Interpretation: Sinus rhythm with Premature atrial complexes When compared with ECG of 14-Feb-2023 15:01, Premature atrial complexes are now Present Confirmed by Carolan Clines ([id number]) on 06/03/2023 2:00:21 PM   Risk Assessment/Calculations:    Physical Exam:   VS:  Vitals:   06/03/23 1343  BP: 122/78  Pulse: 86  SpO2: 97%    Wt Readings from Last 3 Encounters:  03/22/23 134 lb 6.4 oz (61 kg)  02/17/23 132 lb (59.9 kg)  02/14/23 132 lb (59.9 kg)    GEN: Well nourished, well developed in no acute distress NECK: No JVD CARDIAC: RRR with ectopy, no murmurs, rubs, gallops RESPIRATORY:  Clear to auscultation without rales, wheezing or rhonchi  ABDOMEN: Soft, non-tender, non-distended EXTREMITIES:  No edema; No deformity   ASSESSMENT AND PLAN: .   PreOp She can conduct  > 4 METS without shortness of breath or chest pressure. She is low cardaic risk for intermediate risk surgery. RCRI < 5 % for  major adverse cardiac events she is acceptable cardiac risk for for right reverse total shoulder arthroplasty.  PACs As her primary care stated PACs are benign.  If they become problematic for her and she is symptomatic she can reduce caffeine.        Dispo: Follow-up as needed  Signed, Endya Austin, Alben Spittle, MD

## 2023-06-01 NOTE — Care Plan (Signed)
 Ortho Bundle Case Management Note  Patient Details  Name: Morgan Reed MRN: [id number] Date of Birth: [birth year]   spoke with patient. she will discharge to home with family to assist. no DME needed. OPPT set up with [facility] [facility] discharge instructions mail. questions answered.   DME Arranged:    DME Agency:     HH Arranged:    HH Agency:     Additional Comments: Please contact me with any questions of if this plan should need to change.  Shauna Hugh,  RN,BSN,MHA,CCM  [facility]  [phone] 06/01/2023, 11:35 AM

## 2023-06-01 NOTE — Telephone Encounter (Signed)
 Pre op was added to upcoming new pt appt. Faxed to Dr. Veda Canning office to [facility].  Will remove from pre op pool.

## 2023-06-02 DIAGNOSIS — R531 Weakness: Secondary | ICD-10-CM | POA: Diagnosis not present

## 2023-06-02 DIAGNOSIS — M25512 Pain in left shoulder: Secondary | ICD-10-CM | POA: Diagnosis not present

## 2023-06-02 DIAGNOSIS — M25612 Stiffness of left shoulder, not elsewhere classified: Secondary | ICD-10-CM | POA: Diagnosis not present

## 2023-06-03 ENCOUNTER — Encounter: Payer: Self-pay | Admitting: Internal Medicine

## 2023-06-03 ENCOUNTER — Ambulatory Visit: Attending: Internal Medicine | Admitting: Internal Medicine

## 2023-06-03 VITALS — BP 122/78 | HR 86 | Ht <= 58 in | Wt 135.4 lb

## 2023-06-03 DIAGNOSIS — R9431 Abnormal electrocardiogram [ECG] [EKG]: Secondary | ICD-10-CM | POA: Insufficient documentation

## 2023-06-03 DIAGNOSIS — E785 Hyperlipidemia, unspecified: Secondary | ICD-10-CM | POA: Insufficient documentation

## 2023-06-03 NOTE — Patient Instructions (Addendum)
 Mrs. Stjames EKG showed sinus rhythm with benign premature atrial contractions.  She is acceptable cardiac risk for shoulder arthroplasty. She does not need any further cardiac work up.   Medication Instructions:  NO CHANGES    Lab Work: NONE   Testing/Procedures: NONE  Follow-Up: At [facility], you and your health needs are our priority.  As part of our continuing mission to provide you with exceptional heart care, our providers are all part of one team.  This team includes your primary Cardiologist (physician) and Advanced Practice Providers or APPs (Physician Assistants and Nurse Practitioners) who all work together to provide you with the care you need, when you need it.  Your next appointment:   AS NEEDED  Provider:   Theresia Bough   Other Instructions:       1st Floor: - Lobby - Registration  - Pharmacy  - Lab - Cafe  2nd Floor: - PV Lab - Diagnostic Testing (echo, CT, nuclear med)  3rd Floor: - Vacant  4th Floor: - TCTS (cardiothoracic surgery) - AFib Clinic - Structural Heart Clinic - Vascular Surgery  - Vascular Ultrasound  5th Floor: - HeartCare Cardiology (general and EP) - Clinical Pharmacy for coumadin, hypertension, lipid, weight-loss medications, and med management appointments    Valet parking services will be available as well.

## 2023-06-06 NOTE — Progress Notes (Signed)
 COVID Vaccine received:  []  No [x]  Yes Date of any COVID positive Test in last 90 days: no PCP - Carilyn Goodpasture PA Cardiologist - Carolan Clines MD  Chest x-ray - 02/14/23 Epic EKG -  06/03/23 Epic Stress Test -  ECHO -  Cardiac Cath -   Cardiac clearance 06/03/23 Dr. Carolan Clines  Bowel Prep - [x]  No  []   Yes ______  Pacemaker / ICD device [x]  No []  Yes   Spinal Cord Stimulator:[x]  No []  Yes       History of Sleep Apnea? [x]  No []  Yes   CPAP used?- [x]  No []  Yes    Does the patient monitor blood sugar?          [x]  No []  Yes  []  N/A  Patient has: [x]  NO Hx DM   []  Pre-DM                 []  DM1  []   DM2 Does patient have a [employer] or Dexacom? []  No []  Yes   Fasting Blood Sugar Ranges-  Checks Blood Sugar _____ times a day  GLP1 agonist / usual dose - no GLP1 instructions:  SGLT-2 inhibitors / usual dose - no SGLT-2 instructions:   Blood Thinner / Instructions: Aspirin Instructions:81 mg Will hold starting today. Last dose 06/07/23  Comments:   Activity level: Patient is ablet o climb a flight of stairs without difficulty; [x]  No CP  [x]  No SOB,   Patient can /  perform ADLs without assistance.   Anesthesia review: Lupus coagulation positive, PAC's present on EKG from MD office.  Patient denies shortness of breath, fever, cough and chest pain at PAT appointment.  Patient verbalized understanding and agreement to the Pre-Surgical Instructions that were given to them at this PAT appointment. Patient was also educated of the need to review these PAT instructions again prior to his/her surgery.I reviewed the appropriate phone numbers to call if they have any and questions or concerns.

## 2023-06-06 NOTE — Patient Instructions (Signed)
 SURGICAL WAITING ROOM VISITATION  Patients having surgery or a procedure may have no more than 2 support people in the waiting area - these visitors may rotate.    Children under the age of 82 must have an adult with them who is not the patient.  Due to an increase in RSV and influenza rates and associated hospitalizations, children ages 42 and under may not visit patients in [facility] hospitals.  Visitors with respiratory illnesses are discouraged from visiting and should remain at home.  If the patient needs to stay at the hospital during part of their recovery, the visitor guidelines for inpatient rooms apply. Pre-op nurse will coordinate an appropriate time for 1 support person to accompany patient in pre-op.  This support person may not rotate.    Please refer to the [facility] website for the visitor guidelines for Inpatients (after your surgery is over and you are in a regular room).       Your procedure is scheduled on: 06/09/23   Report to [facility]    Report to admitting at 5:15 AM   Call this number if you have problems the morning of surgery [phone]   Do not eat food :After Midnight.   After Midnight you may have the following liquids until 4:30 AM DAY OF SURGERY  Water Non-Citrus Juices (without pulp, NO RED-Apple, White grape, White cranberry) Black Coffee (NO MILK/CREAM OR CREAMERS, sugar ok)  Clear Tea (NO MILK/CREAM OR CREAMERS, sugar ok) regular and decaf                             Plain Jell-O (NO RED)                                           Fruit ices (not with fruit pulp, NO RED)                                     Popsicles (NO RED)                                                               Sports drinks like Gatorade (NO RED)                  The day of surgery:  Drink ONE (1) Pre-Surgery Clear Ensure at 4:30 AM the morning of surgery. Drink in one sitting. Do not sip.  This drink was given to you during your  hospital  pre-op appointment visit. Nothing else to drink after completing the  Pre-Surgery Clear Ensure       Oral Hygiene is also important to reduce your risk of infection.                                    Remember - BRUSH YOUR TEETH THE MORNING OF SURGERY WITH YOUR REGULAR TOOTHPASTE  DENTURES WILL BE REMOVED PRIOR TO SURGERY PLEASE DO NOT APPLY "Poly grip" OR ADHESIVES!!!  Stop all vitamins and herbal supplements 7 days before surgery.   Take these medicines the morning of surgery with A SIP OF WATER: Tylenol, Wellbutrin, Loratadine(claritin), Rosuvastatin             You may not have any metal on your body including hair pins, jewelry, and body piercing             Do not wear make-up, lotions, powders, perfumes/cologne, or deodorant  Do not wear nail polish including gel and S&S, artificial/acrylic nails, or any other type of covering on natural nails including finger and toenails. If you have artificial nails, gel coating, etc. that needs to be removed by a nail salon please have this removed prior to surgery or surgery may need to be canceled/ delayed if the surgeon/ anesthesia feels like they are unable to be safely monitored.   Do not shave  48 hours prior to surgery.    Do not bring valuables to the hospital. [place] IS NOT             RESPONSIBLE   FOR VALUABLES.   Contacts, glasses, dentures or bridgework may not be worn into surgery.   Bring small overnight bag day of surgery.   DO NOT BRING YOUR HOME MEDICATIONS TO THE HOSPITAL. PHARMACY WILL DISPENSE MEDICATIONS LISTED ON YOUR MEDICATION LIST TO YOU DURING YOUR ADMISSION IN THE HOSPITAL!    Patients discharged on the day of surgery will not be allowed to drive home.  Someone NEEDS to stay with you for the first 24 hours after anesthesia.   Special Instructions: Bring a copy of your healthcare power of attorney and living will documents the day of surgery if you haven't scanned them before.               Please read over the following fact sheets you were given: IF YOU HAVE QUESTIONS ABOUT YOUR PRE-OP INSTRUCTIONS PLEASE CALL [phone] Morgan Reed   If you received a COVID test during your pre-op visit  it is requested that you wear a mask when out in public, stay away from anyone that may not be feeling well and notify your surgeon if you develop symptoms. If you test positive for Covid or have been in contact with anyone that has tested positive in the last 10 days please notify you surgeon.      Pre-operative 5 CHG Reed Instructions   You can play a key role in reducing the risk of infection after surgery. Your skin needs to be as free of germs as possible. You can reduce the number of germs on your skin by washing with CHG (chlorhexidine gluconate) soap before surgery. CHG is an antiseptic soap that kills germs and continues to kill germs even after washing.   DO NOT use if you have an allergy to chlorhexidine/CHG or antibacterial soaps. If your skin becomes reddened or irritated, stop using the CHG and notify one of our RNs at [phone].   Please shower with the CHG soap starting 4 days before surgery using the following schedule:     Please keep in mind the following:  DO NOT shave, including legs and underarms, starting the day of your first shower.   You may shave your face at any point before/day of surgery.  Place clean sheets on your bed the day you start using CHG soap. Use a clean washcloth (not used since being washed) for each shower. DO NOT sleep with pets once you start using the CHG.  CHG Shower Instructions:  If you choose to wash your hair and private area, wash first with your normal shampoo/soap.  After you use shampoo/soap, rinse your hair and body thoroughly to remove shampoo/soap residue.  Turn the water OFF and apply about 3 tablespoons (45 ml) of CHG soap to a CLEAN washcloth.  Apply CHG soap ONLY FROM YOUR NECK DOWN TO YOUR TOES (washing for 3-5 minutes)  DO  NOT use CHG soap on face, private areas, open wounds, or sores.  Pay special attention to the area where your surgery is being performed.  If you are having back surgery, having someone wash your back for you may be helpful. Wait 2 minutes after CHG soap is applied, then you may rinse off the CHG soap.  Pat dry with a clean towel  Put on clean clothes/pajamas   If you choose to wear lotion, please use ONLY the CHG-compatible lotions on the back of this paper.     Additional instructions for the day of surgery: DO NOT APPLY any lotions, deodorants, cologne, or perfumes.   Put on clean/comfortable clothes.  Brush your teeth.  Ask your nurse before applying any prescription medications to the skin.      CHG Compatible Lotions   Aveeno Moisturizing lotion  Cetaphil Moisturizing Cream  Cetaphil Moisturizing Lotion  Clairol Herbal Essence Moisturizing Lotion, Dry Skin  Clairol Herbal Essence Moisturizing Lotion, Extra Dry Skin  Clairol Herbal Essence Moisturizing Lotion, Normal Skin  Curel Age Defying Therapeutic Moisturizing Lotion with Alpha Hydroxy  Curel Extreme Care Body Lotion  Curel Soothing Hands Moisturizing Hand Lotion  Curel Therapeutic Moisturizing Cream, Fragrance-Free  Curel Therapeutic Moisturizing Lotion, Fragrance-Free  Curel Therapeutic Moisturizing Lotion, Original Formula  Eucerin Daily Replenishing Lotion  Eucerin Dry Skin Therapy Plus Alpha Hydroxy Crme  Eucerin Dry Skin Therapy Plus Alpha Hydroxy Lotion  Eucerin Original Crme  Eucerin Original Lotion  Eucerin Plus Crme Eucerin Plus Lotion  Eucerin TriLipid Replenishing Lotion  Keri Anti-Bacterial Hand Lotion  Keri Deep Conditioning Original Lotion Dry Skin Formula Softly Scented  Keri Deep Conditioning Original Lotion, Fragrance Free Sensitive Skin Formula  Keri Lotion Fast Absorbing Fragrance Free Sensitive Skin Formula  Keri Lotion Fast Absorbing Softly Scented Dry Skin Formula  Keri Original Lotion   Keri Skin Renewal Lotion Keri Silky Smooth Lotion  Keri Silky Smooth Sensitive Skin Lotion  Nivea Body Creamy Conditioning Oil  Nivea Body Extra Enriched Lotion  Nivea Body Original Lotion  Nivea Body Sheer Moisturizing Lotion Nivea Crme  Nivea Skin Firming Lotion  NutraDerm 30 Skin Lotion  NutraDerm Skin Lotion  NutraDerm Therapeutic Skin Cream  NutraDerm Therapeutic Skin Lotion  ProShield Protective Hand Cream   Incentive Spirometer  An incentive spirometer is a tool that can help keep your lungs clear and active. This tool measures how well you are filling your lungs with each breath. Taking long deep breaths may help reverse or decrease the chance of developing breathing (pulmonary) problems (especially infection) following: A long period of time when you are unable to move or be active. BEFORE THE PROCEDURE  If the spirometer includes an indicator to show your best effort, your nurse or respiratory therapist will set it to a desired goal. If possible, sit up straight or lean slightly forward. Try not to slouch. Hold the incentive spirometer in an upright position. INSTRUCTIONS FOR USE  Sit on the edge of your bed if possible, or sit up as far as you can in bed or on a chair. Hold  the incentive spirometer in an upright position. Breathe out normally. Place the mouthpiece in your mouth and seal your lips tightly around it. Breathe in slowly and as deeply as possible, raising the piston or the ball toward the top of the column. Hold your breath for 3-5 seconds or for as long as possible. Allow the piston or ball to fall to the bottom of the column. Remove the mouthpiece from your mouth and breathe out normally. Rest for a few seconds and repeat Steps 1 through 7 at least 10 times every 1-2 hours when you are awake. Take your time and take a few normal breaths between deep breaths. The spirometer may include an indicator to show your best effort. Use the indicator as a goal to work  toward during each repetition. After each set of 10 deep breaths, practice coughing to be sure your lungs are clear. If you have an incision (the cut made at the time of surgery), support your incision when coughing by placing a pillow or rolled up towels firmly against it. Once you are able to get out of bed, walk around indoors and cough well. You may stop using the incentive spirometer when instructed by your caregiver.  RISKS AND COMPLICATIONS Take your time so you do not get dizzy or light-headed. If you are in pain, you may need to take or ask for pain medication before doing incentive spirometry. It is harder to take a deep breath if you are having pain. AFTER USE Rest and breathe slowly and easily. It can be helpful to keep track of a log of your progress. Your caregiver can provide you with a simple table to help with this. If you are using the spirometer at home, follow these instructions: SEEK MEDICAL CARE IF:  You are having difficultly using the spirometer. You have trouble using the spirometer as often as instructed. Your pain medication is not giving enough relief while using the spirometer. You develop fever of 100.5 F (38.1 C) or higher. SEEK IMMEDIATE MEDICAL CARE IF:  You cough up bloody sputum that had not been present before. You develop fever of 102 F (38.9 C) or greater. You develop worsening pain at or near the incision site. MAKE SURE YOU:  Understand these instructions. Will watch your condition. Will get help right away if you are not doing well or get worse. Document Released: 07/05/2006 Document Revised: 05/17/2011 Document Reviewed: 09/05/2006 [facility] Patient Information 2014 [place], [place]- Preparing for Total Shoulder Arthroplasty    Before surgery, you can play an important role. Because skin is not sterile, your skin needs to be as free of germs as possible. You can reduce the number of germs on your skin by using the following  products. Benzoyl Peroxide Gel Reduces the number of germs present on the skin Applied twice a day to shoulder area starting two days before surgery    ==================================================================  Please follow these instructions carefully:  BENZOYL PEROXIDE 5% GEL  Please do not use if you have an allergy to benzoyl peroxide.   If your skin becomes reddened/irritated stop using the benzoyl peroxide.  Starting two days before surgery, apply as follows: Apply benzoyl peroxide in the morning and at night. Apply after taking a shower. If you are not taking a shower clean entire shoulder front, back, and side along with the armpit with a clean wet washcloth.  Place a quarter-sized dollop on your shoulder and rub in thoroughly, making sure to cover the front, back, and  side of your shoulder, along with the armpit.   2 days before ____ AM   ____ PM              1 day before ____ AM   ____ PM                         Do this twice a day for two days.  (Last application is the night before surgery, AFTER using the CHG soap as described below).  Do NOT apply benzoyl peroxide gel on the day of surgery.

## 2023-06-07 ENCOUNTER — Encounter (HOSPITAL_COMMUNITY)
Admission: RE | Admit: 2023-06-07 | Discharge: 2023-06-07 | Disposition: A | Discharge status: Home / Self Care | Source: Ambulatory Visit | Attending: Orthopedic Surgery | Admitting: Orthopedic Surgery

## 2023-06-07 ENCOUNTER — Other Ambulatory Visit: Payer: Self-pay

## 2023-06-07 ENCOUNTER — Ambulatory Visit (HOSPITAL_COMMUNITY)
Admission: RE | Admit: 2023-06-07 | Discharge: 2023-06-07 | Disposition: A | Discharge status: Home / Self Care | Source: Ambulatory Visit | Attending: Orthopedic Surgery | Admitting: Orthopedic Surgery

## 2023-06-07 ENCOUNTER — Encounter (HOSPITAL_COMMUNITY): Payer: Self-pay

## 2023-06-07 VITALS — BP 114/65 | HR 76 | Temp 97.9°F | Resp 16 | Ht <= 58 in | Wt 135.0 lb

## 2023-06-07 DIAGNOSIS — R531 Weakness: Secondary | ICD-10-CM | POA: Diagnosis not present

## 2023-06-07 DIAGNOSIS — F32A Depression, unspecified: Secondary | ICD-10-CM | POA: Insufficient documentation

## 2023-06-07 DIAGNOSIS — M19011 Primary osteoarthritis, right shoulder: Secondary | ICD-10-CM | POA: Diagnosis not present

## 2023-06-07 DIAGNOSIS — M25612 Stiffness of left shoulder, not elsewhere classified: Secondary | ICD-10-CM | POA: Diagnosis not present

## 2023-06-07 DIAGNOSIS — M797 Fibromyalgia: Secondary | ICD-10-CM | POA: Insufficient documentation

## 2023-06-07 DIAGNOSIS — Z01818 Encounter for other preprocedural examination: Secondary | ICD-10-CM

## 2023-06-07 DIAGNOSIS — R7303 Prediabetes: Secondary | ICD-10-CM | POA: Insufficient documentation

## 2023-06-07 DIAGNOSIS — Z01812 Encounter for preprocedural laboratory examination: Secondary | ICD-10-CM | POA: Diagnosis not present

## 2023-06-07 DIAGNOSIS — F419 Anxiety disorder, unspecified: Secondary | ICD-10-CM | POA: Insufficient documentation

## 2023-06-07 DIAGNOSIS — M25512 Pain in left shoulder: Secondary | ICD-10-CM | POA: Diagnosis not present

## 2023-06-07 LAB — SURGICAL PCR SCREEN
MRSA, PCR: NEGATIVE
Staphylococcus aureus: NEGATIVE

## 2023-06-08 ENCOUNTER — Encounter (HOSPITAL_COMMUNITY): Payer: Self-pay

## 2023-06-08 NOTE — Anesthesia Preprocedure Evaluation (Addendum)
 Anesthesia Evaluation  Patient identified by MRN, date of birth, ID band Patient awake    Reviewed: Allergy & Precautions, NPO status , Patient's Chart, lab work & pertinent test results  History of Anesthesia Complications Negative for: history of anesthetic complications  Airway Mallampati: II  TM Distance: >3 FB Neck ROM: Full    Dental no notable dental hx. (+) Teeth Intact, Dental Advisory Given   Pulmonary former smoker   Pulmonary exam normal breath sounds clear to auscultation       Cardiovascular (-) hypertension(-) angina (-) Past MI Normal cardiovascular exam Rhythm:Regular Rate:Normal     Neuro/Psych  PSYCHIATRIC DISORDERS Anxiety Depression     Neuromuscular disease    GI/Hepatic   Endo/Other    Renal/GU K+ 4.0     Musculoskeletal  (+) Arthritis ,  Fibromyalgia -  Abdominal   Peds  Hematology Hgb 13.5 Hct 40.6   Anesthesia Other Findings All: Atorvastatin  Reproductive/Obstetrics                             Anesthesia Physical Anesthesia Plan  ASA: 3  Anesthesia Plan: General and Regional   Post-op Pain Management: Regional block* and Minimal or no pain anticipated   Induction:   PONV Risk Score and Plan: 4 or greater and Treatment may vary due to age or medical condition and Ondansetron  Airway Management Planned: Oral ETT  Additional Equipment: None  Intra-op Plan:   Post-operative Plan: Extubation in OR  Informed Consent: I have reviewed the patients History and Physical, chart, labs and discussed the procedure including the risks, benefits and alternatives for the proposed anesthesia with the patient or authorized representative who has indicated his/her understanding and acceptance.     Dental advisory given  Plan Discussed with: CRNA and Surgeon  Anesthesia Plan Comments: (See PAT note from 4/1  GA w R ISB)        Anesthesia Quick  Evaluation

## 2023-06-08 NOTE — Progress Notes (Signed)
 Case: [id number] Date/Time: 06/09/23 0715   Procedure: ARTHROPLASTY, SHOULDER, TOTAL, REVERSE (Right: Shoulder)   Anesthesia type: Choice   Diagnosis: Arthritis of right shoulder region [M19.011]   Pre-op diagnosis: ARTHRITIS OF RIGHT SHOULDER REGION   Location: [facility] ROOM 07 / [facility]   Surgeons: Jones Broom, MD       DISCUSSION: Morgan Reed is a 78 yo female who presents to PAT prior to surgery above. PMH of former smoking, prediabetes, fibromyalgia, anxiety, depression, arthritis.  Patient seen by Cardiology for pre op exam on 06/03/23 for upcoming surgery. She has no cardiac hx. Cleared for surgery by Dr. Wyline Mood:  "PreOp She can conduct  > 4 METS without shortness of breath or chest pressure. She is low cardaic risk for intermediate risk surgery. RCRI < 5 % for major adverse cardiac events she is acceptable cardiac risk for for right reverse total shoulder arthroplasty."  VS: BP 114/65   Pulse 76   Temp 36.6 C (Oral)   Resp 16   Ht 4\' 9"  (1.448 m)   Wt 61.2 kg   SpO2 100%   BMI 29.21 kg/m   PROVIDERS: Carilyn Goodpasture, NP   LABS: Labs reviewed: Acceptable for surgery. (all labs ordered are listed, but only abnormal results are displayed)  Labs Reviewed  SURGICAL PCR SCREEN     IMAGES: CXR 02/14/23:  FINDINGS: Cardiomediastinal silhouette unchanged in size and contour. No evidence of central vascular congestion. No interlobular septal thickening.   No pneumothorax or pleural effusion. Coarsened interstitial markings, with no confluent airspace disease.   No acute displaced fracture. Degenerative changes of the spine.   IMPRESSION: No active cardiopulmonary disease.  EKG 06/03/23:  Sinus rhythm with Premature atrial complexes, rate 63  CV:  Past Medical History:  Diagnosis Date   Anxiety    Arthritis    knees, hands  osteoarthritis   Depression    Fibromyalgia    Insomnia    Lupus anticoagulant positive    undefined after extensive testing since  2022  abnormal coagulation profile   Osteopenia    Pre-diabetes     Past Surgical History:  Procedure Laterality Date   ABDOMINAL HYSTERECTOMY  1995   complication with bowel tear   BREAST BIOPSY     right breast benign approx 1990   BREAST EXCISIONAL BIOPSY Right    right breast benign 1990   BUNIONECTOMY  2000   EYE SURGERY Bilateral 2014   cataracts   REPLACEMENT TOTAL KNEE Right 2013   REVERSE SHOULDER ARTHROPLASTY Left 02/17/2023   Procedure: REVERSE SHOULDER ARTHROPLASTY;  Surgeon: Jones Broom, MD;  Location[facility];  Service: Orthopedics;  Laterality: Left;   TONSILLECTOMY     as a child   TOTAL KNEE REVISION Right 12/01/2020   Procedure: RIGHT TOTAL KNEE REVISION;  Surgeon: Gean Birchwood, MD;  Location[facility];  Service: Orthopedics;  Laterality: Right;    MEDICATIONS:  acetaminophen (TYLENOL) 650 MG CR tablet   aspirin EC 81 MG tablet   buPROPion (WELLBUTRIN XL) 300 MG 24 hr tablet   CALCIUM-VITAMIN D PO   Glucosamine-Chondroitin-MSM (GLUCOSAMINE CHONDROIT MSM DS PO)   hydrOXYzine (ATARAX) 10 MG tablet   loratadine (CLARITIN) 10 MG tablet   Multiple Vitamin (MULTIVITAMIN WITH MINERALS) TABS tablet   Probiotic Product (PROBIOTIC PO)   rosuvastatin (CRESTOR) 20 MG tablet   TART CHERRY PO   tizanidine (ZANAFLEX) 2 MG capsule   topiramate (TOPAMAX) 25 MG tablet   traMADol (ULTRAM) 50 MG tablet   traZODone (  DESYREL) 50 MG tablet   Turmeric (QC TUMERIC COMPLEX) 500 MG CAPS   No current facility-administered medications for this encounter.   Marcille Blanco [facility] Short Stay/Anesthesiology [facility] Phone [phone] 06/08/2023 9:06 AM

## 2023-06-09 ENCOUNTER — Other Ambulatory Visit: Payer: Self-pay

## 2023-06-09 ENCOUNTER — Ambulatory Visit (HOSPITAL_COMMUNITY): Payer: Self-pay | Admitting: Certified Registered"

## 2023-06-09 ENCOUNTER — Encounter (HOSPITAL_COMMUNITY)
Admission: RE | Disposition: A | Discharge status: Home / Self Care | Payer: Self-pay | Source: Home / Self Care | Attending: Orthopedic Surgery

## 2023-06-09 ENCOUNTER — Observation Stay (HOSPITAL_COMMUNITY)
Admission: RE | Admit: 2023-06-09 | Discharge: 2023-06-10 | Disposition: A | Discharge status: Home / Self Care | Attending: Orthopedic Surgery | Admitting: Orthopedic Surgery

## 2023-06-09 ENCOUNTER — Ambulatory Visit (HOSPITAL_COMMUNITY): Payer: Self-pay | Admitting: Medical

## 2023-06-09 ENCOUNTER — Encounter (HOSPITAL_COMMUNITY): Payer: Self-pay | Admitting: Orthopedic Surgery

## 2023-06-09 DIAGNOSIS — Z7982 Long term (current) use of aspirin: Secondary | ICD-10-CM | POA: Diagnosis not present

## 2023-06-09 DIAGNOSIS — Z79899 Other long term (current) drug therapy: Secondary | ICD-10-CM | POA: Insufficient documentation

## 2023-06-09 DIAGNOSIS — F418 Other specified anxiety disorders: Secondary | ICD-10-CM

## 2023-06-09 DIAGNOSIS — Z01818 Encounter for other preprocedural examination: Secondary | ICD-10-CM

## 2023-06-09 DIAGNOSIS — M19011 Primary osteoarthritis, right shoulder: Secondary | ICD-10-CM | POA: Diagnosis not present

## 2023-06-09 DIAGNOSIS — M75101 Unspecified rotator cuff tear or rupture of right shoulder, not specified as traumatic: Secondary | ICD-10-CM | POA: Diagnosis not present

## 2023-06-09 DIAGNOSIS — Z96612 Presence of left artificial shoulder joint: Secondary | ICD-10-CM | POA: Insufficient documentation

## 2023-06-09 DIAGNOSIS — G8918 Other acute postprocedural pain: Secondary | ICD-10-CM | POA: Diagnosis not present

## 2023-06-09 DIAGNOSIS — Z96611 Presence of right artificial shoulder joint: Secondary | ICD-10-CM | POA: Diagnosis not present

## 2023-06-09 DIAGNOSIS — Z87891 Personal history of nicotine dependence: Secondary | ICD-10-CM | POA: Insufficient documentation

## 2023-06-09 DIAGNOSIS — Z96651 Presence of right artificial knee joint: Secondary | ICD-10-CM | POA: Insufficient documentation

## 2023-06-09 HISTORY — PX: REVERSE SHOULDER ARTHROPLASTY: SHX5054

## 2023-06-09 SURGERY — ARTHROPLASTY, SHOULDER, TOTAL, REVERSE
Anesthesia: Regional | Site: Shoulder | Laterality: Right

## 2023-06-09 MED ORDER — TRANEXAMIC ACID-NACL 1000-0.7 MG/100ML-% IV SOLN
1000.0000 mg | INTRAVENOUS | Status: AC
  Administered 2023-06-09: 1000 mg via INTRAVENOUS
  Filled 2023-06-09: qty 100

## 2023-06-09 MED ORDER — DIPHENHYDRAMINE HCL 12.5 MG/5ML PO ELIX: 12.5000 mg | ORAL_SOLUTION | ORAL | Status: DC | PRN

## 2023-06-09 MED ORDER — BISACODYL 5 MG PO TBEC: 5.0000 mg | DELAYED_RELEASE_TABLET | Freq: Every day | ORAL | Status: DC | PRN

## 2023-06-09 MED ORDER — DOCUSATE SODIUM 100 MG PO CAPS
100.0000 mg | ORAL_CAPSULE | Freq: Two times a day (BID) | ORAL | Status: DC
  Administered 2023-06-09 – 2023-06-10 (×2): 100 mg via ORAL
  Filled 2023-06-09 (×2): qty 1

## 2023-06-09 MED ORDER — WATER FOR IRRIGATION, STERILE IR SOLN
Status: DC | PRN
  Administered 2023-06-09: 1000 mL

## 2023-06-09 MED ORDER — ONDANSETRON HCL 4 MG/2ML IJ SOLN
INTRAMUSCULAR | Status: DC | PRN
  Administered 2023-06-09: 4 mg via INTRAVENOUS

## 2023-06-09 MED ORDER — ACETAMINOPHEN 10 MG/ML IV SOLN: 1000.0000 mg | Freq: Once | INTRAVENOUS | Status: DC | PRN

## 2023-06-09 MED ORDER — ROCURONIUM BROMIDE 10 MG/ML (PF) SYRINGE
PREFILLED_SYRINGE | INTRAVENOUS | Status: DC | PRN
  Administered 2023-06-09: 10 mg via INTRAVENOUS
  Administered 2023-06-09: 35 mg via INTRAVENOUS

## 2023-06-09 MED ORDER — POLYETHYLENE GLYCOL 3350 17 G PO PACK: 17.0000 g | PACK | Freq: Every day | ORAL | Status: DC | PRN

## 2023-06-09 MED ORDER — FLEET ENEMA RE ENEM: 1.0000 | ENEMA | Freq: Once | RECTAL | Status: DC | PRN

## 2023-06-09 MED ORDER — TOPIRAMATE 25 MG PO TABS
75.0000 mg | ORAL_TABLET | Freq: Every evening | ORAL | Status: DC
  Administered 2023-06-09: 75 mg via ORAL
  Filled 2023-06-09: qty 3

## 2023-06-09 MED ORDER — MENTHOL 3 MG MT LOZG
1.0000 | LOZENGE | OROMUCOSAL | Status: DC | PRN
  Administered 2023-06-09: 3 mg via ORAL
  Filled 2023-06-09: qty 9

## 2023-06-09 MED ORDER — EPHEDRINE SULFATE (PRESSORS) 50 MG/ML IJ SOLN
INTRAMUSCULAR | Status: DC | PRN
  Administered 2023-06-09 (×2): 5 mg via INTRAVENOUS

## 2023-06-09 MED ORDER — ASPIRIN 81 MG PO TBEC
81.0000 mg | DELAYED_RELEASE_TABLET | Freq: Two times a day (BID) | ORAL | Status: DC
  Administered 2023-06-10: 81 mg via ORAL
  Filled 2023-06-09: qty 1

## 2023-06-09 MED ORDER — PHENYLEPHRINE HCL-NACL 20-0.9 MG/250ML-% IV SOLN
INTRAVENOUS | Status: DC | PRN
  Administered 2023-06-09: 30 ug/min via INTRAVENOUS

## 2023-06-09 MED ORDER — ACETAMINOPHEN 325 MG PO TABS: 325.0000 mg | ORAL_TABLET | Freq: Four times a day (QID) | ORAL | Status: DC | PRN

## 2023-06-09 MED ORDER — METHOCARBAMOL 1000 MG/10ML IJ SOLN: 500.0000 mg | Freq: Four times a day (QID) | INTRAMUSCULAR | Status: DC | PRN

## 2023-06-09 MED ORDER — DEXAMETHASONE SODIUM PHOSPHATE 10 MG/ML IJ SOLN
INTRAMUSCULAR | Status: DC | PRN
  Administered 2023-06-09: 8 mg via INTRAVENOUS

## 2023-06-09 MED ORDER — LIDOCAINE HCL (PF) 2 % IJ SOLN
INTRAMUSCULAR | Status: DC | PRN
  Administered 2023-06-09: 40 mg via INTRADERMAL

## 2023-06-09 MED ORDER — CEFAZOLIN SODIUM-DEXTROSE 2-4 GM/100ML-% IV SOLN
2.0000 g | INTRAVENOUS | Status: AC
  Administered 2023-06-09: 2 g via INTRAVENOUS
  Filled 2023-06-09: qty 100

## 2023-06-09 MED ORDER — FENTANYL CITRATE PF 50 MCG/ML IJ SOSY
25.0000 ug | PREFILLED_SYRINGE | INTRAMUSCULAR | Status: DC | PRN
Start: 2023-06-09 — End: 2023-06-09

## 2023-06-09 MED ORDER — HYDROMORPHONE HCL 1 MG/ML IJ SOLN
0.5000 mg | INTRAMUSCULAR | Status: DC | PRN
  Administered 2023-06-10: 0.5 mg via INTRAVENOUS
  Administered 2023-06-10: 1 mg via INTRAVENOUS
  Filled 2023-06-09 (×2): qty 1

## 2023-06-09 MED ORDER — BUPROPION HCL ER (XL) 300 MG PO TB24
300.0000 mg | ORAL_TABLET | Freq: Every morning | ORAL | Status: DC
  Administered 2023-06-10: 300 mg via ORAL
  Filled 2023-06-09: qty 1

## 2023-06-09 MED ORDER — PHENYLEPHRINE HCL-NACL 20-0.9 MG/250ML-% IV SOLN
INTRAVENOUS | Status: AC
  Filled 2023-06-09: qty 250

## 2023-06-09 MED ORDER — SODIUM CHLORIDE 0.9 % IV SOLN: INTRAVENOUS | Status: AC

## 2023-06-09 MED ORDER — ALUM & MAG HYDROXIDE-SIMETH 200-200-20 MG/5ML PO SUSP: 30.0000 mL | ORAL | Status: DC | PRN

## 2023-06-09 MED ORDER — ROSUVASTATIN CALCIUM 20 MG PO TABS
20.0000 mg | ORAL_TABLET | Freq: Every day | ORAL | Status: DC
  Administered 2023-06-09: 20 mg via ORAL
  Filled 2023-06-09: qty 1

## 2023-06-09 MED ORDER — METHOCARBAMOL 500 MG PO TABS
500.0000 mg | ORAL_TABLET | Freq: Four times a day (QID) | ORAL | Status: DC | PRN
  Administered 2023-06-09: 500 mg via ORAL
  Filled 2023-06-09: qty 1

## 2023-06-09 MED ORDER — CHLORHEXIDINE GLUCONATE 0.12 % MT SOLN
15.0000 mL | Freq: Once | OROMUCOSAL | Status: AC
  Administered 2023-06-09: 15 mL via OROMUCOSAL

## 2023-06-09 MED ORDER — LORATADINE 10 MG PO TABS
10.0000 mg | ORAL_TABLET | Freq: Every day | ORAL | Status: DC
  Administered 2023-06-09: 10 mg via ORAL
  Filled 2023-06-09: qty 1

## 2023-06-09 MED ORDER — OXYCODONE HCL 5 MG PO TABS
5.0000 mg | ORAL_TABLET | ORAL | Status: DC | PRN
  Administered 2023-06-09: 5 mg via ORAL
  Administered 2023-06-10: 10 mg via ORAL
  Administered 2023-06-10: 5 mg via ORAL
  Filled 2023-06-09: qty 1
  Filled 2023-06-09: qty 2
  Filled 2023-06-09: qty 1

## 2023-06-09 MED ORDER — ONDANSETRON HCL 4 MG/2ML IJ SOLN: 4.0000 mg | Freq: Once | INTRAMUSCULAR | Status: DC | PRN

## 2023-06-09 MED ORDER — BUPIVACAINE LIPOSOME 1.3 % IJ SUSP
INTRAMUSCULAR | Status: DC | PRN
  Administered 2023-06-09: 10 mL via PERINEURAL

## 2023-06-09 MED ORDER — SODIUM CHLORIDE 0.9 % IR SOLN
Status: DC | PRN
  Administered 2023-06-09: 1000 mL

## 2023-06-09 MED ORDER — ORAL CARE MOUTH RINSE: 15.0000 mL | Freq: Once | OROMUCOSAL | Status: AC

## 2023-06-09 MED ORDER — PROPOFOL 10 MG/ML IV BOLUS
INTRAVENOUS | Status: AC
  Filled 2023-06-09: qty 20

## 2023-06-09 MED ORDER — FENTANYL CITRATE (PF) 100 MCG/2ML IJ SOLN
INTRAMUSCULAR | Status: DC | PRN
  Administered 2023-06-09 (×2): 50 ug via INTRAVENOUS

## 2023-06-09 MED ORDER — ONDANSETRON HCL 4 MG/2ML IJ SOLN: 4.0000 mg | Freq: Four times a day (QID) | INTRAMUSCULAR | Status: DC | PRN

## 2023-06-09 MED ORDER — SUGAMMADEX SODIUM 200 MG/2ML IV SOLN
INTRAVENOUS | Status: DC | PRN
  Administered 2023-06-09: 200 mg via INTRAVENOUS

## 2023-06-09 MED ORDER — PROPOFOL 10 MG/ML IV BOLUS
INTRAVENOUS | Status: DC | PRN
  Administered 2023-06-09: 120 mg via INTRAVENOUS

## 2023-06-09 MED ORDER — PHENOL 1.4 % MT LIQD: 1.0000 | OROMUCOSAL | Status: DC | PRN

## 2023-06-09 MED ORDER — BUPIVACAINE HCL (PF) 0.5 % IJ SOLN
INTRAMUSCULAR | Status: DC | PRN
  Administered 2023-06-09: 13 mL via PERINEURAL

## 2023-06-09 MED ORDER — ONDANSETRON HCL 4 MG PO TABS: 4.0000 mg | ORAL_TABLET | Freq: Four times a day (QID) | ORAL | Status: DC | PRN

## 2023-06-09 MED ORDER — ACETAMINOPHEN 500 MG PO TABS
1000.0000 mg | ORAL_TABLET | Freq: Four times a day (QID) | ORAL | Status: AC
  Administered 2023-06-09 – 2023-06-10 (×4): 1000 mg via ORAL
  Filled 2023-06-09 (×4): qty 2

## 2023-06-09 MED ORDER — TRAZODONE HCL 50 MG PO TABS
50.0000 mg | ORAL_TABLET | Freq: Every day | ORAL | Status: DC
  Administered 2023-06-09: 50 mg via ORAL
  Filled 2023-06-09: qty 1

## 2023-06-09 MED ORDER — EPHEDRINE 5 MG/ML INJ
INTRAVENOUS | Status: AC
  Filled 2023-06-09: qty 5

## 2023-06-09 MED ORDER — OXYCODONE HCL 5 MG PO TABS: 10.0000 mg | ORAL_TABLET | ORAL | Status: DC | PRN

## 2023-06-09 MED ORDER — LACTATED RINGERS IV SOLN: INTRAVENOUS | Status: DC

## 2023-06-09 MED ORDER — FENTANYL CITRATE (PF) 100 MCG/2ML IJ SOLN
INTRAMUSCULAR | Status: AC
  Filled 2023-06-09: qty 2

## 2023-06-09 MED ORDER — 0.9 % SODIUM CHLORIDE (POUR BTL) OPTIME
TOPICAL | Status: DC | PRN
  Administered 2023-06-09: 1000 mL

## 2023-06-09 SURGICAL SUPPLY — 68 items
BAG COUNTER SPONGE SURGICOUNT (BAG) IMPLANT
BAG ZIPLOCK 12X15 (MISCELLANEOUS) ×1 IMPLANT
BASEPLATE P2 COATD GLND 6.5X30 (Shoulder) IMPLANT
BIT DRILL 1.6MX128 (BIT) IMPLANT
BIT DRILL 2.5 DIA 127 CALI (BIT) IMPLANT
BLADE SAW SGTL 73X25 THK (BLADE) ×1 IMPLANT
BOOTIES KNEE HIGH SLOAN (MISCELLANEOUS) ×2 IMPLANT
COOLER ICEMAN CLASSIC (MISCELLANEOUS) ×1 IMPLANT
COVER BACK TABLE 60X90IN (DRAPES) ×1 IMPLANT
COVER SURGICAL LIGHT HANDLE (MISCELLANEOUS) ×1 IMPLANT
DRAPE INCISE IOBAN 66X45 STRL (DRAPES) ×1 IMPLANT
DRAPE POUCH INSTRU U-SHP 10X18 (DRAPES) ×1 IMPLANT
DRAPE SHEET LG 3/4 BI-LAMINATE (DRAPES) ×1 IMPLANT
DRAPE SURG 17X11 SM STRL (DRAPES) ×1 IMPLANT
DRAPE SURG ORHT 6 SPLT 77X108 (DRAPES) ×2 IMPLANT
DRAPE TOP 10253 STERILE (DRAPES) ×1 IMPLANT
DRAPE U-SHAPE 47X51 STRL (DRAPES) ×1 IMPLANT
DRILL GLEN ALTIVATE 3.5 (DRILL) IMPLANT
DRSG AQUACEL AG ADV 3.5X 6 (GAUZE/BANDAGES/DRESSINGS) ×1 IMPLANT
DURAPREP 26ML APPLICATOR (WOUND CARE) ×2 IMPLANT
ELECT BLADE TIP CTD 4 INCH (ELECTRODE) ×1 IMPLANT
ELECT REM PT RETURN 15FT ADLT (MISCELLANEOUS) ×1 IMPLANT
FACESHIELD WRAPAROUND (MASK) ×1 IMPLANT
FACESHIELD WRAPAROUND OR TEAM (MASK) ×1 IMPLANT
GLENOSPHERE RSA 32 -6 W/SCRW (Shoulder) IMPLANT
GLOVE BIO SURGEON STRL SZ7.5 (GLOVE) ×1 IMPLANT
GLOVE BIOGEL PI IND STRL 6.5 (GLOVE) ×1 IMPLANT
GLOVE BIOGEL PI IND STRL 8 (GLOVE) ×1 IMPLANT
GLOVE SURG SS PI 6.5 STRL IVOR (GLOVE) ×1 IMPLANT
GOWN STRL REUS W/ TWL LRG LVL3 (GOWN DISPOSABLE) ×1 IMPLANT
GOWN STRL REUS W/ TWL XL LVL3 (GOWN DISPOSABLE) ×1 IMPLANT
HOOD PEEL AWAY T7 (MISCELLANEOUS) ×3 IMPLANT
INSERT SMALL SOCKET 32MM NEU (Insert) IMPLANT
KIT BASIN OR (CUSTOM PROCEDURE TRAY) ×1 IMPLANT
KIT TURNOVER KIT A (KITS) ×1 IMPLANT
MANIFOLD NEPTUNE II (INSTRUMENTS) ×1 IMPLANT
NDL TROCAR POINT SZ 2 1/2 (NEEDLE) IMPLANT
NEEDLE TROCAR POINT SZ 2 1/2 (NEEDLE) IMPLANT
NS IRRIG 1000ML POUR BTL (IV SOLUTION) ×1 IMPLANT
P2 COATDE GLNOID BSEPLT 6.5X30 (Shoulder) ×1 IMPLANT
PACK SHOULDER (CUSTOM PROCEDURE TRAY) ×1 IMPLANT
PAD COLD SHLDR WRAP-ON (PAD) ×1 IMPLANT
RESTRAINT HEAD UNIVERSAL NS (MISCELLANEOUS) ×1 IMPLANT
RETRIEVER SUT HEWSON (MISCELLANEOUS) IMPLANT
SCREW BONE RSP LOCK 5X18 (Screw) IMPLANT
SCREW BONE RSP LOCK 5X22 (Screw) IMPLANT
SCREW BONE RSP LOCK 5X26 (Screw) IMPLANT
SCREW BONE RSP LOCKING 18MM LG (Screw) ×1 IMPLANT
SCREW BONE RSP LOCKING 5.0X26 (Screw) ×1 IMPLANT
SCREW BONE RSP LOCKING 5.0X32 (Screw) ×2 IMPLANT
SET HNDPC FAN SPRY TIP SCT (DISPOSABLE) ×1 IMPLANT
SLING ARM FOAM STRAP LRG (SOFTGOODS) IMPLANT
SLING ARM FOAM STRAP MED (SOFTGOODS) IMPLANT
STEM HUMERAL 8X48 SHOULDER (Miscellaneous) IMPLANT
STRIP CLOSURE SKIN 1/2X4 (GAUZE/BANDAGES/DRESSINGS) ×1 IMPLANT
SUCTION TUBE FRAZIER 10FR DISP (SUCTIONS) IMPLANT
SUPPORT WRAP ARM LG (MISCELLANEOUS) ×1 IMPLANT
SUT ETHIBOND 2 V 37 (SUTURE) IMPLANT
SUT FIBERWIRE #2 38 REV NDL BL (SUTURE) ×1 IMPLANT
SUT MNCRL AB 4-0 PS2 18 (SUTURE) ×1 IMPLANT
SUT VIC AB 2-0 CT1 TAPERPNT 27 (SUTURE) ×2 IMPLANT
SUTURE FIBERWR#2 38 REV NDL BL (SUTURE) IMPLANT
TAP SURG THRD DJ 6.5 (ORTHOPEDIC DISPOSABLE SUPPLIES) IMPLANT
TAPE LABRALWHITE 1.5X36 (TAPE) IMPLANT
TAPE SUT LABRALTAP WHT/BLK (SUTURE) IMPLANT
TOWEL OR 17X26 10 PK STRL BLUE (TOWEL DISPOSABLE) ×1 IMPLANT
TUBE SUCTION HIGH CAP CLEAR NV (SUCTIONS) ×1 IMPLANT
WATER STERILE IRR 1000ML POUR (IV SOLUTION) ×1 IMPLANT

## 2023-06-09 NOTE — Plan of Care (Signed)

## 2023-06-09 NOTE — H&P (Signed)
 Morgan Reed is an 78 y.o. female.   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  Pain interferes with sleep and quality of life.   Past Medical History:  Diagnosis Date   Anxiety    Arthritis    knees, hands  osteoarthritis   Depression    Fibromyalgia    Insomnia    Lupus anticoagulant positive    undefined after extensive testing since 2022  abnormal coagulation profile   Osteopenia    Pre-diabetes     Past Surgical History:  Procedure Laterality Date   ABDOMINAL HYSTERECTOMY  1995   complication with bowel tear   BREAST BIOPSY     right breast benign approx 1990   BREAST EXCISIONAL BIOPSY Right    right breast benign 1990   BUNIONECTOMY  2000   EYE SURGERY Bilateral 2014   cataracts   REPLACEMENT TOTAL KNEE Right 2013   REVERSE SHOULDER ARTHROPLASTY Left 02/17/2023   Procedure: REVERSE SHOULDER ARTHROPLASTY;  Surgeon: Jones Broom, MD;  [facility];  Service: Orthopedics;  Laterality: Left;   TONSILLECTOMY     as a child   TOTAL KNEE REVISION Right 12/01/2020   Procedure: RIGHT TOTAL KNEE REVISION;  Surgeon: Gean Birchwood, MD;  [facility];  Service: Orthopedics;  Laterality: Right;    Family History  Problem Relation Age of Onset   Cancer Mother        leukemia   Cancer Brother        liver cancer   Healthy Son    Healthy Daughter    Social History:  reports that she quit smoking about 47 years ago. Her smoking use included cigarettes. She has never been exposed to tobacco smoke. She has never used smokeless tobacco. She reports that she does not currently use alcohol. She reports that she does not currently use drugs.  Allergies:  Allergies  Allergen Reactions   Atorvastatin Itching    Medications Prior to Admission  Medication Sig Dispense Refill   acetaminophen (TYLENOL) 650 MG CR tablet Take 1,300 mg by mouth  every 8 (eight) hours as needed for pain.     aspirin EC 81 MG tablet Take 1 tablet (81 mg total) by mouth 2 (two) times daily. (Patient taking differently: Take 81 mg by mouth daily.) 60 tablet 0   buPROPion (WELLBUTRIN XL) 300 MG 24 hr tablet Take 300 mg by mouth every morning.     hydrOXYzine (ATARAX) 10 MG tablet Take 10 mg by mouth 3 (three) times daily as needed for itching.     loratadine (CLARITIN) 10 MG tablet Take 10 mg by mouth daily.     rosuvastatin (CRESTOR) 20 MG tablet Take 20 mg by mouth daily.     tizanidine (ZANAFLEX) 2 MG capsule Take 2 mg by mouth 3 (three) times daily.     topiramate (TOPAMAX) 25 MG tablet Take 1 tablet (25 mg total) by mouth 2 (two) times daily. (Patient taking differently: Take 75 mg by mouth every evening.) 90 tablet 3   traMADol (ULTRAM) 50 MG tablet Take 50 mg by mouth every 8 (eight) hours as needed for moderate pain (pain score 4-6).     traZODone (DESYREL) 50 MG tablet Take 50 mg by mouth at bedtime.     CALCIUM-VITAMIN D PO Take 1 tablet by mouth daily.     Glucosamine-Chondroitin-MSM (GLUCOSAMINE CHONDROIT MSM DS PO) Take 1 tablet  by mouth in the morning and at bedtime.     Multiple Vitamin (MULTIVITAMIN WITH MINERALS) TABS tablet Take 1 tablet by mouth daily.     Probiotic Product (PROBIOTIC PO) Take 1 capsule by mouth daily.     TART CHERRY PO Take 1 capsule by mouth daily.     Turmeric (QC TUMERIC COMPLEX) 500 MG CAPS Take 500 mg by mouth daily.      Results for orders placed or performed during the hospital encounter of 06/07/23 (from the past 48 hours)  Surgical pcr screen     Status: None   Collection Time: 06/07/23 11:22 AM   Specimen: Nasal Mucosa; Nasal Swab  Result Value Ref Range   MRSA, PCR NEGATIVE NEGATIVE   Staphylococcus aureus NEGATIVE NEGATIVE    Comment: (NOTE) The Xpert SA Assay (FDA approved for NASAL specimens in patients 51 years of age and older), is one component of a comprehensive surveillance program. It is not  intended to diagnose infection nor to guide or monitor treatment. Performed at [facility], [address] [place], [place] [address]    DG Chest 2 View Result Date: 06/08/2023 CLINICAL DATA:  Preop.  Upcoming right shoulder replacement. EXAM: CHEST - 2 VIEW COMPARISON:  Chest radiograph 02/14/2023 FINDINGS: The cardiomediastinal contours are normal. The lungs are clear. Pulmonary vasculature is normal. No consolidation, pleural effusion, or pneumothorax. Reverse left shoulder arthroplasty. Mild thoracic spondylosis and broad-based scoliosis. IMPRESSION: No active cardiopulmonary disease. Electronically Signed   By: Narda Rutherford M.D.   On: 06/08/2023 13:52    Review of Systems  All other systems reviewed and are negative.   Blood pressure 102/65, pulse 60, temperature 97.8 F (36.6 C), temperature source Oral, resp. rate 16, SpO2 96%. Physical Exam HENT:     Head: Atraumatic.  Cardiovascular:     Pulses: Normal pulses.  Musculoskeletal:     Comments: R shoulder pain with limited ROM. NVID  Neurological:     Mental Status: She is alert.      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

## 2023-06-09 NOTE — Discharge Instructions (Signed)
 Discharge Instructions after Reverse Total Shoulder Arthroplasty   A sling has been provided for you. You are to wear this at all times (except for bathing and dressing), until your first post operative visit with Dr. Ave Filter. Please also wear while sleeping at night. While you bath and dress, let the arm/elbow extend straight down to stretch your elbow. Wiggle your fingers and pump your first while your in the sling to prevent hand swelling. Use ice on the shoulder intermittently over the first 48 hours after surgery. Continue to use ice or and ice machine as needed after 48 hours for pain control/swelling.  Pain medicine has been prescribed for you.  Use your medicine liberally over the first 48 hours, and then you can begin to taper your use. You may take Extra Strength Tylenol or Tylenol only in place of the pain pills. DO NOT take ANY nonsteroidal anti-inflammatory pain medications: Advil, Motrin, Ibuprofen, Aleve, Naproxen or Naprosyn.  Take two 81mg  aspirin a day for 2 weeks after surgery, unless you have an aspirin sensitivity/allergy or asthma.  Leave your dressing on until your first follow up visit.  You may shower with the dressing.  Hold your arm as if you still have your sling on while you shower. Simply allow the water to wash over the site and then pat dry. Make sure your axilla (armpit) is completely dry after showering.    Please call [phone] during normal business hours or [phone] after hours for any problems. Including the following:  - excessive redness of the incisions - drainage for more than 4 days - fever of more than 101.5 F  *Please note that pain medications will not be refilled after hours or on weekends.    Dental Antibiotics:  In most cases prophylactic antibiotics for Dental procdeures after total joint surgery are not necessary.  Exceptions are as follows:  1. History of prior total joint infection  2. Severely immunocompromised (Organ  Transplant, cancer chemotherapy, Rheumatoid biologic meds such as Humera)  3. Poorly controlled diabetes (A1C &gt; 8.0, blood glucose over 200)  If you have one of these conditions, contact your surgeon for an antibiotic prescription, prior to your dental procedure.

## 2023-06-09 NOTE — Anesthesia Procedure Notes (Signed)
 Anesthesia Regional Block: Interscalene brachial plexus block   Pre-Anesthetic Checklist: , timeout performed,  Correct Patient, Correct Site, Correct Laterality,  Correct Procedure, Correct Position, site marked,  Risks and benefits discussed,  Surgical consent,  Pre-op evaluation,  At surgeon's request and post-op pain management  Laterality: Upper and Right  Prep: Maximum Sterile Barrier Precautions used, chloraprep       Needles:  Injection technique: Single-shot  Needle Type: Echogenic Needle     Needle Length: 5cm  Needle Gauge: 21     Additional Needles:   Procedures:,,,, ultrasound used (permanent image in chart),,    Narrative:  Start time: 06/09/2023 7:09 AM End time: 06/09/2023 7:13 AM Injection made incrementally with aspirations every 5 mL.  Performed by: Personally  Anesthesiologist: Trevor Iha, MD  Additional Notes: Block assessed prior to procedure. Patient tolerated procedure well.

## 2023-06-09 NOTE — Op Note (Signed)
 Procedure(s): ARTHROPLASTY, SHOULDER, TOTAL, REVERSE Procedure Note  Morgan Reed female 78 y.o. 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 female  With endstage right shoulder arthritis with rotator cuff disease. Pain and dysfunction interfered with quality of life and nonoperative treatment with activity modification, NSAIDS and injections failed.     Surgeon: Glennon Hamilton   Assistants: Fredia Sorrow PA-C Morgan Reed was present and scrubbed throughout the procedure and was essential in positioning, retraction, exposure, and closure)  Anesthesia: General endotracheal anesthesia with preoperative interscalene block given by the attending anesthesiologist    Procedure Detail  ARTHROPLASTY, SHOULDER, TOTAL, REVERSE   Estimated Blood Loss:  200 mL         Drains: none  Blood Given: none          Specimens: none        Complications:  * No complications entered in OR log *         Disposition: PACU - hemodynamically stable.         Condition: stable      OPERATIVE FINDINGS:  A DJO Altivate pressfit reverse total shoulder arthroplasty was placed with a  size 8 stem, a 32-6 glenosphere, and a standard-mm poly insert. The base plate  fixation was excellent.  PROCEDURE: The patient was identified in the preoperative holding area  where I personally marked the operative site after verifying site, side,  and procedure with the patient. An interscalene block given by  the attending anesthesiologist in the holding area and the patient was taken back to the operating room where all extremities were  carefully padded in position after general anesthesia was induced. She  was placed in a beach-chair position and the operative upper extremity was  prepped and  draped in a standard sterile fashion. An approximately 10-  cm incision was made from the tip of the coracoid process to the center  point of the humerus at the level of the axilla. Dissection was carried  down through subcutaneous tissues to the level of the cephalic vein  which was taken laterally with the deltoid. The pectoralis major was  retracted medially. The subdeltoid space was developed and the lateral  edge of the conjoined tendon was identified. The undersurface of  conjoined tendon was palpated and the musculocutaneous nerve was not in  the field. Retractor was placed underneath the conjoined and second  retractor was placed lateral into the deltoid. The circumflex humeral  artery and vessels were identified and clamped and coagulated. The  biceps tendon was absent.  The subscapularis was taken down as a peel with the underlying capsule.  The  joint was then gently externally rotated while the capsule was released  from the humeral neck around to just beyond the 6 o'clock position. At  this point, the joint was dislocated and the humeral head was presented  into the wound. The excessive osteophyte formation was removed with a  large rongeur.  The cutting guide was used to make the appropriate  head cut and the head was saved for potentially bone grafting.  The glenoid was exposed with the arm in an  abducted extended position. The anterior and posterior labrum were  completely excised and the capsule was released circumferentially to  allow for exposure of the glenoid for preparation. The 2.5 mm drill  was  placed using the guide in 5-10 inferior angulation and the tap was then advanced in the same hole. Small and large reamers were then used. The tap was then removed and the Metaglene was then screwed in with excellent purchase.  The peripheral guide was then used to drilled measured and filled peripheral locking screws. The size 32-6 glenosphere was then impacted on the [employer]  taper and the central screw was placed. The humerus was then again exposed and the diaphyseal reamers were used followed by the metaphyseal reamers. The final broach was left in place in the proximal trial was placed. The joint was reduced and with this implant it was felt that soft tissue tensioning was appropriate with excellent stability and excellent range of motion. Therefore, final humeral stem was placed press-fit.  And then the trial polyethylene inserts were tested again and the above implant was felt to be the most appropriate for final insertion. The joint was reduced taken through full range of motion and felt to be stable. Soft tissue tension was appropriate.  The joint was then copiously irrigated with pulse  lavage and the wound was then closed. The subscapularis was repaired loosely with one #2 FiberWire back to the lesser tuberosity.  Skin was closed with 2-0 Vicryl in a deep dermal layer and 4-0  Monocryl for skin closure. Steri-Strips were applied. Sterile  dressings were then applied as well as a sling. The patient was allowed  to awaken from general anesthesia, transferred to stretcher, and taken  to recovery room in stable condition.   POSTOPERATIVE PLAN: The patient will be kept in the hospital overnight for pain control and observation.

## 2023-06-09 NOTE — Anesthesia Postprocedure Evaluation (Signed)
 Anesthesia Post Note  Patient: IONA STAY  Procedure(s) Performed: ARTHROPLASTY, SHOULDER, TOTAL, REVERSE (Right: Shoulder)     Patient location during evaluation: PACU Anesthesia Type: Regional and General Level of consciousness: awake and alert Pain management: pain level controlled Vital Signs Assessment: post-procedure vital signs reviewed and stable Respiratory status: spontaneous breathing, nonlabored ventilation, respiratory function stable and patient connected to nasal cannula oxygen Cardiovascular status: blood pressure returned to baseline and stable Postop Assessment: no apparent nausea or vomiting Anesthetic complications: no  No notable events documented.  Last Vitals:  Vitals:   06/09/23 1019 06/09/23 1338  BP: 136/74 131/65  Pulse: 73 93  Resp: 18 18  Temp: 36.6 C 36.7 C  SpO2: 100% 99%    Last Pain:  Vitals:   06/09/23 1338  TempSrc: Oral  PainSc:                  Trevor Iha

## 2023-06-09 NOTE — Transfer of Care (Signed)
 Immediate Anesthesia Transfer of Care Note  Patient: Morgan Reed  Procedure(s) Performed: ARTHROPLASTY, SHOULDER, TOTAL, REVERSE (Right: Shoulder)  Patient Location: PACU  Anesthesia Type:Regional and GA combined with regional for post-op pain  Level of Consciousness: awake, alert , oriented, and patient cooperative  Airway & Oxygen Therapy: Patient Spontanous Breathing and Patient connected to face mask oxygen  Post-op Assessment: Report given to RN and Post -op Vital signs reviewed and stable  Post vital signs: Reviewed and stable  Last Vitals:  Vitals Value Taken Time  BP 150/70 06/09/23 0853  Temp 36.5 C 06/09/23 0853  Pulse 70 06/09/23 0857  Resp 23 06/09/23 0857  SpO2 96 % 06/09/23 0857  Vitals shown include unfiled device data.  Last Pain:  Vitals:   06/09/23 0853  TempSrc:   PainSc: 0-No pain      Patients Stated Pain Goal: 4 (06/09/23 4098)  Complications: No notable events documented.

## 2023-06-09 NOTE — Anesthesia Procedure Notes (Addendum)
 Procedure Name: Intubation Date/Time: 06/09/2023 7:41 AM  Performed by: Ponciano Ort, CRNAPre-anesthesia Checklist: Patient identified, Emergency Drugs available, Suction available and Patient being monitored Patient Re-evaluated:Patient Re-evaluated prior to induction Oxygen Delivery Method: Circle system utilized Preoxygenation: Pre-oxygenation with 100% oxygen Induction Type: IV induction Ventilation: Mask ventilation without difficulty Laryngoscope Size: Glidescope and 3 (elective for training.) Grade View: Grade I Tube type: Oral Tube size: 7.5 mm Number of attempts: 1 Placement Confirmation: ETT inserted through vocal cords under direct vision, positive ETCO2 and breath sounds checked- equal and bilateral Lisbeth Ply, Paramedic intubated under teaching.) Secured at: 21 cm Tube secured with: Tape Dental Injury: Teeth and Oropharynx as per pre-operative assessment

## 2023-06-10 ENCOUNTER — Encounter (HOSPITAL_COMMUNITY): Payer: Self-pay | Admitting: Orthopedic Surgery

## 2023-06-10 ENCOUNTER — Other Ambulatory Visit (HOSPITAL_COMMUNITY): Payer: Self-pay

## 2023-06-10 DIAGNOSIS — M19011 Primary osteoarthritis, right shoulder: Secondary | ICD-10-CM | POA: Diagnosis not present

## 2023-06-10 DIAGNOSIS — Z96651 Presence of right artificial knee joint: Secondary | ICD-10-CM | POA: Diagnosis not present

## 2023-06-10 DIAGNOSIS — Z7982 Long term (current) use of aspirin: Secondary | ICD-10-CM | POA: Diagnosis not present

## 2023-06-10 DIAGNOSIS — Z96612 Presence of left artificial shoulder joint: Secondary | ICD-10-CM | POA: Diagnosis not present

## 2023-06-10 DIAGNOSIS — Z87891 Personal history of nicotine dependence: Secondary | ICD-10-CM | POA: Diagnosis not present

## 2023-06-10 DIAGNOSIS — M75101 Unspecified rotator cuff tear or rupture of right shoulder, not specified as traumatic: Secondary | ICD-10-CM | POA: Diagnosis not present

## 2023-06-10 LAB — HEMOGLOBIN AND HEMATOCRIT, BLOOD
HCT: 35 % — ABNORMAL LOW (ref 36.0–46.0)
Hemoglobin: 11.1 g/dL — ABNORMAL LOW (ref 12.0–15.0)

## 2023-06-10 MED ORDER — SODIUM CHLORIDE 0.9 % IV BOLUS
500.0000 mL | Freq: Once | INTRAVENOUS | Status: AC
  Administered 2023-06-10: 500 mL via INTRAVENOUS

## 2023-06-10 MED ORDER — OXYCODONE HCL 5 MG PO TABS
5.0000 mg | ORAL_TABLET | ORAL | 0 refills | Status: DC | PRN
  Filled 2023-06-10: qty 30, 3d supply, fill #0

## 2023-06-10 MED ORDER — TIZANIDINE HCL 2 MG PO TABS
2.0000 mg | ORAL_TABLET | Freq: Three times a day (TID) | ORAL | 0 refills | Status: DC | PRN
  Filled 2023-06-10: qty 30, 10d supply, fill #0

## 2023-06-10 NOTE — Evaluation (Signed)
 Occupational Therapy Evaluation Patient Details Name: Morgan Reed MRN: [id number] DOB: 1945-04-20 Today's Date: 06/10/2023   History of Present Illness   Ms. Howell is a 78 yr old female who is s/p a R reverse shoulder arthroplasty on 06-09-23,  due to R shoulder end-stage OA.     Clinical Impressions Pt is s/p shoulder replacement of right dominant upper extremity on 06-09-23.  Therapist provided education and instruction to patient and her friend with regards to ROM/exercises, post-op precautions, UE and sling positioning, donning upper extremity clothing, recommendations for bathing while maintaining shoulder precautions, use of ice for pain and edema management, use of ice machine, NWB status, and donning/doffing sling. Patient and her friend verbalized and demonstrated understanding as needed. Patient needed assistance to donn gown and shoes, with instruction provided on compensatory strategies to perform ADLs. OT will continue to follow the pt for further services in the acute care setting, to reinforce the aforementioned education provided and to ensure adequate carryover. Patient to follow up with MD for further therapy needs.       If plan is discharge home, recommend the following:   Assistance with cooking/housework;Assist for transportation;A little help with bathing/dressing/bathroom     Functional Status Assessment   Patient has had a recent decline in their functional status and demonstrates the ability to make significant improvements in function in a reasonable and predictable amount of time.     Equipment Recommendations   Tub/shower seat     Recommendations for Other Services         Precautions/Restrictions   Precautions Precautions: Shoulder Type of Shoulder Precautions: Sling to be worn at all times except ADLs/exercise, no ROM of shoulder, okay to perform elbow, wrist, and hand ROM Shoulder Interventions: Shoulder sling/immobilizer Precaution Booklet  Issued: Yes (comment) Required Braces or Orthoses: Sling Restrictions Weight Bearing Restrictions Per Provider Order: Yes RUE Weight Bearing Per Provider Order: Non weight bearing     Mobility Bed Mobility Overal bed mobility: Needs Assistance Bed Mobility: Supine to Sit     Supine to sit: Contact guard, HOB elevated          Transfers Overall transfer level: Needs assistance Equipment used:  (N/A) Transfers: Sit to/from Stand Sit to Stand: Supervision                  Balance Overall balance assessment: No apparent balance deficits (not formally assessed)           ADL either performed or assessed with clinical judgement      Pertinent Vitals/Pain Pain Assessment Pain Assessment: 0-10 Pain Score: 7  Pain Location: R shoulder Pain Intervention(s): Monitored during session, Ice applied     Extremity/Trunk Assessment Upper Extremity Assessment Upper Extremity Assessment: Right hand dominant           Communication Communication Communication: No apparent difficulties   Cognition Arousal: Alert Behavior During Therapy: WFL for tasks assessed/performed Cognition: No apparent impairments             OT - Cognition Comments: Oriented x4, able to follow commands without difficulty, friendly        Following commands: Intact             Shoulder Instructions Shoulder Instructions Donning/doffing shirt without moving shoulder: Minimal assistance Method for sponge bathing under operated UE: Patient able to independently direct caregiver Donning/doffing sling/immobilizer: Moderate assistance Correct positioning of sling/immobilizer: Minimal assistance Pendulum exercises (written home exercise program):  (N/A) ROM for elbow, wrist and digits  of operated UE: Patient able to independently direct caregiver Sling wearing schedule (on at all times/off for ADL's): Patient able to independently direct caregiver Proper positioning of operated UE  when showering: Patient able to independently direct caregiver Dressing change: Patient able to independently direct caregiver Positioning of UE while sleeping: Patient able to independently direct caregiver    Home Living Family/patient expects to be discharged to:: Private residence Living Arrangements: Alone Available Help at Discharge: Friend(s) Type of Home: Apartment Home Access: Level entry     Home Layout: One level     Bathroom Shower/Tub: Runner, broadcasting/film/video: Rollator (4 wheels);Cane - single point;Grab bars - tub/shower          Prior Functioning/Environment Prior Level of Function : Independent/Modified Independent;Driving             Mobility Comments: She was independent with ambulation. ADLs Comments: She was modified independent to independent with ADLs and household IADLs. She drives.    OT Problem List: Impaired UE functional use;Decreased range of motion;Pain   OT Treatment/Interventions: Self-care/ADL training;Therapeutic exercise;DME and/or AE instruction;Patient/family education;Therapeutic activities      OT Goals(Current goals can be found in the care plan section)   Acute Rehab OT Goals Patient Stated Goal: for shoulder to heal and to return to normal activities OT Goal Formulation: With patient Time For Goal Achievement: 06/24/23 Potential to Achieve Goals: Good ADL Goals Pt Will Perform Upper Body Dressing: with supervision;with set-up;sitting Pt Will Transfer to Toilet: with supervision;ambulating Pt/caregiver will Perform Home Exercise Program: Right Upper extremity;With written HEP provided;With Supervision   OT Frequency:  Min 2X/week       AM-PAC OT "6 Clicks" Daily Activity     Outcome Measure Help from another person eating meals?: None Help from another person taking care of personal grooming?: None Help from another person toileting, which includes using toliet, bedpan, or urinal?: A Little Help from  another person bathing (including washing, rinsing, drying)?: A Little Help from another person to put on and taking off regular upper body clothing?: A Little Help from another person to put on and taking off regular lower body clothing?: A Little 6 Click Score: 20   End of Session Equipment Utilized During Treatment: Other (comment) (N/A) Nurse Communication: Other (comment) (Shoulder education completed)  Activity Tolerance: Patient tolerated treatment well Patient left: with family/visitor present;with call Neal/phone within reach  OT Visit Diagnosis: Muscle weakness (generalized) (M62.81)                Time: 0454-0981 OT Time Calculation (min): 27 min Charges:  OT General Charges $OT Visit: 1 Visit OT Evaluation $OT Eval Moderate Complexity: 1 Mod OT Treatments $Self Care/Home Management : 8-22 mins    Reuben Likes, OTR/L 06/10/2023, 11:15 AM

## 2023-06-10 NOTE — Progress Notes (Signed)
 Discharge medications delivered to bedside [facility]

## 2023-06-10 NOTE — Care Management Obs Status (Signed)
 MEDICARE OBSERVATION STATUS NOTIFICATION   Patient Details  Name: Morgan Reed MRN: [id number] Date of Birth: Jul 15, 1945   Medicare Observation Status Notification Given:  Yes    Amada Jupiter, LCSW 06/10/2023, 10:21 AM

## 2023-06-10 NOTE — Progress Notes (Signed)
 PATIENT ID: Morgan Reed  MRN: [id number]  DOB/AGE:  Jul 21, 1945 / 78 y.o.  1 Day Post-Op Procedure(s) (LRB): ARTHROPLASTY, SHOULDER, TOTAL, REVERSE (Right)  Subjective: Patient is feeling "ok". Denies lightheadedness and dizziness. BP has been somewhat low overnight. Voiding well. Positive flatus. Has some pain in the right shoulder as nerve block wore off around 11pm.  Objective: Vital signs in last 24 hours: Temp:  [97.6 F (36.4 C)-98.2 F (36.8 C)] 97.6 F (36.4 C) (04/04 0721) Pulse Rate:  [65-98] 72 (04/04 0721) Resp:  [14-20] 18 (04/04 0721) BP: (77-150)/(47-74) 91/53 (04/04 0721) SpO2:  [95 %-100 %] 99 % (04/04 0721)  Intake/Output from previous day: 04/03 0701 - 04/04 0700 In: 2472.7 [P.O.:720; I.V.:1552.7; IV Piggyback:200] Out: 601 [Urine:501; Blood:100]   Recent Labs    06/10/23 0317  HGB 11.1*   Recent Labs    06/10/23 0317  HCT 35.0*    Physical Exam: Neurologically intact Sensation intact distally Intact pulses distally Incision: dressing C/D/I No cellulitis present Able to move hand, wrist, elbow RUE Sling in place RUE  Assessment/Plan: 1 Day Post-Op Procedure(s) (LRB): ARTHROPLASTY, SHOULDER, TOTAL, REVERSE (Right)   Advance diet Up with therapy Non Weight Bearing (NWB) RUE VTE prophylaxis:  aspirin 81mg  BID  Work with OT today. Will give bolus to help with hypotension. Plan for DC home today if BP responds. Discharge instructions given. Follow up in office in 2 weeks.    Carols Clemence L. [place], PA-C 06/10/2023, 7:56 AM

## 2023-06-14 NOTE — Discharge Summary (Signed)
 Patient ID: Morgan Reed MRN: [id number] DOB/AGE: [birth year] 78 y.o.  Admit date: 06/09/2023 Discharge date: 06/10/2023  Admission Diagnoses:  Principal Problem:   S/P reverse total shoulder arthroplasty, right   Discharge Diagnoses:  Same  Past Medical History:  Diagnosis Date   Anxiety    Arthritis    knees, hands  osteoarthritis   Depression    Fibromyalgia    Insomnia    Lupus anticoagulant positive    undefined after extensive testing since 2022  abnormal coagulation profile   Osteopenia    Pre-diabetes     Surgeries: Procedure(s): ARTHROPLASTY, SHOULDER, TOTAL, REVERSE on 06/09/2023   Consultants:   Discharged Condition: Improved  Hospital Course: KADIAN BARCELLOS is an 78 y.o. female who was admitted 06/09/2023 for operative treatment ofS/P reverse total shoulder arthroplasty, right. Patient has severe unremitting pain that affects sleep, daily activities, and work/hobbies. After pre-op clearance the patient was taken to the operating room on 06/09/2023 and underwent  Procedure(s): ARTHROPLASTY, SHOULDER, TOTAL, REVERSE.    Patient was given perioperative antibiotics:  Anti-infectives (From admission, onward)    Start     Dose/Rate Route Frequency Ordered Stop   06/09/23 0615  ceFAZolin (ANCEF) IVPB 2g/100 mL premix        2 g 200 mL/hr over 30 Minutes Intravenous On call to O.R. 06/09/23 [id number] 06/09/23 0813        Patient was given sequential compression devices, early ambulation, and chemoprophylaxis to prevent DVT.  Patient benefited maximally from hospital stay and there were no complications. She had some asymptomatic hypotension POD 1 but responded well to fluid bolus.     Discharge Medications:   Allergies as of 06/10/2023       Reactions   Atorvastatin Itching        Medication List     STOP taking these medications    tizanidine 2 MG capsule Commonly known as: ZANAFLEX Replaced by: tiZANidine 2 MG tablet   traMADol 50 MG tablet Commonly known  as: ULTRAM       TAKE these medications    acetaminophen 650 MG CR tablet Commonly known as: TYLENOL Take 1,300 mg by mouth every 8 (eight) hours as needed for pain.   aspirin EC 81 MG tablet Take 1 tablet (81 mg total) by mouth 2 (two) times daily. What changed: when to take this   buPROPion 300 MG 24 hr tablet Commonly known as: WELLBUTRIN XL Take 300 mg by mouth every morning.   CALCIUM-VITAMIN D PO Take 1 tablet by mouth daily.   GLUCOSAMINE CHONDROIT MSM DS PO Take 1 tablet by mouth in the morning and at bedtime.   hydrOXYzine 10 MG tablet Commonly known as: ATARAX Take 10 mg by mouth 3 (three) times daily as needed for itching.   loratadine 10 MG tablet Commonly known as: CLARITIN Take 10 mg by mouth daily.   multivitamin with minerals Tabs tablet Take 1 tablet by mouth daily.   oxyCODONE 5 MG immediate release tablet Commonly known as: Oxy IR/ROXICODONE Take 1-2 tablets (5-10 mg total) by mouth every 4 (four) hours as needed for moderate pain (pain score 4-6) (pain score 4-6).   PROBIOTIC PO Take 1 capsule by mouth daily.   QC Tumeric Complex 500 MG Caps Generic drug: Turmeric Take 500 mg by mouth daily.   rosuvastatin 20 MG tablet Commonly known as: CRESTOR Take 20 mg by mouth daily.   TART CHERRY PO Take 1 capsule by mouth daily.   tiZANidine 2  MG tablet Commonly known as: ZANAFLEX Take 1 tablet (2 mg total) by mouth every 8 (eight) hours as needed for muscle spasms. Replaces: tizanidine 2 MG capsule   topiramate 25 MG tablet Commonly known as: Topamax Take 1 tablet (25 mg total) by mouth 2 (two) times daily. What changed:  how much to take when to take this   traZODone 50 MG tablet Commonly known as: DESYREL Take 50 mg by mouth at bedtime.        Diagnostic Studies: DG Chest 2 View Result Date: 06/08/2023 CLINICAL DATA:  Preop.  Upcoming right shoulder replacement. EXAM: CHEST - 2 VIEW COMPARISON:  Chest radiograph 02/14/2023  FINDINGS: The cardiomediastinal contours are normal. The lungs are clear. Pulmonary vasculature is normal. No consolidation, pleural effusion, or pneumothorax. Reverse left shoulder arthroplasty. Mild thoracic spondylosis and broad-based scoliosis. IMPRESSION: No active cardiopulmonary disease. Electronically Signed   By: Narda Rutherford M.D.   On: 06/08/2023 13:52    Disposition: Discharge disposition: 01-Home or Self Care          Follow-up Information     Jones Broom, MD. Go on 06/27/2023.   Specialty: Orthopedic Surgery Why: Your appointment is scheduled for 9:45 Contact information: [address] [place] [address] [phone]         [facility], [place]. Go on 06/27/2023.   Why: Your appointment is scheduled. they will call you with a time Contact information: Physical Therapy [address] [place] [address] [phone]                  Signed: Joice Lofts L. [place], PA-C 06/14/2023, 11:40 AM

## 2023-06-16 DIAGNOSIS — Z96611 Presence of right artificial shoulder joint: Secondary | ICD-10-CM | POA: Diagnosis not present

## 2023-06-16 DIAGNOSIS — M1990 Unspecified osteoarthritis, unspecified site: Secondary | ICD-10-CM | POA: Diagnosis not present

## 2023-06-21 ENCOUNTER — Ambulatory Visit: Payer: Medicare Other | Admitting: Physical Medicine and Rehabilitation

## 2023-06-27 DIAGNOSIS — M19011 Primary osteoarthritis, right shoulder: Secondary | ICD-10-CM | POA: Diagnosis not present

## 2023-06-27 DIAGNOSIS — M25611 Stiffness of right shoulder, not elsewhere classified: Secondary | ICD-10-CM | POA: Diagnosis not present

## 2023-06-27 DIAGNOSIS — R531 Weakness: Secondary | ICD-10-CM | POA: Diagnosis not present

## 2023-06-27 DIAGNOSIS — M25511 Pain in right shoulder: Secondary | ICD-10-CM | POA: Diagnosis not present

## 2023-06-30 DIAGNOSIS — M25511 Pain in right shoulder: Secondary | ICD-10-CM | POA: Diagnosis not present

## 2023-06-30 DIAGNOSIS — M25611 Stiffness of right shoulder, not elsewhere classified: Secondary | ICD-10-CM | POA: Diagnosis not present

## 2023-06-30 DIAGNOSIS — M1712 Unilateral primary osteoarthritis, left knee: Secondary | ICD-10-CM | POA: Diagnosis not present

## 2023-06-30 DIAGNOSIS — R531 Weakness: Secondary | ICD-10-CM | POA: Diagnosis not present

## 2023-07-04 DIAGNOSIS — R531 Weakness: Secondary | ICD-10-CM | POA: Diagnosis not present

## 2023-07-04 DIAGNOSIS — M25611 Stiffness of right shoulder, not elsewhere classified: Secondary | ICD-10-CM | POA: Diagnosis not present

## 2023-07-04 DIAGNOSIS — M25511 Pain in right shoulder: Secondary | ICD-10-CM | POA: Diagnosis not present

## 2023-07-07 DIAGNOSIS — R531 Weakness: Secondary | ICD-10-CM | POA: Diagnosis not present

## 2023-07-07 DIAGNOSIS — M25611 Stiffness of right shoulder, not elsewhere classified: Secondary | ICD-10-CM | POA: Diagnosis not present

## 2023-07-07 DIAGNOSIS — M25511 Pain in right shoulder: Secondary | ICD-10-CM | POA: Diagnosis not present

## 2023-07-11 DIAGNOSIS — R531 Weakness: Secondary | ICD-10-CM | POA: Diagnosis not present

## 2023-07-11 DIAGNOSIS — M25511 Pain in right shoulder: Secondary | ICD-10-CM | POA: Diagnosis not present

## 2023-07-11 DIAGNOSIS — M25611 Stiffness of right shoulder, not elsewhere classified: Secondary | ICD-10-CM | POA: Diagnosis not present

## 2023-07-13 DIAGNOSIS — M25511 Pain in right shoulder: Secondary | ICD-10-CM | POA: Diagnosis not present

## 2023-07-13 DIAGNOSIS — M25611 Stiffness of right shoulder, not elsewhere classified: Secondary | ICD-10-CM | POA: Diagnosis not present

## 2023-07-13 DIAGNOSIS — R531 Weakness: Secondary | ICD-10-CM | POA: Diagnosis not present

## 2023-07-18 DIAGNOSIS — M25511 Pain in right shoulder: Secondary | ICD-10-CM | POA: Diagnosis not present

## 2023-07-18 DIAGNOSIS — R531 Weakness: Secondary | ICD-10-CM | POA: Diagnosis not present

## 2023-07-18 DIAGNOSIS — M25611 Stiffness of right shoulder, not elsewhere classified: Secondary | ICD-10-CM | POA: Diagnosis not present

## 2023-07-20 DIAGNOSIS — M25511 Pain in right shoulder: Secondary | ICD-10-CM | POA: Diagnosis not present

## 2023-07-20 DIAGNOSIS — R531 Weakness: Secondary | ICD-10-CM | POA: Diagnosis not present

## 2023-07-20 DIAGNOSIS — M25611 Stiffness of right shoulder, not elsewhere classified: Secondary | ICD-10-CM | POA: Diagnosis not present

## 2023-07-26 DIAGNOSIS — M25611 Stiffness of right shoulder, not elsewhere classified: Secondary | ICD-10-CM | POA: Diagnosis not present

## 2023-07-26 DIAGNOSIS — M25511 Pain in right shoulder: Secondary | ICD-10-CM | POA: Diagnosis not present

## 2023-07-26 DIAGNOSIS — R531 Weakness: Secondary | ICD-10-CM | POA: Diagnosis not present

## 2023-07-28 DIAGNOSIS — M25611 Stiffness of right shoulder, not elsewhere classified: Secondary | ICD-10-CM | POA: Diagnosis not present

## 2023-07-28 DIAGNOSIS — R531 Weakness: Secondary | ICD-10-CM | POA: Diagnosis not present

## 2023-07-28 DIAGNOSIS — M25511 Pain in right shoulder: Secondary | ICD-10-CM | POA: Diagnosis not present

## 2023-08-02 DIAGNOSIS — M25611 Stiffness of right shoulder, not elsewhere classified: Secondary | ICD-10-CM | POA: Diagnosis not present

## 2023-08-02 DIAGNOSIS — R531 Weakness: Secondary | ICD-10-CM | POA: Diagnosis not present

## 2023-08-02 DIAGNOSIS — M25511 Pain in right shoulder: Secondary | ICD-10-CM | POA: Diagnosis not present

## 2023-08-04 DIAGNOSIS — M25511 Pain in right shoulder: Secondary | ICD-10-CM | POA: Diagnosis not present

## 2023-08-04 DIAGNOSIS — M25611 Stiffness of right shoulder, not elsewhere classified: Secondary | ICD-10-CM | POA: Diagnosis not present

## 2023-08-04 DIAGNOSIS — R531 Weakness: Secondary | ICD-10-CM | POA: Diagnosis not present

## 2023-08-09 DIAGNOSIS — R531 Weakness: Secondary | ICD-10-CM | POA: Diagnosis not present

## 2023-08-09 DIAGNOSIS — M25611 Stiffness of right shoulder, not elsewhere classified: Secondary | ICD-10-CM | POA: Diagnosis not present

## 2023-08-09 DIAGNOSIS — M25511 Pain in right shoulder: Secondary | ICD-10-CM | POA: Diagnosis not present

## 2023-08-11 DIAGNOSIS — M25611 Stiffness of right shoulder, not elsewhere classified: Secondary | ICD-10-CM | POA: Diagnosis not present

## 2023-08-11 DIAGNOSIS — M25511 Pain in right shoulder: Secondary | ICD-10-CM | POA: Diagnosis not present

## 2023-08-11 DIAGNOSIS — R531 Weakness: Secondary | ICD-10-CM | POA: Diagnosis not present

## 2023-08-16 DIAGNOSIS — R531 Weakness: Secondary | ICD-10-CM | POA: Diagnosis not present

## 2023-08-16 DIAGNOSIS — M25511 Pain in right shoulder: Secondary | ICD-10-CM | POA: Diagnosis not present

## 2023-08-16 DIAGNOSIS — M25611 Stiffness of right shoulder, not elsewhere classified: Secondary | ICD-10-CM | POA: Diagnosis not present

## 2023-08-19 DIAGNOSIS — M25611 Stiffness of right shoulder, not elsewhere classified: Secondary | ICD-10-CM | POA: Diagnosis not present

## 2023-08-19 DIAGNOSIS — M25511 Pain in right shoulder: Secondary | ICD-10-CM | POA: Diagnosis not present

## 2023-08-19 DIAGNOSIS — R531 Weakness: Secondary | ICD-10-CM | POA: Diagnosis not present

## 2023-08-22 DIAGNOSIS — R531 Weakness: Secondary | ICD-10-CM | POA: Diagnosis not present

## 2023-08-22 DIAGNOSIS — M25511 Pain in right shoulder: Secondary | ICD-10-CM | POA: Diagnosis not present

## 2023-08-22 DIAGNOSIS — M25611 Stiffness of right shoulder, not elsewhere classified: Secondary | ICD-10-CM | POA: Diagnosis not present

## 2023-08-26 DIAGNOSIS — R531 Weakness: Secondary | ICD-10-CM | POA: Diagnosis not present

## 2023-08-26 DIAGNOSIS — M25611 Stiffness of right shoulder, not elsewhere classified: Secondary | ICD-10-CM | POA: Diagnosis not present

## 2023-08-29 DIAGNOSIS — M25511 Pain in right shoulder: Secondary | ICD-10-CM | POA: Diagnosis not present

## 2023-08-29 DIAGNOSIS — R531 Weakness: Secondary | ICD-10-CM | POA: Diagnosis not present

## 2023-08-29 DIAGNOSIS — M25611 Stiffness of right shoulder, not elsewhere classified: Secondary | ICD-10-CM | POA: Diagnosis not present

## 2023-09-01 DIAGNOSIS — M25611 Stiffness of right shoulder, not elsewhere classified: Secondary | ICD-10-CM | POA: Diagnosis not present

## 2023-09-01 DIAGNOSIS — M25511 Pain in right shoulder: Secondary | ICD-10-CM | POA: Diagnosis not present

## 2023-09-01 DIAGNOSIS — R531 Weakness: Secondary | ICD-10-CM | POA: Diagnosis not present

## 2023-09-05 DIAGNOSIS — H04123 Dry eye syndrome of bilateral lacrimal glands: Secondary | ICD-10-CM | POA: Diagnosis not present

## 2023-09-05 DIAGNOSIS — M25611 Stiffness of right shoulder, not elsewhere classified: Secondary | ICD-10-CM | POA: Diagnosis not present

## 2023-09-05 DIAGNOSIS — Z961 Presence of intraocular lens: Secondary | ICD-10-CM | POA: Diagnosis not present

## 2023-09-05 DIAGNOSIS — R531 Weakness: Secondary | ICD-10-CM | POA: Diagnosis not present

## 2023-09-05 DIAGNOSIS — H52203 Unspecified astigmatism, bilateral: Secondary | ICD-10-CM | POA: Diagnosis not present

## 2023-09-05 DIAGNOSIS — M25511 Pain in right shoulder: Secondary | ICD-10-CM | POA: Diagnosis not present

## 2023-09-07 DIAGNOSIS — M25511 Pain in right shoulder: Secondary | ICD-10-CM | POA: Diagnosis not present

## 2023-09-07 DIAGNOSIS — M25611 Stiffness of right shoulder, not elsewhere classified: Secondary | ICD-10-CM | POA: Diagnosis not present

## 2023-09-07 DIAGNOSIS — R531 Weakness: Secondary | ICD-10-CM | POA: Diagnosis not present

## 2023-09-12 DIAGNOSIS — M25611 Stiffness of right shoulder, not elsewhere classified: Secondary | ICD-10-CM | POA: Diagnosis not present

## 2023-09-12 DIAGNOSIS — Z96611 Presence of right artificial shoulder joint: Secondary | ICD-10-CM | POA: Diagnosis not present

## 2023-09-12 DIAGNOSIS — M6281 Muscle weakness (generalized): Secondary | ICD-10-CM | POA: Diagnosis not present

## 2023-09-12 DIAGNOSIS — M25511 Pain in right shoulder: Secondary | ICD-10-CM | POA: Diagnosis not present

## 2023-09-12 DIAGNOSIS — R531 Weakness: Secondary | ICD-10-CM | POA: Diagnosis not present

## 2023-09-15 DIAGNOSIS — R531 Weakness: Secondary | ICD-10-CM | POA: Diagnosis not present

## 2023-09-15 DIAGNOSIS — M25611 Stiffness of right shoulder, not elsewhere classified: Secondary | ICD-10-CM | POA: Diagnosis not present

## 2023-09-15 DIAGNOSIS — M25511 Pain in right shoulder: Secondary | ICD-10-CM | POA: Diagnosis not present

## 2023-09-19 ENCOUNTER — Encounter: Attending: Physical Medicine and Rehabilitation | Admitting: Physical Medicine and Rehabilitation

## 2023-09-19 ENCOUNTER — Encounter: Payer: Self-pay | Admitting: Physical Medicine and Rehabilitation

## 2023-09-19 VITALS — BP 115/67 | HR 71 | Ht <= 58 in | Wt 138.0 lb

## 2023-09-19 DIAGNOSIS — M797 Fibromyalgia: Secondary | ICD-10-CM | POA: Diagnosis not present

## 2023-09-19 DIAGNOSIS — M25512 Pain in left shoulder: Secondary | ICD-10-CM | POA: Insufficient documentation

## 2023-09-19 DIAGNOSIS — M25511 Pain in right shoulder: Secondary | ICD-10-CM | POA: Diagnosis not present

## 2023-09-19 DIAGNOSIS — R52 Pain, unspecified: Secondary | ICD-10-CM | POA: Insufficient documentation

## 2023-09-19 DIAGNOSIS — G8929 Other chronic pain: Secondary | ICD-10-CM | POA: Diagnosis not present

## 2023-09-19 DIAGNOSIS — R5383 Other fatigue: Secondary | ICD-10-CM | POA: Diagnosis not present

## 2023-09-19 DIAGNOSIS — M25611 Stiffness of right shoulder, not elsewhere classified: Secondary | ICD-10-CM | POA: Diagnosis not present

## 2023-09-19 DIAGNOSIS — R531 Weakness: Secondary | ICD-10-CM | POA: Diagnosis not present

## 2023-09-19 NOTE — Progress Notes (Signed)
 Subjective:    Patient ID: Morgan Reed, female    DOB: May 04, 1945, 78 y.o.   MRN: [id number]  HPI [name] is a 78 year old woman who presents with diffuse pain.  1) Diffuse pain -stopped 25mg  pain topamax  and is having a hard time sleeping -has been present for a couple of years -her ANA was positive -she gets  a rash on her face and was told it is not rosacea -she was checked for lupus -she was never she told she has fibromyalgia -she takes 25mg  topamax  for weight loss but it has not helped her pain- she takes this during the day, she would be willing to try an increased dose  2) 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   3) Fatigue: -she takes 25mg  daily topamax  -she saw her vitamin D  level was normal  4) Left shoulder pain: -improved since surgery -weather worsens the pain  5) Right shoulder pain: -hurting because it is working overtime and needs the same surgery  6) Insomnia: -worsened since sh stopped topamax   Pain Inventory Average Pain 6 Pain Right Now 6 My pain is constant and aching  In the last 24 hours, has pain interfered with the following? General activity 5 Relation with others 5 Enjoyment of life 5 What TIME of day is your pain at its worst? morning , daytime, evening, and night Sleep (in general) Fair  Pain is worse with: walking, bending, and some activites Pain improves with: heat/ice, therapy/exercise, medication, and injections Relief from Meds: 5   Family History  Problem Relation Age of Onset   Cancer Mother        leukemia   Cancer Brother        liver cancer   Healthy Son    Healthy Daughter    Social History   Socioeconomic History   Marital status: Widowed    Spouse name: Not on file   Number of children: Not on file   Years of education: Not on file   Highest education level: Not  on file  Occupational History   Not on file  Tobacco Use   Smoking status: Former    Current packs/day: 0.00    Types: Cigarettes    Quit date: 45    Years since quitting: 47.5    Passive exposure: Never   Smokeless tobacco: Never  Vaping Use   Vaping status: Never Used  Substance and Sexual Activity   Alcohol  use: Not Currently    Comment: rare   none in a long time   Drug use: Not Currently   Sexual activity: Not Currently  Other Topics Concern   Not on file  Social History Narrative   Not on file   Social Drivers of Health   Financial Resource Strain: Not on file  Food Insecurity: No Food Insecurity (06/09/2023)   Hunger Vital Sign    Worried About Running Out of Food in the Last Year: Never true    Ran Out of Food in the Last Year: Never true  Transportation Needs: No Transportation Needs (06/09/2023)   PRAPARE - Administrator, Civil Service (Medical): No    Lack of Transportation (Non-Medical): No  Physical Activity: Not on file  Stress: Not on file  Social Connections: Moderately Integrated (06/09/2023)   Social Connection and Isolation  Panel    Frequency of Communication with Friends and Family: More than three times a week    Frequency of Social Gatherings with Friends and Family: More than three times a week    Attends Religious Services: More than 4 times per year    Active Member of [employer] or Organizations: Yes    Attends Banker Meetings: More than 4 times per year    Marital Status: Widowed   Past Surgical History:  Procedure Laterality Date   ABDOMINAL HYSTERECTOMY  1995   complication with bowel tear   BREAST BIOPSY     right breast benign approx 1990   BREAST EXCISIONAL BIOPSY Right    right breast benign 1990   BUNIONECTOMY  2000   EYE SURGERY Bilateral 2014   cataracts   REPLACEMENT TOTAL KNEE Right 2013   REVERSE SHOULDER ARTHROPLASTY Left 02/17/2023   Procedure: REVERSE SHOULDER ARTHROPLASTY;  Surgeon: Dozier Soulier,  MD;  Location: [facility];  Service: Orthopedics;  Laterality: Left;   REVERSE SHOULDER ARTHROPLASTY Right 06/09/2023   Procedure: ARTHROPLASTY, SHOULDER, TOTAL, REVERSE;  Surgeon: Dozier Soulier, MD;  Location: [facility];  Service: Orthopedics;  Laterality: Right;   TONSILLECTOMY     as a child   TOTAL KNEE REVISION Right 12/01/2020   Procedure: RIGHT TOTAL KNEE REVISION;  Surgeon: Liam Lerner, MD;  Location: [facility];  Service: Orthopedics;  Laterality: Right;   Past Medical History:  Diagnosis Date   Anxiety    Arthritis    knees, hands  osteoarthritis   Depression    Fibromyalgia    Insomnia    Lupus anticoagulant positive    undefined after extensive testing since 2022  abnormal coagulation profile   Osteopenia    Pre-diabetes    BP 115/67   Pulse 71   Ht 4' 9 (1.448 m)   Wt 138 lb (62.6 kg)   SpO2 97%   BMI 29.86 kg/m   Opioid Risk Score:   Fall Risk Score:  `1  Depression screen PHQ 2/9     09/19/2023    2:08 PM 03/22/2023   12:56 PM 12/20/2022    1:16 PM  Depression screen PHQ 2/9  Decreased Interest 0 0 2  Down, Depressed, Hopeless 0 0 2  PHQ - 2 Score 0 0 4  Altered sleeping   1  Tired, decreased energy   3  Change in appetite   0  Feeling bad or failure about yourself    2  Trouble concentrating   0  Moving slowly or fidgety/restless   0  Suicidal thoughts   0  PHQ-9 Score   10  Difficult doing work/chores   Somewhat difficult     Review of Systems  Neurological:  Positive for tremors and weakness.  All other systems reviewed and are negative.      Objective:   Physical Exam  Gen: no distress, normal appearing HEENT: oral mucosa pink and moist, NCAT Cardio: Reg rate Chest: normal effort, normal rate of breathing Abd: soft, non-distended Ext: no edema Psych: pleasant, normal affect Skin: intact Neuro: Alert and oriented x3 MSK: diffuse tenderness bilaterally upper and lower extremities      Assessment & Plan:   1) Chronic Pain Syndrome  secondary to fibromyalgia -continue tramadol -Discussed current symptoms of pain and history of pain.  -Discussed benefits of exercise in reducing pain. -start aquatherapy, discussed that she finds the warmth of the water  comforting, discussed that exercise is one of the most beneficial  treatments for fibromyalgia -topamax  25mg  prescribed HS -food allergy testing ordered -discussed that she does not feel depression but feels discussed with her body_ -Discussed current symptoms of pain and history of pain.  -Discussed benefits of exercise in reducing pain. -Discussed following foods that may reduce pain: 1) Ginger (especially studied for arthritis)- reduce leukotriene production to decrease inflammation 2) Blueberries- high in phytonutrients that decrease inflammation 3) Salmon- marine omega-3s reduce joint swelling and pain 4) Pumpkin seeds- reduce inflammation 5) dark chocolate- reduces inflammation 6) turmeric- reduces inflammation 7) tart cherries - reduce pain and stiffness 8) extra virgin olive oil - its compound olecanthal helps to block prostaglandins  9) chili peppers- can be eaten or applied topically via capsaicin 10) mint- helpful for headache, muscle aches, joint pain, and itching 11) garlic- reduces inflammation 12) Green tea- reduces inflammation and oxidative stress, helps with weight loss, may reduce the risk of cancer, recommend Double [employer] of Tea daily  Link to further information on diet for chronic pain: [email or web address].bray.com/   Benefits of turmeric:  -Highly anti-inflammatory  -Increases antioxidants  -Improves memory, attention, brain disease  -Lowers risk of heart disease  -May help prevent cancer  -Decreases pain  -Alleviates depression  -Delays aging and decreases risk of chronic disease  -Consume with black pepper to increase absorption    Turmeric Milk  Recipe:  1 cup milk  1 tsp turmeric  1 tsp cinnamon  1 tsp grated ginger (optional)  Black pepper (boosts the anti-inflammatory properties of turmeric).  1 tsp honey   2) s/p right TKA revision: -discussed that she continues to have pain here  3) Bilateral shoulder pain: -discussed that she been told she would benefit from shoulder replacements  4) Insomnia: -topamax  25mg  prescribed HS -recommended taking tart cherry po -Try to go outside near sunrise -Get exercise during the day.  -Turn off all devices an hour before bedtime.  -Teas that can benefit: chamomile, valerian root, Brahmi (Bacopa) -Can consider over the counter melatonin, magnesium, and/or L-theanine. Melatonin is an anti-oxidant with multiple health benefits. Magnesium is involved in greater than 300 enzymatic reactions in the body and most of us  are deficient as our soil is often depleted. There are 7 different types of magnesium- Bioptemizer's is a supplement with all 7 types, and each has unique benefits. Magnesium can also help with constipation and anxiety.  -Pistachios naturally increase the production of melatonin -Cozy Earth bamboo bed sheets are free from toxic chemicals.  -Tart cherry juice or a tart cherry supplement can improve sleep and soreness post-workout    5) Fatigue:  -continue vitamin D  supplement -recommended high dose ergocalciferol  50,000U once per week on top of this as vitamin D  level is in supoptimal range, discussed changing topamax  to 50mg  at night  6) Overweight: -topamax  25mg  prescribed HS  7) Bilateral shoulder pain -continue heat -discussed positive response to surgeries  Prescribing Home Zynex NexWave Stimulator Device and supplies as needed. IFC, NMES and TENS medically necessary Treatment Rx: Daily @ 30-40 minutes per treatment PRN. Zynex NexWave only, no substitutions. Treatment Goals: 1) To reduce and/or eliminate pain 2) To improve functional capacity and Activities of  daily living 3) To reduce or prevent the need for oral medications 4) To improve circulation in the injured region 5) To decrease or prevent muscle spasm and muscle atrophy 6) To provide a self-management tool to the patient The patient has not sufficiently improved with conservative care. Numerous studies indexed by Medline and PubMed.gov have  shown Neuromuscular, Interferential, and TENS stimulators to reduce pain, improve function, and reduce medication use in injured patients. Continued use of this evidence based, safe, drug free treatment is both reasonable and medically necessary at this time.    Discussed benefits of sauna -discussed that she is unable to tolerate exercises shoulder  Foods that may reduce pain: 1) Ginger (especially studied for arthritis)- reduce leukotriene production to decrease inflammation 2) Blueberries- high in phytonutrients that decrease inflammation 3) Salmon- marine omega-3s reduce joint swelling and pain 4) Pumpkin seeds- reduce inflammation 5) dark chocolate- reduces inflammation 6) turmeric- reduces inflammation 7) tart cherries - reduce pain and stiffness 8) extra virgin olive oil - its compound olecanthal helps to block prostaglandins  9) chili peppers- can be eaten or applied topically via capsaicin 10) mint- helpful for headache, muscle aches, joint pain, and itching 11) garlic- reduces inflammation 12) Green tea- reduces inflammation and oxidative stress, helps with weight loss, may reduce the risk of cancer, recommend Double Green [place] of Tea daily  Link to further information on diet for chronic pain: http://www.bray.com/

## 2023-09-19 NOTE — Patient Instructions (Addendum)
 EMDR therapy Erin Cooley-Gross  Deep Roots- CBD  Insomnia: -Try to go outside near sunrise -Get exercise during the day.  -Turn off all devices an hour before bedtime.  -Teas that can benefit: chamomile, valerian root, Brahmi (Bacopa) -Can consider over the counter melatonin, magnesium, and/or L-theanine. Melatonin is an anti-oxidant with multiple health benefits. Magnesium is involved in greater than 300 enzymatic reactions in the body and most of us  are deficient as our soil is often depleted. There are 7 different types of magnesium- Bioptemizer's is a supplement with all 7 types, and each has unique benefits. Magnesium can also help with constipation and anxiety.  -Pistachios naturally increase the production of melatonin -Cozy Earth bamboo bed sheets are free from toxic chemicals.  -Tart cherry juice or a tart cherry supplement can improve sleep and soreness post-workout    Foods that may reduce pain: 1) Ginger (especially studied for arthritis)- reduce leukotriene production to decrease inflammation 2) Blueberries- high in phytonutrients that decrease inflammation 3) Salmon- marine omega-3s reduce joint swelling and pain 4) Pumpkin seeds- reduce inflammation 5) dark chocolate- reduces inflammation 6) turmeric- reduces inflammation 7) tart cherries - reduce pain and stiffness 8) extra virgin olive oil - its compound olecanthal helps to block prostaglandins  9) chili peppers- can be eaten or applied topically via capsaicin 10) mint- helpful for headache, muscle aches, joint pain, and itching 11) garlic- reduces inflammation 12) Green tea- reduces inflammation and oxidative stress, helps with weight loss, may reduce the risk of cancer, recommend Double Green Matcha [place] of Tea daily  Link to further information on diet for chronic pain: http://www.bray.com/

## 2023-09-21 DIAGNOSIS — L821 Other seborrheic keratosis: Secondary | ICD-10-CM | POA: Diagnosis not present

## 2023-09-21 DIAGNOSIS — L814 Other melanin hyperpigmentation: Secondary | ICD-10-CM | POA: Diagnosis not present

## 2023-09-21 DIAGNOSIS — D225 Melanocytic nevi of trunk: Secondary | ICD-10-CM | POA: Diagnosis not present

## 2023-09-22 DIAGNOSIS — M25511 Pain in right shoulder: Secondary | ICD-10-CM | POA: Diagnosis not present

## 2023-09-22 DIAGNOSIS — R531 Weakness: Secondary | ICD-10-CM | POA: Diagnosis not present

## 2023-09-22 DIAGNOSIS — M25611 Stiffness of right shoulder, not elsewhere classified: Secondary | ICD-10-CM | POA: Diagnosis not present

## 2023-09-26 DIAGNOSIS — Z96611 Presence of right artificial shoulder joint: Secondary | ICD-10-CM | POA: Diagnosis not present

## 2023-09-26 DIAGNOSIS — M25611 Stiffness of right shoulder, not elsewhere classified: Secondary | ICD-10-CM | POA: Diagnosis not present

## 2023-09-26 DIAGNOSIS — M6281 Muscle weakness (generalized): Secondary | ICD-10-CM | POA: Diagnosis not present

## 2023-09-29 DIAGNOSIS — Z96611 Presence of right artificial shoulder joint: Secondary | ICD-10-CM | POA: Diagnosis not present

## 2023-09-29 DIAGNOSIS — M25611 Stiffness of right shoulder, not elsewhere classified: Secondary | ICD-10-CM | POA: Diagnosis not present

## 2023-09-29 DIAGNOSIS — M6281 Muscle weakness (generalized): Secondary | ICD-10-CM | POA: Diagnosis not present

## 2023-10-03 DIAGNOSIS — M25611 Stiffness of right shoulder, not elsewhere classified: Secondary | ICD-10-CM | POA: Diagnosis not present

## 2023-10-03 DIAGNOSIS — M6281 Muscle weakness (generalized): Secondary | ICD-10-CM | POA: Diagnosis not present

## 2023-10-03 DIAGNOSIS — Z96611 Presence of right artificial shoulder joint: Secondary | ICD-10-CM | POA: Diagnosis not present

## 2023-10-06 DIAGNOSIS — Z96611 Presence of right artificial shoulder joint: Secondary | ICD-10-CM | POA: Diagnosis not present

## 2023-10-06 DIAGNOSIS — M6281 Muscle weakness (generalized): Secondary | ICD-10-CM | POA: Diagnosis not present

## 2023-10-06 DIAGNOSIS — M25611 Stiffness of right shoulder, not elsewhere classified: Secondary | ICD-10-CM | POA: Diagnosis not present

## 2023-10-11 DIAGNOSIS — M6281 Muscle weakness (generalized): Secondary | ICD-10-CM | POA: Diagnosis not present

## 2023-10-11 DIAGNOSIS — R531 Weakness: Secondary | ICD-10-CM | POA: Diagnosis not present

## 2023-10-11 DIAGNOSIS — M25611 Stiffness of right shoulder, not elsewhere classified: Secondary | ICD-10-CM | POA: Diagnosis not present

## 2023-10-11 DIAGNOSIS — M25511 Pain in right shoulder: Secondary | ICD-10-CM | POA: Diagnosis not present

## 2023-10-11 DIAGNOSIS — Z96611 Presence of right artificial shoulder joint: Secondary | ICD-10-CM | POA: Diagnosis not present

## 2023-10-13 DIAGNOSIS — M25611 Stiffness of right shoulder, not elsewhere classified: Secondary | ICD-10-CM | POA: Diagnosis not present

## 2023-10-13 DIAGNOSIS — Z96611 Presence of right artificial shoulder joint: Secondary | ICD-10-CM | POA: Diagnosis not present

## 2023-10-13 DIAGNOSIS — M6281 Muscle weakness (generalized): Secondary | ICD-10-CM | POA: Diagnosis not present

## 2023-10-17 DIAGNOSIS — M25611 Stiffness of right shoulder, not elsewhere classified: Secondary | ICD-10-CM | POA: Diagnosis not present

## 2023-10-17 DIAGNOSIS — Z96611 Presence of right artificial shoulder joint: Secondary | ICD-10-CM | POA: Diagnosis not present

## 2023-10-17 DIAGNOSIS — M6281 Muscle weakness (generalized): Secondary | ICD-10-CM | POA: Diagnosis not present

## 2023-10-19 NOTE — Progress Notes (Signed)
 Office Visit Note  Patient: Morgan Reed             Date of Birth: 03-Oct-1945           MRN: [id number]             PCP: Cristopher Bottcher, NP Referring: Alben Therisa MATSU, [place] Visit Date: 11/02/2023 Occupation: @GUAROCC @  Subjective:  Routine follow-up  History of Present Illness: Morgan Reed is a 78 y.o. female with history of polyarthralgia and positive ANA.  Patient was last seen in the office on 11/02/2022.  Patient reports that since her last office visit she underwent reverse total shoulder arthroplasty on the left side on 02/17/2023 and on the right side and 06/09/2023.  Both surgeries were performed by Dr. Dozier.  She has completed formal physical therapy and has continued home physical therapy 4 days a week.  She has been very diligent completing physical therapy and has noticed an improvement in her pain levels and range of motion.  Patient is hoping to return to water aerobics in September. Patient states that overall since her last office visit she has had less pain and less fatigue.  She states she continues to have occasional facial flushing but denies any other clinical features of autoimmune disease at this time.     Activities of Daily Living:  Patient reports morning stiffness for less than 15 minutes.   Patient Reports nocturnal pain.  Difficulty dressing/grooming: Denies Difficulty climbing stairs: Denies Difficulty getting out of chair: Denies Difficulty using hands for taps, buttons, cutlery, and/or writing: Reports  Review of Systems  Constitutional:  Positive for fatigue.  HENT:  Positive for mouth dryness. Negative for mouth sores.   Eyes:  Positive for dryness.  Respiratory:  Negative for shortness of breath.   Cardiovascular:  Negative for chest pain and palpitations.  Gastrointestinal:  Negative for blood in stool, constipation and diarrhea.  Endocrine: Negative for increased urination.  Genitourinary:  Negative for involuntary urination.   Musculoskeletal:  Positive for joint pain, joint pain, myalgias, morning stiffness and myalgias. Negative for gait problem, joint swelling, muscle weakness and muscle tenderness.  Skin:  Negative for color change, rash, hair loss and sensitivity to sunlight.  Allergic/Immunologic: Negative for susceptible to infections.  Neurological:  Negative for dizziness and headaches.  Hematological:  Negative for swollen glands.  Psychiatric/Behavioral:  Positive for sleep disturbance. Negative for depressed mood. The patient is not nervous/anxious.     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   Lupus anticoagulant positive 11/07/2020   Osteoarthritis 10/31/2020   Hyperlipidemia 10/31/2020   Obesity, Class I, BMI 30-34.9 10/31/2020   Osteopenia 10/31/2020   Insomnia 10/31/2020   Abnormal coagulation profile 10/31/2020   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) 10/31/2020    Past Medical History:  Diagnosis Date   Anxiety    Arthritis    knees, hands  osteoarthritis   Depression    Fibromyalgia    Insomnia    Lupus anticoagulant positive    undefined after extensive testing since 2022  abnormal coagulation profile   Osteopenia    Pre-diabetes     Family History  Problem Relation Age of Onset   Cancer Mother  leukemia   Cancer Brother        liver cancer   Healthy Son    Healthy Daughter    Past Surgical History:  Procedure Laterality Date   ABDOMINAL HYSTERECTOMY  1995   complication with bowel tear   BREAST BIOPSY     right breast benign approx 1990   BREAST EXCISIONAL BIOPSY Right    right breast benign 1990   BUNIONECTOMY  2000   EYE SURGERY Bilateral 2014   cataracts    REPLACEMENT TOTAL KNEE Right 2013   REVERSE SHOULDER ARTHROPLASTY Left 02/17/2023   Procedure: REVERSE SHOULDER ARTHROPLASTY;  Surgeon: Dozier Soulier, MD;  Location: [facility];  Service: Orthopedics;  Laterality: Left;   REVERSE SHOULDER ARTHROPLASTY Right 06/09/2023   Procedure: ARTHROPLASTY, SHOULDER, TOTAL, REVERSE;  Surgeon: Dozier Soulier, MD;  Location: [facility];  Service: Orthopedics;  Laterality: Right;   TONSILLECTOMY     as a child   TOTAL KNEE REVISION Right 12/01/2020   Procedure: RIGHT TOTAL KNEE REVISION;  Surgeon: Liam Lerner, MD;  Location: [facility];  Service: Orthopedics;  Laterality: Right;   Social History   Social History Narrative   Not on file   Immunization History  Administered Date(s) Administered   PFIZER(Purple Top)SARS-COV-2 Vaccination 04/13/2019, 05/08/2019     Objective: Vital Signs: BP 113/73 (BP Location: Left Arm, Patient Position: Sitting, Cuff Size: Normal)   Pulse 87   Resp 14   Ht 4' 10 (1.473 m)   Wt 137 lb 3.2 oz (62.2 kg)   BMI 28.67 kg/m    Physical Exam Vitals and nursing note reviewed.  Constitutional:      Appearance: She is well-developed.  HENT:     Head: Normocephalic and atraumatic.  Eyes:     Conjunctiva/sclera: Conjunctivae normal.  Cardiovascular:     Rate and Rhythm: Normal rate and regular rhythm.     Heart sounds: Normal heart sounds.  Pulmonary:     Effort: Pulmonary effort is normal.     Breath sounds: Normal breath sounds.  Abdominal:     General: Bowel sounds are normal.     Palpations: Abdomen is soft.  Musculoskeletal:     Cervical back: Normal range of motion.  Lymphadenopathy:     Cervical: No cervical adenopathy.  Skin:    General: Skin is warm and dry.     Capillary Refill: Capillary refill takes less than 2 seconds.  Neurological:     Mental Status: She is alert and oriented to person, place, and time.  Psychiatric:        Behavior: Behavior normal.      Musculoskeletal Exam: C-spine has good  ROM.  Bilateral shoulder replacements have slightly limited ROM with internal rotation. Elbow joints have good ROM. Wrist joints, MCPs, PIPs, and DIPs good ROM with no synovitis.  Complete fist formation bilaterally.  Hip joints have good ROM with no groin pain.  Left knee replacement has good ROM.  Ankle joints have good ROM with no tenderness or joint swelling.  CDAI Exam: CDAI Score: -- Patient Global: --; Provider Global: -- Swollen: --; Tender: -- Joint Exam 11/02/2023   No joint exam has been documented for this visit   There is currently no information documented on the homunculus. Go to the Rheumatology activity and complete the homunculus joint exam.  Investigation: No additional findings.  Imaging: No results found.  Recent Labs: Lab Results  Component Value Date   WBC 6.6 02/14/2023   HGB 11.1 (L) 06/10/2023  PLT 284 02/14/2023   NA 140 11/02/2022   K 4.4 11/02/2022   CL 107 11/02/2022   CO2 23 11/02/2022   GLUCOSE 96 11/02/2022   BUN 27 (H) 11/02/2022   CREATININE 1.05 (H) 11/02/2022   BILITOT 0.3 11/02/2022   ALKPHOS 84 10/31/2020   AST 17 11/02/2022   ALT 16 11/02/2022   PROT 6.7 11/02/2022   ALBUMIN 4.0 10/31/2020   CALCIUM 10.0 11/02/2022    Speciality Comments: No specialty comments available.  Procedures:  No procedures performed Allergies: Atorvastatin       Assessment / Plan:     Visit Diagnoses: Positive ANA (antinuclear antibody):  ANA 1: 80NH, ENA negative, C3-C4 normal, anticardiolipin and beta-2 GP 1 negative: She has not developed any new or worsening symptoms since her last office visit on 11/02/2022.  She has occasional facial flushing but has not had any other recent rashes.  No oral or nasal ulcerations.  She has chronic sicca symptoms which have been mild.  She has no synovitis on examination today.  Her energy level has been stable. Lab work on 11/02/2022 was reviewed today in the office: ANA 1: 40 NH, CRP within normal limits,  anti-CCP negative, RF negative, lupus anticoagulant detected, anticardiolipin antibodies negative, beta-2 glycoprotein antibodies negative, ESR within normal limits, complements within normal limits, urine protein creatinine ratio within normal limits, double-stranded a negative. Plan to update the following lab work today but the patient requested to hold off at this time since she has not developed any new or worsening clinical features.  Discussed signs and symptoms to monitor for closely.  Plan to place future orders for the following lab work today.  She will follow up in 6 months or sooner if needed.    Lupus anticoagulant positive: 10/31/2020, 11/11/2020, and 11/02/22.  She remains on Aspirin 81 mg 2 tablets daily.  Evaluated by Dr. Lonn in the past.  No history of VTE. Anticardiolipin ab and beta-2 GP negative on 01/07/21 and 11/02/2022.  Lupus anticoagulant was detected on 11/02/2022. Offered to recheck lupus anticoagulant along with routine autoimmune lab work but she would like to hold off at this time.  Future orders will be placed today.  S/P reverse total shoulder arthroplasty, right - Dr. Dozier 06/09/23-doing well.  She has continued home exercises.   S/P reverse total shoulder arthroplasty, left - Dr. Chandler-02/17/23. She has continued home exercises.   S/P revision of total knee, right - - December 01, 2020 by Dr. Liam. Recurrent pes anserine bursitis-recent injection performed.  Trochanteric bursitis, left hip - Cortisone injection performed on 02/10/21.  Other medical conditions are listed as follows:   History of hyperlipidemia  History of depression  Other insomnia  Orders: No orders of the defined types were placed in this encounter.  No orders of the defined types were placed in this encounter.   Follow-Up Instructions: Return in about 1 year (around 11/01/2024) for +ANA.   Morgan CHRISTELLA Craze, PA-C  Note - This record has been created using Dragon software.   Chart creation errors have been sought, but may not always  have been located. Such creation errors do not reflect on  the standard of medical care.

## 2023-10-21 DIAGNOSIS — M6281 Muscle weakness (generalized): Secondary | ICD-10-CM | POA: Diagnosis not present

## 2023-10-21 DIAGNOSIS — Z96611 Presence of right artificial shoulder joint: Secondary | ICD-10-CM | POA: Diagnosis not present

## 2023-10-21 DIAGNOSIS — M25611 Stiffness of right shoulder, not elsewhere classified: Secondary | ICD-10-CM | POA: Diagnosis not present

## 2023-10-25 DIAGNOSIS — M7052 Other bursitis of knee, left knee: Secondary | ICD-10-CM | POA: Diagnosis not present

## 2023-11-02 ENCOUNTER — Ambulatory Visit: Payer: Medicare Other | Attending: Physician Assistant | Admitting: Physician Assistant

## 2023-11-02 ENCOUNTER — Encounter: Payer: Self-pay | Admitting: Physician Assistant

## 2023-11-02 VITALS — BP 113/73 | HR 87 | Resp 14 | Ht <= 58 in | Wt 137.2 lb

## 2023-11-02 DIAGNOSIS — Z8659 Personal history of other mental and behavioral disorders: Secondary | ICD-10-CM | POA: Insufficient documentation

## 2023-11-02 DIAGNOSIS — G4709 Other insomnia: Secondary | ICD-10-CM | POA: Insufficient documentation

## 2023-11-02 DIAGNOSIS — Z8639 Personal history of other endocrine, nutritional and metabolic disease: Secondary | ICD-10-CM | POA: Diagnosis not present

## 2023-11-02 DIAGNOSIS — M7062 Trochanteric bursitis, left hip: Secondary | ICD-10-CM | POA: Insufficient documentation

## 2023-11-02 DIAGNOSIS — R768 Other specified abnormal immunological findings in serum: Secondary | ICD-10-CM | POA: Diagnosis not present

## 2023-11-02 DIAGNOSIS — Z96651 Presence of right artificial knee joint: Secondary | ICD-10-CM | POA: Insufficient documentation

## 2023-11-02 DIAGNOSIS — Z96612 Presence of left artificial shoulder joint: Secondary | ICD-10-CM | POA: Diagnosis not present

## 2023-11-02 DIAGNOSIS — S99911D Unspecified injury of right ankle, subsequent encounter: Secondary | ICD-10-CM

## 2023-11-02 DIAGNOSIS — Z96611 Presence of right artificial shoulder joint: Secondary | ICD-10-CM | POA: Insufficient documentation

## 2023-11-02 DIAGNOSIS — G8929 Other chronic pain: Secondary | ICD-10-CM

## 2023-11-02 DIAGNOSIS — R760 Raised antibody titer: Secondary | ICD-10-CM | POA: Diagnosis not present

## 2023-11-16 DIAGNOSIS — M1712 Unilateral primary osteoarthritis, left knee: Secondary | ICD-10-CM | POA: Diagnosis not present

## 2023-11-29 DIAGNOSIS — Z23 Encounter for immunization: Secondary | ICD-10-CM | POA: Diagnosis not present

## 2023-12-01 DIAGNOSIS — M1712 Unilateral primary osteoarthritis, left knee: Secondary | ICD-10-CM | POA: Diagnosis not present

## 2023-12-08 DIAGNOSIS — M1712 Unilateral primary osteoarthritis, left knee: Secondary | ICD-10-CM | POA: Diagnosis not present

## 2023-12-09 DIAGNOSIS — M25511 Pain in right shoulder: Secondary | ICD-10-CM | POA: Diagnosis not present

## 2023-12-20 ENCOUNTER — Encounter: Payer: Self-pay | Admitting: Physical Medicine and Rehabilitation

## 2023-12-20 ENCOUNTER — Encounter: Attending: Physical Medicine and Rehabilitation | Admitting: Physical Medicine and Rehabilitation

## 2023-12-20 VITALS — BP 127/75 | HR 83 | Ht <= 58 in | Wt 138.6 lb

## 2023-12-20 DIAGNOSIS — R52 Pain, unspecified: Secondary | ICD-10-CM | POA: Insufficient documentation

## 2023-12-20 DIAGNOSIS — Z96611 Presence of right artificial shoulder joint: Secondary | ICD-10-CM | POA: Insufficient documentation

## 2023-12-20 DIAGNOSIS — G4701 Insomnia due to medical condition: Secondary | ICD-10-CM | POA: Insufficient documentation

## 2023-12-20 MED ORDER — TOPIRAMATE 50 MG PO TABS: 50.0000 mg | ORAL_TABLET | Freq: Every day | ORAL | 3 refills | Status: DC

## 2023-12-20 NOTE — Progress Notes (Signed)
 Subjective:    Patient ID: Morgan Reed, female    DOB: [birth year], 78 y.o.   MRN: [id number]  HPI [name] is a 78 year old woman who presents with diffuse pain.  1) Diffuse pain -still takes topamax , needs refill -has been present for a couple of years -her ANA was positive -she gets  a rash on her face and was told it is not rosacea -she was checked for lupus -she was never she told she has fibromyalgia -she takes 25mg  topamax  for weight loss but it has not helped her pain- she takes this during the day, she would be willing to try an increased dose  2) 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   3) Fatigue: -she takes 25mg  daily topamax  -she saw her vitamin D  level was normal  4) Left shoulder pain: -improved since surgery -weather worsens the pain  5) Right shoulder pain: -she is so glad she had surgery because her pain has really improved  6) Insomnia: -worsened since sh stopped topamax   Pain Inventory Average Pain 5 Pain Right Now 5 My pain is dull In the last 24 hours, has pain interfered with the following? General activity 2 Relation with others 2 Enjoyment of life 2 What TIME of day is your pain at its worst? daytime Sleep (in general) Fair  Pain is worse with: walking, bending, and some activites, standing Pain improves with: heat/ice, therapy/exercise, medication, and injections Relief from Meds: 8   Family History  Problem Relation Age of Onset   Cancer Mother        leukemia   Cancer Brother        liver cancer   Healthy Son    Healthy Daughter    Social History   Socioeconomic History   Marital status: Widowed    Spouse name: Not on file   Number of children: Not on file   Years of education: Not on file   Highest education level: Not on file  Occupational History   Not on file  Tobacco Use    Smoking status: Former    Current packs/day: 0.00    Types: Cigarettes    Quit date: 56    Years since quitting: 47.8    Passive exposure: Never   Smokeless tobacco: Never  Vaping Use   Vaping status: Never Used  Substance and Sexual Activity   Alcohol  use: Not Currently    Comment: rare   none in a long time   Drug use: Not Currently   Sexual activity: Not Currently  Other Topics Concern   Not on file  Social History Narrative   Not on file   Social Drivers of Health   Financial Resource Strain: Not on file  Food Insecurity: No Food Insecurity (06/09/2023)   Hunger Vital Sign    Worried About Running Out of Food in the Last Year: Never true    Ran Out of Food in the Last Year: Never true  Transportation Needs: No Transportation Needs (06/09/2023)   PRAPARE - Administrator, Civil Service (Medical): No    Lack of Transportation (Non-Medical): No  Physical Activity: Not on file  Stress: Not on file  Social Connections: Moderately Integrated (06/09/2023)   Social Connection and Isolation Panel    Frequency of Communication with Friends and Family:  More than three times a week    Frequency of Social Gatherings with Friends and Family: More than three times a week    Attends Religious Services: More than 4 times per year    Active Member of Clubs or Organizations: Yes    Attends Banker Meetings: More than 4 times per year    Marital Status: Widowed   Past Surgical History:  Procedure Laterality Date   ABDOMINAL HYSTERECTOMY  1995   complication with bowel tear   BREAST BIOPSY     right breast benign approx 1990   BREAST EXCISIONAL BIOPSY Right    right breast benign 1990   BUNIONECTOMY  2000   EYE SURGERY Bilateral 2014   cataracts   REPLACEMENT TOTAL KNEE Right 2013   REVERSE SHOULDER ARTHROPLASTY Left 02/17/2023   Procedure: REVERSE SHOULDER ARTHROPLASTY;  Surgeon: Dozier Soulier, MD;  Location: [facility];  Service: Orthopedics;  Laterality:  Left;   REVERSE SHOULDER ARTHROPLASTY Right 06/09/2023   Procedure: ARTHROPLASTY, SHOULDER, TOTAL, REVERSE;  Surgeon: Dozier Soulier, MD;  Location: [facility];  Service: Orthopedics;  Laterality: Right;   TONSILLECTOMY     as a child   TOTAL KNEE REVISION Right 12/01/2020   Procedure: RIGHT TOTAL KNEE REVISION;  Surgeon: Liam Lerner, MD;  Location: [facility];  Service: Orthopedics;  Laterality: Right;   Past Medical History:  Diagnosis Date   Anxiety    Arthritis    knees, hands  osteoarthritis   Depression    Fibromyalgia    Insomnia    Lupus anticoagulant positive    undefined after extensive testing since 2022  abnormal coagulation profile   Osteopenia    Pre-diabetes    BP 127/75 (Patient Position: Sitting, Cuff Size: Normal)   Pulse 83   Ht 4' 10 (1.473 m)   Wt 138 lb 9.6 oz (62.9 kg)   SpO2 98%   BMI 28.97 kg/m   Opioid Risk Score:   Fall Risk Score:  `1  Depression screen PHQ 2/9     09/19/2023    2:08 PM 03/22/2023   12:56 PM 12/20/2022    1:16 PM  Depression screen PHQ 2/9  Decreased Interest 0 0 2  Down, Depressed, Hopeless 0 0 2  PHQ - 2 Score 0 0 4  Altered sleeping   1  Tired, decreased energy   3  Change in appetite   0  Feeling bad or failure about yourself    2  Trouble concentrating   0  Moving slowly or fidgety/restless   0  Suicidal thoughts   0  PHQ-9 Score   10  Difficult doing work/chores   Somewhat difficult     Review of Systems  Neurological:  Positive for tremors and weakness.  All other systems reviewed and are negative.      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  -Discussed benefits of exercise in reducing pain. -start aquatherapy, discussed that she finds the warmth of the water  comforting, discussed that exercise is one of the most  beneficial treatments for fibromyalgia -food allergy testing ordered -discussed that she does not feel depression but feels discussed with her body_ Chronic Pain Syndrome secondary to___ -Discussed current symptoms of pain and history of pain.  -Discussed benefits of exercise in reducing pain. -Discussed following foods that may reduce pain: 1) Ginger (especially studied for arthritis)- reduce leukotriene production to decrease inflammation 2) Blueberries- high in phytonutrients that decrease inflammation 3) Salmon- marine omega-3s reduce joint swelling and pain 4) Pumpkin seeds- reduce inflammation 5) dark chocolate- reduces inflammation 6) turmeric- reduces inflammation 7) tart cherries - reduce pain and stiffness 8) extra virgin olive oil - its compound olecanthal helps to block prostaglandins  9) chili peppers- can be eaten or applied topically via capsaicin 10) mint- helpful for headache, muscle aches, joint pain, and itching 11) garlic- reduces inflammation 12) Green tea- reduces inflammation and oxidative stress, helps with weight loss, may reduce the risk of cancer, recommend Double [employer] of Tea daily  Link to further information on diet for chronic pain: [email or web address].bray.[email or web address]   Benefits of turmeric:  -Highly anti-inflammatory  -Increases antioxidants  -Improves memory, attention, brain disease  -Lowers risk of heart disease  -May help prevent cancer  -Decreases pain  -Alleviates depression  -Delays aging and decreases risk of chronic disease  -Consume with black pepper to increase  absorption    Turmeric Milk Recipe:  1 cup milk  1 tsp turmeric  1 tsp cinnamon  1 tsp grated ginger (optional)  Black pepper (boosts the anti-inflammatory properties of turmeric).  1 tsp honey   2) s/p right TKA revision: -discussed that she continues to have pain here  3) Bilateral shoulder pain: -discussed that she been told she would benefit from shoulder replacements  4) Insomnia: -refilled topmax 50mg  HS -discussed benefits of melatonin -recommended taking tart cherry po -Try to go outside near sunrise -Get exercise during the day.  -Turn off all devices an hour before bedtime.  -Teas that can benefit: chamomile, valerian root, Brahmi (Bacopa) -Can consider over the counter melatonin, magnesium, and/or L-theanine. Melatonin is an anti-oxidant with multiple health benefits. Magnesium is involved in greater than 300 enzymatic reactions in the body and most of us  are deficient as our soil is often depleted. There are 7 different types of magnesium- Bioptemizer's is a supplement with all 7 types, and each has unique benefits. Magnesium can also help with constipation and anxiety.  -Pistachios naturally increase the production of melatonin -Cozy Earth bamboo bed sheets are free from toxic chemicals.  -Tart cherry juice or a tart cherry supplement can improve sleep and soreness post-workout    5) Fatigue:  -continue vitamin D  supplement -recommended high dose ergocalciferol  50,000U once per week on top of this as vitamin D  level is in supoptimal range, discussed changing topamax  to 50mg  at night  6) Overweight: -topamax  25mg  prescribed HS  7) Bilateral shoulder pain -continue heat -discussed positive response to surgeries -discussed that she is so happy that she has had both of these done  Prescribing Home Zynex NexWave Stimulator Device and supplies as needed. IFC, NMES and TENS medically necessary Treatment Rx: Daily @ 30-40 minutes per treatment PRN. Zynex NexWave  only, no substitutions. Treatment Goals: 1) To reduce and/or eliminate pain 2) To improve functional capacity and Activities of daily living 3) To reduce or prevent the need for oral medications 4) To improve circulation in the injured region 5) To decrease or prevent muscle spasm and muscle atrophy 6) To provide a self-management tool  to the patient The patient has not sufficiently improved with conservative care. Numerous studies indexed by Medline and PubMed.gov have shown Neuromuscular, Interferential, and TENS stimulators to reduce pain, improve function, and reduce medication use in injured patients. Continued use of this evidence based, safe, drug free treatment is both reasonable and medically necessary at this time.    Discussed benefits of sauna -discussed that she is unable to tolerate exercises shoulder  Foods that may reduce pain: 1) Ginger (especially studied for arthritis)- reduce leukotriene production to decrease inflammation 2) Blueberries- high in phytonutrients that decrease inflammation 3) Salmon- marine omega-3s reduce joint swelling and pain 4) Pumpkin seeds- reduce inflammation 5) dark chocolate- reduces inflammation 6) turmeric- reduces inflammation 7) tart cherries - reduce pain and stiffness 8) extra virgin olive oil - its compound olecanthal helps to block prostaglandins  9) chili peppers- can be eaten or applied topically via capsaicin 10) mint- helpful for headache, muscle aches, joint pain, and itching 11) garlic- reduces inflammation 12) Green tea- reduces inflammation and oxidative stress, helps with weight loss, may reduce the risk of cancer, recommend Double Green [place] of Tea daily  Link to further information on diet for chronic pain: http://www.bray.com/

## 2023-12-20 NOTE — Patient Instructions (Addendum)
 Foods for pain: 1) Ginger (especially studied for arthritis)- reduce leukotriene production to decrease inflammation 2) Blueberries- high in phytonutrients that decrease inflammation 3) Salmon- marine omega-3s reduce joint swelling and pain 4) Pumpkin seeds- reduce inflammation 5) dark chocolate- reduces inflammation 6) turmeric- reduces inflammation 7) tart cherries - reduce pain and stiffness 8) extra virgin olive oil - its compound olecanthal helps to block prostaglandins  9) chili peppers- can be eaten or applied topically via capsaicin 10) mint- helpful for headache, muscle aches, joint pain, and itching 11) garlic- reduces inflammation 12) Green tea- reduces inflammation and oxidative stress, helps with weight loss, may reduce the risk of cancer, recommend Double Green Matcha [place] of Tea daily  Link to further information on diet for chronic pain: http://www.bray.com/

## 2024-01-19 DIAGNOSIS — L2989 Other pruritus: Secondary | ICD-10-CM | POA: Diagnosis not present

## 2024-01-20 DIAGNOSIS — G4700 Insomnia, unspecified: Secondary | ICD-10-CM | POA: Diagnosis not present

## 2024-01-20 DIAGNOSIS — E663 Overweight: Secondary | ICD-10-CM | POA: Diagnosis not present

## 2024-01-20 DIAGNOSIS — L299 Pruritus, unspecified: Secondary | ICD-10-CM | POA: Diagnosis not present

## 2024-01-20 DIAGNOSIS — R7303 Prediabetes: Secondary | ICD-10-CM | POA: Diagnosis not present

## 2024-01-20 DIAGNOSIS — M8580 Other specified disorders of bone density and structure, unspecified site: Secondary | ICD-10-CM | POA: Diagnosis not present

## 2024-01-20 DIAGNOSIS — F324 Major depressive disorder, single episode, in partial remission: Secondary | ICD-10-CM | POA: Diagnosis not present

## 2024-01-20 DIAGNOSIS — E785 Hyperlipidemia, unspecified: Secondary | ICD-10-CM | POA: Diagnosis not present

## 2024-01-20 DIAGNOSIS — Z0000 Encounter for general adult medical examination without abnormal findings: Secondary | ICD-10-CM | POA: Diagnosis not present

## 2024-01-20 DIAGNOSIS — M1990 Unspecified osteoarthritis, unspecified site: Secondary | ICD-10-CM | POA: Diagnosis not present

## 2024-01-26 DIAGNOSIS — H01001 Unspecified blepharitis right upper eyelid: Secondary | ICD-10-CM | POA: Diagnosis not present

## 2024-01-26 DIAGNOSIS — H01004 Unspecified blepharitis left upper eyelid: Secondary | ICD-10-CM | POA: Diagnosis not present

## 2024-02-08 DIAGNOSIS — R944 Abnormal results of kidney function studies: Secondary | ICD-10-CM | POA: Diagnosis not present

## 2024-03-07 ENCOUNTER — Telehealth: Payer: Self-pay

## 2024-03-07 NOTE — Telephone Encounter (Signed)
 Refill for Topiramate  25 MG denied. Patient need a follow up appointment.

## 2024-04-11 ENCOUNTER — Telehealth: Payer: Self-pay | Admitting: *Deleted

## 2024-04-11 ENCOUNTER — Encounter: Admitting: Physical Medicine and Rehabilitation

## 2024-04-11 DIAGNOSIS — M797 Fibromyalgia: Secondary | ICD-10-CM | POA: Diagnosis not present

## 2024-04-11 MED ORDER — TOPIRAMATE 25 MG PO TABS: 25.0000 mg | ORAL_TABLET | Freq: Every evening | ORAL | 3 refills | Status: AC

## 2024-04-11 NOTE — Telephone Encounter (Signed)
 Morgan Reed called and is asking for a call back. She is having dental surgery and the dentis wants to know if she should stop her topamax . She is also asking for a refill on her topamax . She reports that she called about a month ago and did not hear anything from us .

## 2024-04-11 NOTE — Progress Notes (Signed)
 "  Subjective:    Patient ID: Morgan Reed, female    DOB: 1945-05-08, 79 y.o.   MRN: [id number]  HPI An audio/video tele-health visit is felt to be the most appropriate encounter for this patient at this time. This is a follow up tele-visit via phone. The patient is at home. MD is at office. Prior to scheduling this appointment, our staff discussed the limitations of evaluation and management by telemedicine and the availability of in-person appointments. The patient expressed understanding and agreed to proceed.   Morgan Reed is a 79 year old woman who presents with diffuse pain.  1) Diffuse pain 2/2 fibromyalgia -still takes topamax , needs refill -she has been doing well overall -she is getting dental work soon and asks whether they are allowed to prescribe pain medication for her -she will be following up with me in March -has been present for a couple of years -her ANA was positive -she gets  a rash on her face and was told it is not rosacea -she was checked for lupus -she was never she told she has fibromyalgia -she takes 25mg  topamax  for weight loss but it has not helped her pain- she takes this during the day, she would be willing to try an increased dose  2) 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   3) Fatigue: -she takes 25mg  daily topamax  -she saw her vitamin D  level was normal  4) Left shoulder pain: -improved since surgery -weather worsens the pain  5) Right shoulder pain: -she is so glad she had surgery because her pain has really improved  6) Insomnia: -worsened since she stopped topamax   Pain Inventory Average Pain 5 Pain Right Now 5 My pain is dull In the last 24 hours, has pain interfered with the following? General activity 2 Relation with others 2 Enjoyment of life 2 What TIME of day is your pain at its worst?  daytime Sleep (in general) Fair  Pain is worse with: walking, bending, and some activites, standing Pain improves with: heat/ice, therapy/exercise, medication, and injections Relief from Meds: 8   Family History  Problem Relation Age of Onset   Cancer Mother        leukemia   Cancer Brother        liver cancer   Healthy Son    Healthy Daughter    Social History   Socioeconomic History   Marital status: Widowed    Spouse name: Not on file   Number of children: Not on file   Years of education: Not on file   Highest education level: Not on file  Occupational History   Not on file  Tobacco Use   Smoking status: Former    Current packs/day: 0.00    Types: Cigarettes    Quit date: 59    Years since quitting: 48.1    Passive exposure: Never   Smokeless tobacco: Never  Vaping Use   Vaping status: Never Used  Substance and Sexual Activity   Alcohol  use: Not Currently    Comment: rare   none in a long time   Drug use: Not Currently   Sexual activity: Not Currently  Other Topics Concern   Not on file  Social History Narrative   Not on file   Social Drivers of Health   Tobacco Use: Medium Risk (12/20/2023)  Patient History    Smoking Tobacco Use: Former    Smokeless Tobacco Use: Never    Passive Exposure: Never  Programmer, Applications: Not on file  Food Insecurity: No Food Insecurity (06/09/2023)   Hunger Vital Sign    Worried About Running Out of Food in the Last Year: Never true    Ran Out of Food in the Last Year: Never true  Transportation Needs: No Transportation Needs (06/09/2023)   PRAPARE - Administrator, Civil Service (Medical): No    Lack of Transportation (Non-Medical): No  Physical Activity: Not on file  Stress: Not on file  Social Connections: Moderately Integrated (06/09/2023)   Social Connection and Isolation Panel    Frequency of Communication with Friends and Family: More than three times a week    Frequency of Social Gatherings  with Friends and Family: More than three times a week    Attends Religious Services: More than 4 times per year    Active Member of [employer] or Organizations: Yes    Attends Banker Meetings: More than 4 times per year    Marital Status: Widowed  Depression (PHQ2-9): Low Risk (09/19/2023)   Depression (PHQ2-9)    PHQ-2 Score: 0  Alcohol  Screen: Not on file  Housing: Low Risk (06/09/2023)   Housing Stability Vital Sign    Unable to Pay for Housing in the Last Year: No    Number of Times Moved in the Last Year: 1    Homeless in the Last Year: No  Utilities: Not At Risk (06/09/2023)   [facility] Utilities    Threatened with loss of utilities: No  Health Literacy: Not on file   Past Surgical History:  Procedure Laterality Date   ABDOMINAL HYSTERECTOMY  1995   complication with bowel tear   BREAST BIOPSY     right breast benign approx 1990   BREAST EXCISIONAL BIOPSY Right    right breast benign 1990   BUNIONECTOMY  2000   EYE SURGERY Bilateral 2014   cataracts   REPLACEMENT TOTAL KNEE Right 2013   REVERSE SHOULDER ARTHROPLASTY Left 02/17/2023   Procedure: REVERSE SHOULDER ARTHROPLASTY;  Surgeon: Dozier Soulier, MD;  Location: [facility];  Service: Orthopedics;  Laterality: Left;   REVERSE SHOULDER ARTHROPLASTY Right 06/09/2023   Procedure: ARTHROPLASTY, SHOULDER, TOTAL, REVERSE;  Surgeon: Dozier Soulier, MD;  Location: [facility];  Service: Orthopedics;  Laterality: Right;   TONSILLECTOMY     as a child   TOTAL KNEE REVISION Right 12/01/2020   Procedure: RIGHT TOTAL KNEE REVISION;  Surgeon: Liam Lerner, MD;  Location: [facility];  Service: Orthopedics;  Laterality: Right;   Past Medical History:  Diagnosis Date   Anxiety    Arthritis    knees, hands  osteoarthritis   Depression    Fibromyalgia    Insomnia    Lupus anticoagulant positive    undefined after extensive testing since 2022  abnormal coagulation profile   Osteopenia    Pre-diabetes    There were no vitals taken for this  visit.  Opioid Risk Score:   Fall Risk Score:  `1  Depression screen PHQ 2/9     09/19/2023    2:08 PM 03/22/2023   12:56 PM 12/20/2022    1:16 PM  Depression screen PHQ 2/9  Decreased Interest 0 0 2  Down, Depressed, Hopeless 0 0 2  PHQ - 2 Score 0 0 4  Altered sleeping   1  Tired, decreased energy   3  Change in appetite   0  Feeling bad or failure about yourself    2  Trouble concentrating   0  Moving slowly or fidgety/restless   0  Suicidal thoughts   0  PHQ-9 Score   10   Difficult doing work/chores   Somewhat difficult     Data saved with a previous flowsheet row definition     Review of Systems  Neurological:  Positive for tremors and weakness.  All other systems reviewed and are negative.      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  -Discussed current symptoms of pain and history of pain.  -Discussed benefits of exercise in reducing pain. -start aquatherapy, discussed that she finds the warmth of the water  comforting, discussed that exercise is one of the most beneficial treatments for fibromyalgia -food allergy testing ordered -discussed that she does not feel depression but feels discussed with her body_ Chronic Pain Syndrome secondary to___ -Discussed current symptoms of pain and history of  pain.  -Discussed benefits of exercise in reducing pain. -Discussed following foods that may reduce pain: 1) Ginger (especially studied for arthritis)- reduce leukotriene production to decrease inflammation 2) Blueberries- high in phytonutrients that decrease inflammation 3) Salmon- marine omega-3s reduce joint swelling and pain 4) Pumpkin seeds- reduce inflammation 5) dark chocolate- reduces inflammation 6) turmeric- reduces inflammation 7) tart cherries - reduce pain and stiffness 8) extra virgin olive oil - its compound olecanthal helps to block prostaglandins  9) chili peppers- can be eaten or applied topically via capsaicin 10) mint- helpful for headache, muscle aches, joint pain, and itching 11) garlic- reduces inflammation 12) Green tea- reduces inflammation and oxidative stress, helps with weight loss, may reduce the risk of cancer, recommend Double [employer] of Tea daily  Link to further information on diet for chronic pain: http://www.bray.com/   Benefits of turmeric:  -Highly anti-inflammatory  -Increases antioxidants  -Improves memory, attention, brain disease  -Lowers risk of heart disease  -May help prevent cancer  -Decreases pain  -Alleviates depression  -Delays aging and decreases risk of chronic disease  -Consume with black pepper to increase absorption    Turmeric Milk Recipe:  1 cup milk  1 tsp turmeric  1 tsp cinnamon  1 tsp grated ginger (optional)  Black pepper (boosts the anti-inflammatory properties of turmeric).  1 tsp honey   2) s/p right TKA revision: -discussed that she continues to have pain here  3) Bilateral shoulder pain: -discussed that she been told she would benefit from shoulder replacements  4) Insomnia: -refilled topmax 50mg  HS -discussed benefits of melatonin -recommended taking tart cherry po -Try to go outside near sunrise -Get  exercise during the day.  -Turn off all devices an hour before bedtime.  -Teas that can benefit: chamomile, valerian root, Brahmi (Bacopa) -Can consider over the counter melatonin, magnesium, and/or L-theanine. Melatonin is  an anti-oxidant with multiple health benefits. Magnesium is involved in greater than 300 enzymatic reactions in the body and most of us  are deficient as our soil is often depleted. There are 7 different types of magnesium- Bioptemizer's is a supplement with all 7 types, and each has unique benefits. Magnesium can also help with constipation and anxiety.  -Pistachios naturally increase the production of melatonin -[employer] bamboo bed sheets are free from toxic chemicals.  -Tart cherry juice or a tart cherry supplement can improve sleep and soreness post-workout    5) Fatigue:  -continue vitamin D  supplement -recommended high dose ergocalciferol  50,000U once per week on top of this as vitamin D  level is in supoptimal range, discussed changing topamax  to 50mg  at night  6) Overweight: -topamax  25mg  prescribed HS  7) Bilateral shoulder pain -continue heat -discussed positive response to surgeries -discussed that she is so happy that she has had both of these done  Prescribing Home Zynex NexWave Stimulator Device and supplies as needed. IFC, NMES and TENS medically necessary Treatment Rx: Daily @ 30-40 minutes per treatment PRN. Zynex NexWave only, no substitutions. Treatment Goals: 1) To reduce and/or eliminate pain 2) To improve functional capacity and Activities of daily living 3) To reduce or prevent the need for oral medications 4) To improve circulation in the injured region 5) To decrease or prevent muscle spasm and muscle atrophy 6) To provide a self-management tool to the patient The patient has not sufficiently improved with conservative care. Numerous studies indexed by Medline and PubMed.gov have shown Neuromuscular, Interferential, and TENS stimulators to reduce  pain, improve function, and reduce medication use in injured patients. Continued use of this evidence based, safe, drug free treatment is both reasonable and medically necessary at this time.    Discussed benefits of sauna -discussed that she is unable to tolerate exercises shoulder  Foods that may reduce pain: 1) Ginger (especially studied for arthritis)- reduce leukotriene production to decrease inflammation 2) Blueberries- high in phytonutrients that decrease inflammation 3) Salmon- marine omega-3s reduce joint swelling and pain 4) Pumpkin seeds- reduce inflammation 5) dark chocolate- reduces inflammation 6) turmeric- reduces inflammation 7) tart cherries - reduce pain and stiffness 8) extra virgin olive oil - its compound olecanthal helps to block prostaglandins  9) chili peppers- can be eaten or applied topically via capsaicin 10) mint- helpful for headache, muscle aches, joint pain, and itching 11) garlic- reduces inflammation 12) Green tea- reduces inflammation and oxidative stress, helps with weight loss, may reduce the risk of cancer, recommend Double Green [place] of Tea daily  Link to further information on diet for chronic pain: http://www.bray.com/  "

## 2024-05-18 ENCOUNTER — Encounter: Admitting: Physical Medicine and Rehabilitation

## 2024-11-01 ENCOUNTER — Ambulatory Visit: Admitting: Rheumatology
